# Patient Record
Sex: Male | Born: 1965
Health system: Southern US, Community
[De-identification: ages and names within clinical notes are randomized; demographics above are authoritative.]

## PROBLEM LIST (undated history)

## (undated) DIAGNOSIS — I2510 Atherosclerotic heart disease of native coronary artery without angina pectoris: Secondary | ICD-10-CM

## (undated) DIAGNOSIS — E669 Obesity, unspecified: Secondary | ICD-10-CM

## (undated) DIAGNOSIS — K219 Gastro-esophageal reflux disease without esophagitis: Secondary | ICD-10-CM

## (undated) DIAGNOSIS — R519 Headache, unspecified: Secondary | ICD-10-CM

## (undated) DIAGNOSIS — E119 Type 2 diabetes mellitus without complications: Secondary | ICD-10-CM

## (undated) DIAGNOSIS — E785 Hyperlipidemia, unspecified: Secondary | ICD-10-CM

## (undated) HISTORY — DX: Obesity, unspecified: E66.9

## (undated) HISTORY — DX: Atherosclerotic heart disease of native coronary artery without angina pectoris: I25.10

## (undated) HISTORY — DX: Type 2 diabetes mellitus without complications: E11.9

## (undated) HISTORY — PX: COLONOSCOPY: SHX174

## (undated) HISTORY — DX: Hyperlipidemia, unspecified: E78.5

---

## 1998-01-09 ENCOUNTER — Encounter: Admission: RE | Admit: 1998-01-09 | Discharge: 1998-04-09 | Payer: Self-pay | Admitting: Internal Medicine

## 2004-04-05 ENCOUNTER — Inpatient Hospital Stay (HOSPITAL_COMMUNITY): Admission: AD | Admit: 2004-04-05 | Discharge: 2004-04-09 | Payer: Self-pay | Admitting: Family Medicine

## 2004-04-05 ENCOUNTER — Ambulatory Visit: Payer: Self-pay | Admitting: Family Medicine

## 2004-04-24 ENCOUNTER — Ambulatory Visit: Payer: Self-pay | Admitting: Internal Medicine

## 2004-05-07 ENCOUNTER — Emergency Department (HOSPITAL_COMMUNITY): Admission: EM | Admit: 2004-05-07 | Discharge: 2004-05-07 | Payer: Self-pay | Admitting: Emergency Medicine

## 2004-06-21 ENCOUNTER — Ambulatory Visit: Payer: Self-pay | Admitting: Internal Medicine

## 2004-06-24 ENCOUNTER — Ambulatory Visit: Payer: Self-pay | Admitting: Internal Medicine

## 2004-07-16 ENCOUNTER — Ambulatory Visit: Payer: Self-pay | Admitting: Internal Medicine

## 2004-07-23 ENCOUNTER — Ambulatory Visit: Payer: Self-pay | Admitting: Internal Medicine

## 2004-07-29 ENCOUNTER — Ambulatory Visit: Payer: Self-pay | Admitting: Internal Medicine

## 2004-11-11 ENCOUNTER — Ambulatory Visit: Payer: Self-pay | Admitting: Internal Medicine

## 2004-11-19 ENCOUNTER — Ambulatory Visit: Payer: Self-pay | Admitting: Internal Medicine

## 2004-12-27 ENCOUNTER — Ambulatory Visit: Payer: Self-pay | Admitting: Internal Medicine

## 2005-04-17 ENCOUNTER — Ambulatory Visit: Payer: Self-pay | Admitting: Internal Medicine

## 2005-04-25 ENCOUNTER — Emergency Department (HOSPITAL_COMMUNITY): Admission: EM | Admit: 2005-04-25 | Discharge: 2005-04-25 | Payer: Self-pay | Admitting: Emergency Medicine

## 2005-04-25 ENCOUNTER — Ambulatory Visit: Payer: Self-pay | Admitting: Cardiology

## 2005-04-28 ENCOUNTER — Ambulatory Visit: Payer: Self-pay

## 2005-04-28 ENCOUNTER — Encounter: Payer: Self-pay | Admitting: Cardiology

## 2005-05-08 ENCOUNTER — Ambulatory Visit: Payer: Self-pay | Admitting: Cardiology

## 2005-05-14 ENCOUNTER — Ambulatory Visit: Payer: Self-pay

## 2005-08-13 ENCOUNTER — Ambulatory Visit: Payer: Self-pay | Admitting: Internal Medicine

## 2005-08-25 ENCOUNTER — Ambulatory Visit: Payer: Self-pay | Admitting: Cardiology

## 2007-01-13 ENCOUNTER — Ambulatory Visit: Payer: Self-pay | Admitting: Cardiology

## 2007-01-20 ENCOUNTER — Encounter: Payer: Self-pay | Admitting: Cardiology

## 2007-01-20 ENCOUNTER — Ambulatory Visit: Payer: Self-pay

## 2007-06-03 ENCOUNTER — Ambulatory Visit: Payer: Self-pay | Admitting: Cardiology

## 2007-10-26 DIAGNOSIS — E669 Obesity, unspecified: Secondary | ICD-10-CM | POA: Insufficient documentation

## 2010-07-23 ENCOUNTER — Encounter: Payer: Self-pay | Admitting: Cardiology

## 2010-07-25 ENCOUNTER — Ambulatory Visit (INDEPENDENT_AMBULATORY_CARE_PROVIDER_SITE_OTHER): Payer: BC Managed Care – PPO | Admitting: Cardiology

## 2010-07-25 ENCOUNTER — Encounter: Payer: Self-pay | Admitting: Cardiology

## 2010-07-25 DIAGNOSIS — Z794 Long term (current) use of insulin: Secondary | ICD-10-CM | POA: Insufficient documentation

## 2010-07-25 DIAGNOSIS — E781 Pure hyperglyceridemia: Secondary | ICD-10-CM

## 2010-07-25 DIAGNOSIS — E119 Type 2 diabetes mellitus without complications: Secondary | ICD-10-CM

## 2010-07-25 DIAGNOSIS — E785 Hyperlipidemia, unspecified: Secondary | ICD-10-CM | POA: Insufficient documentation

## 2010-07-25 DIAGNOSIS — I2510 Atherosclerotic heart disease of native coronary artery without angina pectoris: Secondary | ICD-10-CM

## 2010-07-25 MED ORDER — PRAVASTATIN SODIUM 20 MG PO TABS: 20.0000 mg | ORAL_TABLET | Freq: Every evening | ORAL | Status: DC

## 2010-07-25 NOTE — Patient Instructions (Signed)
Please start Pravastatin 20 mg once a day You are being scheduled for a treadmill test.  Please follow the instruction sheet given. Continue all other medications as listed

## 2010-07-25 NOTE — Assessment & Plan Note (Signed)
The patient needs exercise treadmill testing as it has been several years. This will allow me to rule out new obstructive coronary disease, risk stratify and give him a prescription for exercise.

## 2010-07-25 NOTE — Assessment & Plan Note (Signed)
He reports that his lipids were done by his primary doctor and his LDL was in the 90s. However, I did describe to him the benefits of a statin and I think he should still be on a low dose of pravastatin 20 mg daily for the results described in the Heart Protection Study.

## 2010-07-25 NOTE — Assessment & Plan Note (Signed)
His last hemoglobin A1c was 7.2 and followed by his primary provider. No change in therapy is indicated.

## 2010-07-25 NOTE — Progress Notes (Signed)
HPI The patient presents for followup of his coronary disease. It has been greater than 3 years. He last had a stress test in 2009. He had no ischemia at that time. He says he is done well until 3 weeks ago. He had what he thinks was a viral illness feeling very fatigued and sleeping. After taking a hot shower and felt presyncopal. He did notice some burning lung discomfort. Following this he had some mild episodes of presyncope but never had any frank syncope. He said this has resolved over the last week. He has not had any chest discomfort that had when he had stenting of his right coronary artery in 2006. He does yard work and walks 2-3 times per week. He does not bring on his chest discomfort, neck or arm discomfort. He hasn't had any palpitations. He has not really had any shortness of breath and denies any PND or orthopnea.  Allergies  Allergen Reactions  . Penicillins     Current Outpatient Prescriptions  Medication Sig Dispense Refill  . aspirin 325 MG tablet Take 325 mg by mouth daily.        . fenofibrate (TRICOR) 145 MG tablet Take 145 mg by mouth daily.        Marland Kitchen GLIMEPIRIDE PO Take by mouth. 8mg   daily       . metFORMIN (GLUCOPHAGE) 1000 MG tablet Take 1,000 mg by mouth 2 (two) times daily with a meal.        . Multiple Vitamin (MULTIVITAMIN) tablet Take 1 tablet by mouth daily.        . pioglitazone (ACTOS) 45 MG tablet Take 45 mg by mouth daily.        . sitaGLIPtan (JANUVIA) 100 MG tablet Take 100 mg by mouth daily.          Past Medical History  Diagnosis Date  . CAD (coronary artery disease)     Stent to RCA DES 2006  . Chest pain   . HLD (hyperlipidemia)   . Hypertriglyceridemia   . DM2 (diabetes mellitus, type 2)   . Obesity     History reviewed. No pertinent past surgical history.  Family History  Problem Relation Age of Onset  . Diabetes Mother   . Coronary artery disease Father 46    History   Social History  . Marital Status: Legally Separated   Spouse Name: N/A    Number of Children: N/A  . Years of Education: N/A   Occupational History  . Not on file.   Social History Main Topics  . Smoking status: Never Smoker   . Smokeless tobacco: Not on file  . Alcohol Use: Yes     rare  . Drug Use: No  . Sexually Active: Not on file   Other Topics Concern  . Not on file   Social History Narrative  . No narrative on file    ROS:  As stated in the HPI and negative for all other systems.   PHYSICAL EXAM BP 108/82  Pulse 94  Ht 5\' 5"  (1.651 m)  Wt 188 lb (85.276 kg)  BMI 31.28 kg/m2 GENERAL:  Well appearing HEENT:  Pupils equal round and reactive, fundi not visualized, oral mucosa unremarkable NECK:  No jugular venous distention, waveform within normal limits, carotid upstroke brisk and symmetric, no bruits, no thyromegaly LYMPHATICS:  No cervical, inguinal adenopathy LUNGS:  Clear to auscultation bilaterally BACK:  No CVA tenderness CHEST:  Unremarkable HEART:  PMI not displaced or sustained,S1 and  S2 within normal limits, no S3, no S4, no clicks, no rubs, no murmurs ABD:  Flat, positive bowel sounds normal in frequency in pitch, no bruits, no rebound, no guarding, no midline pulsatile mass, no hepatomegaly, no splenomegaly EXT:  2 plus pulses throughout, no edema, no cyanosis no clubbing SKIN:  No rashes no nodules NEURO:  Cranial nerves II through XII grossly intact, motor grossly intact throughout PSYCH:  Cognitively intact, oriented to person place and time  EKG:  Sinus rhythm, rate 82, axis within normal limits, intervals within normal limits, no acute ST-T wave changes.   ASSESSMENT AND PLAN

## 2010-08-06 NOTE — Assessment & Plan Note (Signed)
[facility]                            CARDIOLOGY OFFICE NOTE   NAME:Danny [name], Danny [name]                        MRN:          [id number]  DATE:01/13/2007                            DOB:          04-Jan-1966    PRIMARY CARE PHYSICIAN:  Danny [name], M.D.   REASON FOR PRESENTATION:  Evaluate patient with coronary disease and  chest discomfort.   HISTORY OF PRESENT ILLNESS:  Patient is a 45 year old gentleman with  coronary disease as described below.  He has had chest discomfort.  This  happened about a week ago.  He describes a substernal discomfort.  It  seems to be somewhat positional.  He has noticed it when he has been  sitting.  He has a fullness in his chest.  He has also had a chest  discomfort across the surface of his chest which is all new.  He does  not bring this on with activity.  He walks every morning and can bring  it on.  It is a vague discomfort.  It seems to be moderate.  There is no  radiation to his jaw or to his arms.  There is no associated nausea,  vomiting or diaphoresis.  He does get somewhat short of breath.  He has  noticed some episodes of dizziness too.  This is unrelated to the chest  discomfort.  This seems to happen every morning.  He has feeling like he  is nearly going to pass out but has not lost consciousness.  Other  complaints include his fingers tingling which may be related to arm  position and how he keeps his arms at his desk.  He has had some  cramping in his feet.  Because of the combination of all these problems,  he presents for follow-up.   He has seen Dr. Lynelle [name] recently and tells me his blood sugar has not been  well controlled.  He does say that his triglycerides have been elevated  but his total cholesterol is only 144.  He has had his diabetes  medicines recently adjusted with the addition of Januvia.  However, he  stopped taking it when he started getting this chest discomfort.   PAST MEDICAL  HISTORY:  1. Coronary artery disease (April 08, 2004, catheterization with      left main normal, LAD normal, circumflex normal, the right coronary      artery had an 80% stenosis which was treated with a CoStar study      stent or a Taxus drug eluting stent.  He is being managed in this      study).  2. Dyslipidemia.  3. Type 2 diabetes mellitus.  4. Obesity.   ALLERGIES:  AMPICILLIN.   MEDICATIONS:  1. Tylenol 25 mg daily.  2. Plavix 75 mg daily.  3. Aspirin 325 mg daily.  4. Glimepiride 4 mg daily.  5. Multivitamin.  6. Glucophage 1000 mg b.i.d.  7. Tricor 67 mg daily.  8. Lipitor 40 mg daily.   REVIEW OF SYSTEMS:  As stated in the HPI and otherwise negative for  other systems.   PHYSICAL EXAMINATION:  VITAL SIGNS:  Blood pressure 112/86, heart rate  66 and regular, weight 212 pounds, body mass index 33.  GENERAL APPEARANCE:  The patient is in no distress.  HEENT:  Eyelids unremarkable.  Pupils are equal, round and reactive to  light.  Fundi not visualized.  Oral mucosa unremarkable.  NECK:  No jugular venous distension at 45 degrees.  Carotid upstrokes  brisk and symmetric, no bruits, no thyromegaly.  LYMPHATICS:  No cervical, axillary or inguinal adenopathy.  CHEST:  Unremarkable.  LUNGS:  Clear to auscultation bilaterally.  BACK:  No costovertebral angle tenderness.  CARDIOVASCULAR:  PMI not displaced or sustained.  S1 and S2 within  normal limits.  No S3, no S4, no clicks, no rubs, no murmurs.  ABDOMEN:  Obese, positive bowel sounds, normal in frequency and pitch,  no bruits, no rebound, no guarding, no midline pulsatile mass, no  hepatomegaly, no splenomegaly.  SKIN:  No rashes, no nodules.  EXTREMITIES:  Pulses 2+, no edema.   EKG:  Sinus rhythm, rate 66, axis within normal limits, intervals within  normal limits, no acute STT wave changes.   ASSESSMENT/PLAN:  1. Chest discomfort:  The patient is having some vague chest      discomfort.  His symptoms when  he presented in 2006 were somewhat      less than classic.  Given this and his previous history, the      possibility of obstructive coronary disease is at least moderate.      Therefore, he needs to be screened with a stress perfusion study.      He will be able to walk on a treadmill, so he will have an exercise      Myoview.  2. Presyncope:  The patient is having this happen daily.  I have asked      him if he can check his blood sugar.  I also wanted him to see if      he could take a blood pressure when that is happening.  I doubt a      primary arrhythmia or other cardiac etiology.  3. Dyslipidemia:  This is being followed closely by Dr. Lynelle [name].  The      goal being LDL less than 70 and HDL greater than 40.  I will defer      to her unless suggested otherwise.  4. Obesity:  He understands that he needs to lose weight with diet and      exercise.  5. Follow-up:  I will see the patient again in four months or sooner      based on the results of his stress test.     Danny Rotunda, MD, [facility]  Electronically Signed    JH/MedQ  DD: 01/13/2007  DT: 01/14/2007  Job #: [id number]   cc:   Danny [name], M.D.

## 2010-08-06 NOTE — Assessment & Plan Note (Signed)
[facility]                            CARDIOLOGY OFFICE NOTE   NAME:Danny [name], Danny [name]                        MRN:          [id number]  DATE:06/03/2007                            DOB:          1966-01-14    PRIMARY:  None.   REASON FOR PRESENTATION:  Evaluate the patient for coronary disease.   HISTORY OF PRESENT ILLNESS:  The patient is a pleasant 45 year old  gentleman with coronary disease as described below.  At the last visit,  he did describe some chest discomfort.  He had a stress perfusion study  in October which demonstrated no evidence of ischemia or infarct and EF  of 72%.  He has had no further problems with chest discomfort.  He has  had a dry hacking cough.  Because of this, he went to an emergency room.  He was given some antibiotics.  His pharmacist suggested that he come  off his Lipitor while taking antibiotics.  When he stopped taking his,  he stopped having leg cramping, which was a significant problem.  He had  no muscle aches.  He has since remained off the Lipitor.  He has had no  chest pressure, neck or arm discomfort.  Had no palpitations,  presyncope, syncope.  Had no PND or orthopnea.  He does such activities  and as walking 3 flight of stairs without significant symptoms.   PAST MEDICAL HISTORY:  Coronary artery disease (April 08, 2004  catheterization was normal left main, LAD normal, circumflex normal, the  right coronary artery had 80% stenosis which was treated with a COSTAR  study with either a TAXUS stent or COSTAR study stent.  He is being  managed in this study.), dyslipidemia, type 2 diabetes mellitus,  obesity.   ALLERGIES:  PENICILLIN.   MEDICATIONS:  1. Atenolol 25 mg daily.  2. Plavix 75 mg daily.  3. Aspirin 325 mg daily.  4. Glimepiride 4 mg daily.  5. Multivitamin.  6. Januvia.  7. Glucophage 1000 mg b.i.d.  8. Tricor 67 mg daily.   REVIEW OF SYSTEMS:  As stated in the HPI, otherwise negative for  other  systems.   PHYSICAL EXAMINATION:  The patient is in no distress.  Blood pressure 120/73, heart rate 77 regular, weight 207 pounds, body  mass index 33.  HEENT:  Eyelids unremarkable.  Pupils are equal, round, and reactive to  light and accommodation.  Fundi are not visualized.  Oral mucosa  unremarkable.  NECK:  No jugular venous distension at 45 degrees, carotid upstroke  brisk and symmetric, no bruits, thyromegaly.  LYMPHATICS:  No cervical, axillary, or inguinal adenopathy.  LUNGS:  Clear to auscultation bilaterally.  BACK:  No costovertebral angle tenderness.  CHEST:  Unremarkable.  HEART:  PMI not displaced or sustained, S1 and S2 within normal limits,  no S3, no S4, no clicks, rubs, murmurs.  ABDOMEN:  Obese, positive bowel sounds, normal in frequency and pitch,  no bruits, rebound, guarding.  No midline pulsatile mass, hepatomegaly,  splenomegaly.  SKIN:  No rashes, no nodules.  EXTREMITIES:  With 2+ pulses throughout,  no edema, cyanosis, clubbing.  NEURO:  Oriented to person, place, and time, cranial nerves 2-12 grossly  intact, motor grossly intact.    EKG sinus rhythm, rate 72, axis within normal.  Intervals within normal.  No acute ST wave change.   ASSESSMENT/PLAN:  1. Coronary disease.  The patient is having no new symptoms.  No      further cardiovascular testing is suggested.  Will concentrate on      secondary risk reduction.  2. Dyslipidemia.  We had a long discussion about this.  The patient      will be given a prescription for Crestor.  He wants to get a      fasting lipid profile checked off of drugs first and have me review      this.  However, I think I convinced him of the benefits of a statin      in his situation.  He will keep the Crestor prescription and will      discuss starting this.  He understands the when he does start it,      he will need lipid and liver enzymes about 10 weeks later.  3. Obesity.  Understands the need to lose weight  with diet and      exercise, and that his body mass index 33 puts him in the obese      range.  4. Hypertension.  Blood pressure is well-controlled and he will      continue the medications as listed.  5. Diabetes.  He is establishing with a new primary care in [facility].  They will follow his diabetes.  6. Followup.  See the patient again in about 1 year or sooner and in      the meantime I will be evaluating the lipids and liver enzymes as      reported above.     [name], MD, [facility]  Electronically Signed    JH/MedQ  DD: 06/03/2007  DT: 06/04/2007  Job #: [id number]

## 2010-08-09 NOTE — Consult Note (Signed)
Danny [name], Danny [name]                 ACCOUNT NO.:  [id number]   MEDICAL RECORD NO.:  [id number]          PATIENT TYPE:  EMS   LOCATION:  [facility]                         FACILITY:  [facility]   PHYSICIAN:  [name], M.D.   DATE OF BIRTH:  [birth year]   DATE OF CONSULTATION:  04/25/2005  DATE OF DISCHARGE:                                   CONSULTATION   PRIMARY CARE PHYSICIAN:  Titus Dubin. Alwyn Ren, M.D. but will be Lavonda Jumbo  with [facility].   PRIMARY CARDIOLOGIST:  [name], M.D.   REASON FOR CONSULTATION:  This is a 45 year old white male with a prior  history of coronary artery disease, who presents for recurrent atypical  chest pain.   PROBLEM:  1.  Coronary artery disease/chest pain      1.  On April 08, 2004, a cardiac catheterization with left main          normal, LAD normal, left circumflex normal.  RCA with 80% mid.  The          patient was enrolled in the COSTAR study and was randomized to          either a COSTAR study stent or a Taxus drug-eluting stent.  The          stent he received was a 3.5 mm x 24 mm.  The ejection fraction was          60%.  2.  Hyperlipidemia.  3.  Hypertriglyceridemia.      1.  Last lipid profile on November 11, 2004, total cholesterol 180,          triglycerides 266, HDL 23, direct LDL 108.5.  4.  Hypertriglyceridemia.  5.  Type 2 diabetes mellitus.  6.  Obesity.   HISTORY OF PRESENT ILLNESS:  This 45 year old white male with a history of  coronary artery disease, status post PCI and stenting of the RCA on April 08, 2004, with a drug-eluting stent, enrolled in the COSTAR trial.  He was  in his usual state of health until approximately two weeks ago, when he  noted a cough with occasional episodes of a sensation that he could not get  his breath, with associated light-headedness lasting for a few minutes and  resolving spontaneously.  His symptoms typically occurred at rest while  driving and never occurred with  exertion.  He notes that he is able to walk  up and down 10 steps in his house without any problems.  On April 22, 2005,  he began to experience left-sided intermittent sharp shooting chest  pains lasting for two to three seconds and recurring every five minutes.  These pains occurred all day and did not limit his activities any.  He did  not have the discomfort on April 23, 2005, but decided to be seen by an  Urgent Care, where he had an electrocardiogram and chest x-ray, both of  which were okay.  He was given a prescription for his cough and sent home.  This a.m. while driving to get coffee, he developed 3-4/10  dull retrosternal  aching without associated symptoms, which was worse with twisting or leaning  forward.  He got to the coffee shop and got out of his car, walked into the  coffee shop without any worsening of discomfort.  He walked out of the  coffee shop and back into his truck, and then had a large belch, which was  associated with epigastric burning and then subsequently all of his symptoms  resolved.  He decided at that point to the emergency room for further  evaluation.  In the emergency room he is pain-free, with the exception of  when he leans forward, he has an epigastric discomfort, similar to what he  was having in his truck.  Cardiac markers are negative x3.  His  electrocardiogram shows no acute changes.   ALLERGIES:  No known drug allergies.   HOME MEDICATIONS:  1.  Aspirin 325 mg daily.  2.  Plavix 75 mg daily.  3.  Atenolol 25 mg daily.  4.  Avandia 8 mg daily.  5.  Tri-Chlor 145 mg daily.  6.  Multivitamin daily.  7.  Glimepiride 4 mg daily.  8.  Glucophage 1000 mg daily.   FAMILY HISTORY:  Mother is age 35, with diabetes.  Father is age 58, with a  history of diabetes and a coronary artery bypass graft surgery.  He has a  brother who is alive and well.   SOCIAL HISTORY:  He lives in [place] by himself.  He works as a Occupational psychologist.  He has never used cigarettes.  Occasionally drinks  alcohol.  He denies any use of drugs.  He was exercising, but stopped as the  weather turned cold in December.   REVIEW OF SYSTEMS:  Positive for chest pain, shortness of breath and  lightheadedness as well as anxiety.  All other systems reviewed and are  negative.   PHYSICAL EXAMINATION:  VITAL SIGNS:  Temperature 98.2 degrees, heart rate  64, respirations 20, blood pressure 102/85.  Pulse oximetry 98% on room air.  GENERAL:  A pleasant white male, in no acute distress, awake, alert and  oriented x3.  NECK:  Normal carotid upstrokes.  No bruits or jugular venous distention.  LUNGS:  Respirations regular and unlabored.  Clear to auscultation.  HEART:  Regular S1, S2.  No S3, S4 or murmurs.  ABDOMEN:  Round, soft, non-distended, nontender.  Bowel sounds present x4.  EXTREMITIES:  Warm and dry and patent with no clubbing, cyanosis or edema.  Dorsalis pedis and posterior tibial pulses 2+ and equal bilaterally.   ACCESSORY CLINICAL FINDINGS:  Chest x-ray showed no active cardiopulmonary  disease.  A chest CT was negative for pulmonary embolism or other acute  findings.   Electrocardiogram shows sinus rhythm with a rate of 60, normal axis.  There  are no acute ST-T changes.   LABORATORY DATA:  Hemoglobin 13.3, hematocrit 39.2.  Sodium 139, potassium  4.4, chloride 109, CO2 of 23.6, BUN 20, creatinine pending, glucose 124.  Cardiac markers:  CK-MB 1.5, 1.2, 1.2.  Troponin less than 0.05, less than  0.05, less than 0.05.  PTT 28, PT 12.7, INR 0.9.   ASSESSMENT/PLAN:  1.  Chest pain with a history of coronary artery disease:  His chest pain is      very atypical.  It is worse with positioning and leaving forward.  He is      now pain-free except for when leaning forward.  He is fairly anxious  regarding his history of coronary artery disease and feeling as though     any day could be my last.  Cardiac markers are  negative x3 since 8:30      a.m. this morning.  His electrocardiogram is without acute changes.  We      plan on discharge from the emergency department with outpatient function      study, set up for April 28, 2005, at 12:30 p.m.  He will then be seen      by Dr. [name] on May 08, 2005, at 4 p.m. in our office.  2.  Hyperlipidemia/hypertriglyceridemia:  He remains on Tri-Chlor therapy.      We have made a referral to our      [facility] and an appointment has been established for May 08, 2005, at 3:15 p.m.  3.  Type 2 diabetes mellitus:  The patient is planning on changing his      primary care physician to Dr. Lavonda Jumbo with [facility]      and hopes to follow up with a Dr. Talmage Nap there, with regards to his      endocrine issues.      Ok Anis, NP    ______________________________  [name], M.D.    CRB/MEDQ  D:  04/25/2005  T:  04/25/2005  Job:  [id number]   cc:   [name], M.D.  [address]  [address]  [place]  [place] [address]   Lavonda Jumbo, M.D.  Fax: [phone]   Dr. Talmage Nap

## 2010-08-09 NOTE — Cardiovascular Report (Signed)
NAMEJAMIEON, LANNEN                 ACCOUNT NO.:  [id number]   MEDICAL RECORD NO.:  [id number]          PATIENT TYPE:  INP   LOCATION:  3739                         FACILITY:  [facility]   PHYSICIAN:  Charlies Constable, M.D. [facility] DATE OF BIRTH:  12-10-1965   DATE OF PROCEDURE:  04/08/2004  DATE OF DISCHARGE:                              CARDIAC CATHETERIZATION   CLINICAL HISTORY:  Mr. 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  There is no prior history of  known heart disease, but does have diabetes, hyperlipidemia, and a positive  family history for coronary heart disease.  He recently developed exertional  dyspnea, chest tightness, and symptoms at rest, and was admitted to the  hospital by Dr. Alwyn Ren with unstable angina and seen in consultation by Dr.  Antoine Poche and scheduled for evaluation with angiography.   PROCEDURE:  The procedure was performed via the right femoral artery using  an arterial sheath and 6-French preformed  coronary catheters.  A front wall  arterial puncture was performed and Omnipaque contrast was used.  After  completion of the diagnostic study, we made a decision to proceed with  intervention of the right coronary artery.   The patient was enrolled in the COSTAR trial and was randomized to view the  COSTAR or Taxus stent.  He was given Angiomax bolus and infusion and was  given 300 mg of Plavix.  We used the [facility] guiding catheter with side holes  and  an Office manager.  We passed these in the mid right center with  the wire without difficulty.  We predilated with a 3.0 x 20 mm Maverick  performing one inflation up to eight atmospheres for 30 seconds.  We then  deployed a 3.5 x 24 mm COSTAR study stent employing this with one inflation  up to 14 atmospheres for 30 seconds.  We then pulse dilated with a 3.75 x 20  mm Quantum Maverick performing two inflations up to 16 atmospheres for 30  seconds.  Repeat dye  studies were then performed through the guiding  catheter.  We crossed the right ventricular branch, but did not compromise  the flow in this branch with the stent procedure.   RESULTS:  The left main coronary orifices and left main coronary artery was  free of significant disease.   The left anterior descending artery gave rise to two diagonal branches and a  septal perforator.  The other was irregular, but there was no significant  obstruction.   The circumflex artery gave rise to a marginal branch, atrial branch, and the  posterior lateral branch.  These vessels were free of significant disease.   The right coronary artery is a moderately large vessel, gave rise to a right  ventricular branch, posterior descending branch, and the posterior lateral  branch.  There was 80% narrowing in the mid proximal to midportion of the  right coronary artery just after the right ventricular branch with segmental  disease that covered about 20 mm which was less tight.  This vessel was free  of major obstruction.   The left ventriculogram performed in the RAO projection showed good wall  motion with no areas of hypokinesis.  The estimated ejection fraction was 60%.   Following stenting of the lesion in the mid-right coronary artery stenosis  improved from 80% to less than 10%.   CONCLUSION:  1.  Coronary artery disease with 80% narrowing in the mid-right coronary      artery, no significant obstruction in the left anterior descending      artery and circumflex artery with normal LV function.  2.  Successful stenting of the mid-right coronary artery lesion using a      COSTAR drug-eluting study stent with improvement in narrowing from 80%      to less than 10%.   DISPOSITION:  The patient was returned to the post angio suite for further  observation.  The patient does not follow-up angiography as part of the  COSTAR study stent, but will be followed clinically.   ADDENDUM:  The aortic  pressure was 117/76 with a mean of 92, left  ventricular pressure was 117/8.       BB/MEDQ  D:  04/08/2004  T:  04/08/2004  Job:  [id number]   cc:   Titus Dubin. Alwyn Ren, M.D. [facility]   [name], M.D.

## 2010-08-09 NOTE — Cardiovascular Report (Signed)
Danny [name], Danny [name]                 ACCOUNT NO.:  [id number]   MEDICAL RECORD NO.:  [id number]          PATIENT TYPE:  INP   LOCATION:  3735                         FACILITY:  [facility]   PHYSICIAN:  [name], M.D.   DATE OF BIRTH:  1965/04/24   DATE OF PROCEDURE:  DATE OF DISCHARGE:                              CARDIAC CATHETERIZATION   PRIMARY PHYSICIAN:  Marga Melnick, M.D.   REASON FOR CONSULTATION:  Evaluation of patient's chest pain.   HISTORY OF PRESENT ILLNESS:  The patient is a 45 year old gentleman with  multiple cardiovascular risk factors.  He had a negative stress perfusion  study he reports approximately seven or eight years ago.  Since then, he has  had no further cardiac evaluation.  He has had no cardiac symptoms.  He has  had ongoing risk factors with poor compliance for diabetes management with a  resultant hemoglobin A1C of 9.4.  He has had poor compliance with lipid  management with a cholesterol of 309, triglycerides 691, HDL 21, and an LDL  of 142.   Three nights ago, he noticed increasing shortness of breath while doing such  activities as climbing a flight of stairs.  This was distinctly different.  Around that same time, he started noticing chest pressure.  This would be  intermittent.  It was not like his previous reflux.  It was substernal.  It  would go away with rest.  It was mild.  There was no radiation to his neck  or to his arms.  There was no associated nausea, vomiting, or diaphoresis.  He did have dyspnea as described.  This morning, he awoke.  He thinks he  might have been startled by a sound.  He noticed the same kind of chest  pressure.  He saw Dr. Ruthine Dose today, who referred him to the hospital for  probable unstable angina.   The patient walks three times per week and the last time was a week ago  today.  He had no discomfort with this.  He tries to watch his diet.  He is  currently taking his medications and seems to be more in tune with  primary  risk reduction than he has been previously.   PAST MEDICAL HISTORY:  Diabetes x10 years (hemoglobin A1C 9.4), dyslipidemia  (cholesterol 209, triglycerides 691, HDL 21, and LDL of 142).   PAST SURGICAL HISTORY:  None.   ALLERGIES:  None.   CURRENT MEDICATIONS:  1.  Glucophage 1000 mg b.i.d.  2.  Amaryl 4 mg daily.  3.  Avandia 8 mg daily.  4.  Tricor 160 mg daily.  5.  Aspirin 325 mg daily.   SOCIAL HISTORY:  The patient is separated.  He has no children.  He has a  girlfriend.  He works in Scientist, research (physical sciences).  He was in the [employer].  He  never smoked cigarettes and occasionally drinks alcohol.   FAMILY HISTORY:  Remarkable for his father having a myocardial infarction  and CABG at age 25.   REVIEW OF SYSTEMS:  As stated in the HPI, positive for occasional reflux,  negative for all other systems.   PHYSICAL EXAMINATION:  He is in no distress.  Blood pressure 135/81, heart  rate 68 and regular, afebrile, 97% saturation on room air.  HEENT:  Eyes  unremarkable.  Pupils are equal, round, and reactive to light.  Fundi not  visualized.  Oral mucosa unremarkable.  Neck:  No jugular venous distention.  Waveform within normal limits, carotid upstroke brisk and symmetric, no  bruits, no thyromegaly.  Lymphatics:  No cervical, axillary, or inguinal  adenopathy.  Lungs:  Clear to auscultation bilaterally.  Back:  No  costovertebral angle tenderness.  Chest:  Unremarkable.  Heart:  PMI not  displaced or sustained, S1 and S2 within normal limits, no S3, no S4, no  murmurs.  Abdomen:  Obese, positive bowel sounds, normal in frequency.  No  bruits, no rebound, no guarding on midline.  No organomegaly or  splenomegaly.  Skin:  No rashes, no nodules.  Extremities:  2+ pulses  throughout, no edema, no cyanosis, no clubbing.  Neurologic:  Oriented to  person, place, and time.  Cranial nerves II-XII grossly intact, motor  grossly intact.   ELECTROCARDIOGRAM:  EKG:  Sinus rhythm, rate  65, axis within normal limits,  intervals within normal limits, no ST-T wave changes.   LABORATORY DATA:  Pending.  Chest x-ray pending.   ASSESSMENT/PLAN:  1.  Chest discomfort.  The patient's chest discomfort is worrisome for      unstable angina.  He has multiple cardiovascular risk factors.  He will      be admitted to the hospital and monitored on telemetry.  He will be      ruled out for myocardial infarction.  He will be treated with heparin      and beta blockers.  He will have elective cardiac catheterization unless      he has more urgent symptoms.  2. Diabetes.  He will continue on his oral      medications.  Will get a dietary consult.  He will be treated by his      primary care service.  3. Dyslipidemia per primary care.  However, given      his risk factors, we would suggest the addition of a statin to his      current regimen.  4. Obesity.  We talked about diet and exercise for      weight loss.       JH/MEDQ  D:  04/05/2004  T:  04/05/2004  Job:  [id number]

## 2010-08-09 NOTE — H&P (Signed)
Danny [name], Danny [name]                 ACCOUNT NO.:  [id number]   MEDICAL RECORD NO.:  [id number]          PATIENT TYPE:  INP   LOCATION:                               FACILITY:  [facility]   PHYSICIAN:  Angelena Sole, M.D. [facility] DATE OF BIRTH:  03-31-1965   DATE OF ADMISSION:  04/05/2004  DATE OF DISCHARGE:                                HISTORY & PHYSICAL   TIME:  8:00 a.m.   CHIEF COMPLAINT:  Chest pain, shortness of breath.  I think I'm having a  heart attack.   HISTORY OF PRESENT ILLNESS:  The patient is a 45 year old diabetic white  male who walked into the office at 7:45 a.m. this morning with the above  complaint.  For the past two to three days, he has had intermittent pressure  in his chest, more in the center lower part of his chest.  It does not  radiate.  No shortness of breath when it is happening.  Not sweating.  It  lasts a few minutes.  He can be at rest or if he is active, but at other  times, he has pain in his left neck and sometimes in his back.  Right now,  he is having pain.  He also had dyspnea on exertion for two days, and it is  only on exertion.  No cough, no fevers or chills, and he has been very  fatigued for past two days.  He has also had a headache for two to three  weeks, the left occipital area and right frontal area.  No other symptoms  with that, but that has resolved for the past several days, and he states  that his sugars are not good at all.  He took an aspirin about 3:00 a.m.  or 4:30 a.m. this morning, and he has been moving computers a lot at work.   PAST MEDICAL HISTORY:  1.  Type 2 diabetes.  2.  Hyperlipidemia.  3.  High triglycerides.  4.  Hyperhomocysteinemia.  5.  Gilbert's syndrome.   PAST SURGICAL HISTORY:  None.   ALLERGIES:  AMPICILLIN caused GI upset.   MEDICATIONS:  1.  Tricor 145 mg.  2.  Avandia 8 mg.  3.  Glucophage 1000 mg b.i.d.  4.  Aspirin 325 mg.  5.  Amaryl 4 mg.  6.  Neuro-PS over the counter.  7.  He is supposed to  be on a statin, but he is having insurance issues and      trouble getting it.   FAMILY HISTORY:  Positive for diabetes, colon cancer.  His dad had coronary  artery disease at age 41-ish.   REVIEW OF SYSTEMS:  Negative except as in HPI.   PHYSICAL EXAMINATION:  VITAL SIGNS:  Weight 200, pulse 64, respirations 12,  blood pressure 110/78, pulse oximetry 96-97%.  NECK:  Supple.  No JVD.  No carotid bruits.  No thyromegaly.  No  lymphadenopathy.  HEART:  Regular rate and rhythm.  S1, S2 positive.  No murmurs, rubs or  gallops.  No murmurs or pain with leaning forward.  LUNGS:  Clear to auscultation and percussion bilaterally.  CHEST:  There is no pain to palpation of his upper chest.  ABDOMEN:  Bowel sounds positive, soft.  Tender in his midepigastric region.  No pulsatile masses were felt.  He is slightly tender all over his belly.  RECTAL:  Good tone.  Brown stool.  Guaiac negative.  EXTREMITIES:  No edema.  Dorsalis pedis and radial pulses 2+ bilaterally.  NEURO:  Alert and oriented x 3.  SKIN:  Patient has some tattoos.   EKG:  Normal sinus rhythm with a rate of 65.  No acute ST/T wave changes.  Some mild inversions in lead III.   IMPRESSION:  1.  Chest pain, dyspnea on exertion and fatigue. Given patient's risks      factors which include uncontrolled diabetes, hyperlipidemia,      hyperhomocysteinemia, family history, we will admit the patient and rule      out myocardial infarction.  We will also placed him on a proton pump      inhibitor.  2.  Diabetes, uncontrolled on p.o. medications.  Due to the fact that he may      end up having to have some dye studies done, etc., we will hold his      Glucophage.  We will get diabetic educator to come in and evaluate the      patient and teach him and start him on Lantus.  We will continue his      Avandia for now and put him on a sliding-scale insulin.        ___________________________________________  Angelena Sole, M.D.  [facility]    AMK/MEDQ  D:  04/05/2004  T:  04/05/2004  Job:  [id number]

## 2010-08-09 NOTE — Discharge Summary (Signed)
Danny [name], Danny [name]                 ACCOUNT NO.:  [id number]   MEDICAL RECORD NO.:  [id number]          PATIENT TYPE:  INP   LOCATION:  6533                         FACILITY:  [facility]   PHYSICIAN:  Charlies Constable, M.D. [facility] DATE OF BIRTH:  October 10, 1965   DATE OF ADMISSION:  04/05/2004  DATE OF DISCHARGE:  04/09/2004                                 DISCHARGE SUMMARY   BRIEF HISTORY OF PRESENT ILLNESS:  This is a 45 year old male with no  previous history of coronary artery disease but multiple cardiac risk  factors including diabetes mellitus, elevated cholesterol and positive  family history for early coronary artery disease. The patient was admitted  to [facility] on April 05, 2004 with a two to three day  history of dyspnea on exertion as well as orthopnea.  He also noted some  anterior chest discomfort.  He was admitted for further evaluation.   PAST MEDICAL HISTORY:  Significant for negative exercise Cardiolite  approximately 8 years ago done in [place].  He does have diabetes X10  years, history of elevated cholesterol, positive family history of early  coronary artery disease.   ALLERGIES:  No known drug allergies.   MEDICATIONS:  Prior to admission included Glucophage, Amaryl, Avandia,  Tricor and aspirin.   SOCIAL HISTORY:  The patient lives in [place].  He is separated.  He  works in Scientist, research (physical sciences).  He has never been a smoker.  He drinks alcohol  occasionally.   FAMILY HISTORY:  His father is age 36.  He had coronary artery bypass  grafting surgery at age 15.  He also has diabetes.  His mother is age 9.  She does not have any coronary disease.  He has a brother, 48, with no  coronary disease.   HOSPITAL COURSE:  As noted, this patient was admitted to [facility]  with dyspnea and chest pain.  He was placed on intravenous heparin at the  time of admission as well as aspirin and Lopressor.   The patient was actually admitted by Dr. Ruthine Dose  and [facility] saw  the patient in consultation.   On April 08, 2004 the patient underwent cardiac catheterization performed  by Dr. Charlies Constable.  He was found to have mild irregularities in the left  anterior descending, the circumflex artery was okay, the right coronary  artery had an 80% proximal.  The left ventricle was normal with an ejection  fraction of 60%.   The patient underwent percutaneous transluminal coronary angioplasty  stenting of the right coronary artery reducing the lesion from 80% to less  than 10%.  The patient tolerated this well.  Arrangements were made to  discharge the patient home in stable and improved condition on April 09, 2004.   LABORATORY DATA:  On the day of discharge a CBC revealed hemoglobin of 12.7,  hematocrit 35.5, white blood cell count 7.6, platelet count 259,000.  Chemistries on the day of discharge revealed BUN of 12, creatinine 0.7,  potassium 3.6, sodium 134, glucose 142.  Cardiac enzymes were negative.  Hemoglobin A1C was  elevated at 9.6.  Stool for occult blood was negative.  A  lipid profile revealed cholesterol 367, triglycerides 1,388, HDL was 28, LDL  was not tested due to the elevated triglycerides.  A direct LDL was  suggested.  A chest x-ray showed no acute abnormalities.  Ultrasound of the  abdomen showed no significant abnormalities.  Liver function tests were  mildly elevated with AST of 45, ALT 61.  This did improve with the AST  improving to 36, ALT improving to 56.   DISCHARGE MEDICATIONS:  1.  Plavix 75 mg daily for at least six months.  2.  Coated aspirin 325 mg daily for at least 8 months.  3.  Nitroglycerin PRN.  4.  Amaryl 4 mg daily.  5.  Glucophage to be resumed tomorrow at his previous dosing.  6.  Tricor 145 mg daily.  7.  Atenolol 25 mg daily.  8.  Tylenol as needed for pain.   DISCHARGE INSTRUCTIONS:  The patient was told to avoid any heavy  lifting  for at least two days.  He is told he can  drive the day after discharge.  He  was told to stay on a low fat, low cholesterol diabetic diet.  He is told to  call the office if he has any increased pain, swelling or bleeding from his  groin.   The patient was enrolled on the Costar's stent study which recommends  aspirin for at least 8 months and Plavix for at least six months.   The patient is to follow up with Dr. Antoine Poche April 25, 2004 at 10:45 A.M.  He will call his primary care physician for a follow up appointment.   PROBLEM LIST AT TIME OF DISCHARGE:  1.  Status post percutaneous transluminal coronary angioplasty stenting of      the right coronary artery as described above, performed April 08, 2004      with no other significant lesions.  Ejection fraction is 60% at time of      catheterization.  2.  History of diabetes mellitus X10 years.  3.  Hyperlipidemia with significantly elevated triglycerides.  4.  Positive family history of early coronary artery disease.  5.  History of gastroesophageal reflux disease.  6.  Mildly low potassium supplemented prior to discharge.       DR/MEDQ  D:  04/09/2004  T:  04/09/2004  Job:  [id number]   cc:   Titus Dubin. Alwyn Ren, M.D. [facility]

## 2010-09-13 ENCOUNTER — Ambulatory Visit (INDEPENDENT_AMBULATORY_CARE_PROVIDER_SITE_OTHER): Payer: BC Managed Care – PPO | Admitting: Cardiology

## 2010-09-13 DIAGNOSIS — I2510 Atherosclerotic heart disease of native coronary artery without angina pectoris: Secondary | ICD-10-CM

## 2010-09-13 NOTE — Progress Notes (Signed)
Exercise Treadmill Test  Pre-Exercise Testing Evaluation Rhythm: normal sinus  Rate: 80   PR:  .15 QRS:  .08  QT:  .36 QTc: .42     Test  Exercise Tolerance Test Ordering MD: Angelina Sheriff, MD  Interpreting MD:  Angelina Sheriff, MD  Unique Test No: 1  Treadmill:  1  Indication for ETT: CAD  Contraindication to ETT: No   Stress Modality: exercise - treadmill  Cardiac Imaging Performed: non   Protocol: standard Bruce - maximal  Max BP:  157/72  Max MPHR (bpm):  176 85% MPR (bpm):  150  MPHR obtained (bpm):  153 % MPHR obtained:  87  Reached 85% MPHR (min:sec): 8:13 Total Exercise Time (min-sec):  9:00  Workload in METS:  10.1 Borg Scale: 17  Reason ETT Terminated:  desired heart rate attained    ST Segment Analysis At Rest: normal ST segments - no evidence of significant ST depression With Exercise: no evidence of significant ST depression  Other Information Arrhythmia:  No Angina during ETT:  absent (0) Quality of ETT:  diagnostic  ETT Interpretation:  normal - no evidence of ischemia by ST analysis  Comments: The patient had an excellent exercise tolerance.  There was no chest pain.  There was an appropriate level of dyspnea.  There were no arrhythmias, a normal heart rate response and normal BP response.  There were no ischemic ST T wave changes and a normal heart rate recovery.  Recommendations: Negative adequate ETT.  No further testing is indicated.  Based on the above I gave the patient a prescription for exercise.

## 2011-04-09 DIAGNOSIS — E119 Type 2 diabetes mellitus without complications: Secondary | ICD-10-CM | POA: Insufficient documentation

## 2012-05-06 ENCOUNTER — Ambulatory Visit: Payer: BC Managed Care – PPO | Admitting: Nurse Practitioner

## 2012-05-11 ENCOUNTER — Ambulatory Visit (INDEPENDENT_AMBULATORY_CARE_PROVIDER_SITE_OTHER): Payer: PRIVATE HEALTH INSURANCE | Admitting: Physician Assistant

## 2012-05-11 ENCOUNTER — Telehealth: Payer: Self-pay | Admitting: *Deleted

## 2012-05-11 ENCOUNTER — Encounter: Payer: Self-pay | Admitting: Physician Assistant

## 2012-05-11 ENCOUNTER — Encounter: Payer: Self-pay | Admitting: *Deleted

## 2012-05-11 VITALS — BP 128/86 | HR 92 | Ht 65.0 in | Wt 190.0 lb

## 2012-05-11 DIAGNOSIS — E119 Type 2 diabetes mellitus without complications: Secondary | ICD-10-CM

## 2012-05-11 DIAGNOSIS — I209 Angina pectoris, unspecified: Secondary | ICD-10-CM

## 2012-05-11 DIAGNOSIS — E785 Hyperlipidemia, unspecified: Secondary | ICD-10-CM

## 2012-05-11 DIAGNOSIS — I2510 Atherosclerotic heart disease of native coronary artery without angina pectoris: Secondary | ICD-10-CM

## 2012-05-11 DIAGNOSIS — I208 Other forms of angina pectoris: Secondary | ICD-10-CM

## 2012-05-11 LAB — CBC WITH DIFFERENTIAL/PLATELET
Basophils Relative: 0.3 % (ref 0.0–3.0)
Eosinophils Relative: 3.4 % (ref 0.0–5.0)
HCT: 41.8 % (ref 39.0–52.0)
Hemoglobin: 14.5 g/dL (ref 13.0–17.0)
Lymphocytes Relative: 30.6 % (ref 12.0–46.0)
Lymphs Abs: 3 10*3/uL (ref 0.7–4.0)
Monocytes Relative: 5.7 % (ref 3.0–12.0)
Neutro Abs: 5.9 10*3/uL (ref 1.4–7.7)
RBC: 4.83 Mil/uL (ref 4.22–5.81)
WBC: 9.9 10*3/uL (ref 4.5–10.5)

## 2012-05-11 LAB — BASIC METABOLIC PANEL
CO2: 24 mEq/L (ref 19–32)
Chloride: 102 mEq/L (ref 96–112)
Creatinine, Ser: 0.8 mg/dL (ref 0.4–1.5)
Potassium: 4.6 mEq/L (ref 3.5–5.1)
Sodium: 136 mEq/L (ref 135–145)

## 2012-05-11 LAB — PROTIME-INR
INR: 1 ratio (ref 0.8–1.0)
Prothrombin Time: 10.3 s (ref 10.2–12.4)

## 2012-05-11 MED ORDER — NITROGLYCERIN 0.4 MG SL SUBL: 0.4000 mg | SUBLINGUAL_TABLET | SUBLINGUAL | Status: DC | PRN

## 2012-05-11 NOTE — Patient Instructions (Addendum)
Your physician has requested that you have a cardiac catheterization LEFT HEART CATH WITH DR. HOCHREIN IN THE [facility] 05/19/12 @ 10 AM; DX 413.9, 414.01, 272.4. Cardiac catheterization is used to diagnose and/or treat various heart conditions. Doctors may recommend this procedure for a number of different reasons. The most common reason is to evaluate chest pain. Chest pain can be a symptom of coronary artery disease (CAD), and cardiac catheterization can show whether plaque is narrowing or blocking your heart's arteries. This procedure is also used to evaluate the valves, as well as measure the blood flow and oxygen levels in different parts of your heart. For further information please visit [email or web address] Please follow instruction sheet, as given.  Your physician recommends that you return for lab work in: TODAY PRE CATH LABS ; BMET, CBC W/DIFF, PT/INR  AN RX FOR NITROGLYCERIN HAS BEEN SENT IN TODAY AND YOU HAVE BEEN ADVISED AND EDUCATED AS TO HOW AND WHEN TO USE NTG

## 2012-05-11 NOTE — Progress Notes (Addendum)
[address] [place], [place]  [address] Phone: [phone], Fax:  [phone]  Date:  05/11/2012   ID:  KNOWLEDGE ESCANDON, DOB Mar 07, 1966, MRN [id number]  PCP:  Dr. Carlyle Dolly at [facility] in [place], [place]  Primary Cardiologist:  Dr. [name]     History of Present Illness: Danny [name] is a 47 y.o. male who returns for evaluation of chest pain.  He has a history of CAD, status post Costar study DES of the RCA in 2006, DM2, HL.  LHC 1/06: Mid RCA 80% (tx with 3.5x29mm COSTAR study DES), EF 60%.  Myoview 10/08:  No ischemia, EF 72%.  ETT 6/12:  Ex 9:00, no ischemic changes.   Not seen here since 2012.  He established with Dr. Ramond Marrow at [facility] b/c this was closer to his job.  However, he preferred to come back here.  He notes exertional chest burning for the last 3-4 weeks.  No associated symptoms.  No dyspnea.  He notes his discomfort worsens the more activity he does and resolves with rest promptly.  He denies rest symptoms.  Has not taken NTG.  He is planning on starting to hike [place] soon.    Wt Readings from Last 3 Encounters:  05/11/12 190 lb (86.183 kg)  07/25/10 188 lb (85.276 kg)     Past Medical History  Diagnosis Date  . CAD (coronary artery disease)     a. COSTAR study DES Stent to RCA 2006;  b. Myoview 10/08:  No ischemia, EF 72%.;  c. ETT 6/12:  Ex 9:00, no ischemic changes  . HLD (hyperlipidemia)     with high triglycerides;  managed by PCP  . DM2 (diabetes mellitus, type 2)   . Obesity     Current Outpatient Prescriptions  Medication Sig Dispense Refill  . ACCU-CHEK AVIVA PLUS test strip       . aspirin 81 MG tablet Take 81 mg by mouth daily.      Marland Kitchen atorvastatin (LIPITOR) 10 MG tablet       . GLIMEPIRIDE PO Take 4 mg by mouth 2 (two) times daily. 8mg   daily      . LANTUS SOLOSTAR 100 UNIT/ML injection Inject 45 Units into the skin daily.       . metFORMIN (GLUCOPHAGE) 1000 MG tablet Take 1,250 mg by  mouth 2 (two) times daily with a meal.       . Multiple Vitamin (MULTIVITAMIN) tablet Take 1 tablet by mouth daily.         No current facility-administered medications for this visit.    Allergies:    Allergies  Allergen Reactions  . Penicillins     Social History:  The patient  reports that he has never smoked. He does not have any smokeless tobacco history on file. He reports that  drinks alcohol. He reports that he does not use illicit drugs.   Family History:  The patient's family history includes Coronary artery disease (age of onset: 50) in his father and Diabetes in his mother.  ROS:  Please see the history of present illness.      All other systems reviewed and negative.   PHYSICAL EXAM: VS:  BP 128/86  Pulse 92  Ht 5\' 5"  (1.651 m)  Wt 190 lb (86.183 kg)  BMI 31.62 kg/m2  SpO2 98% Well nourished, well developed, in no acute distress HEENT: normal Neck: no JVD Vascular:  No carotid bruits; DP/PT 2+  bilat Cardiac:  normal S1, S2; RRR; no murmur Lungs:  clear to auscultation bilaterally, no wheezing, rhonchi or rales Abd: soft, nontender, no hepatomegaly Ext: no edema Skin: warm and dry Neuro:  CNs 2-12 intact, no focal abnormalities noted  EKG:  NSR, HR 70, normal axis, no ischemic changes      ASSESSMENT AND PLAN:  1. Exertional Angina:  He presents with symptoms c/w CCS Class II-III angina.  No rest symptoms.  He is a diabetic and has known CAD with prior RCA stenting.  He would like to increase activity by hiking [place] [employer] soon.  I have recommended proceeding with cardiac cath to further evaluate his symptoms.  I d/w Dr. [name] who also saw the patient and agreed.  Risks and benefits of cardiac catheterization have been discussed with the patient.  These include bleeding, infection, kidney damage, stroke, heart attack, death.  The patient understands these risks and is willing to proceed.  2. Coronary Artery Disease:  Continue ASA and statin.   Will provide Rx for NTG. 3. Hyperlipidemia:  Managed by PCP.  4. Diabetes Mellitus:  He will hold Metformin 24 hrs pre and 48 hrs post cath.  He will hold Glimepiride while NPO.  He may continue Lantus.   5. Disposition:  He will follow up post cath as directed.   Signed, Tereso Newcomer, PA-C  9:07 AM 05/11/2012     History and all data above reviewed.  Patient examined.  I agree with the findings as above.  He is having chest discomfort it isn't the burning discomfort. He is new in onset and occurring with activity. (Class III angina). He's not having any resting complaints. The patient exam reveals COR:RRR  ,  Lungs: Clear  ,  Abd: Positive bowel sounds, no rebound no guarding, Ext No edema  .  All available labs, radiology testing, previous records reviewed. Agree with documented assessment and plan. Chest pain consistent with new onset exertional angina increasing pattern.  High likelihood of obstructive CAD.  Non invasive testing is not indicated. Cardiac catheterization is indicated. The patient understands that risks included but are not limited to stroke (1 in 1000), death (1 in 1000), kidney failure [usually temporary] (1 in 500), bleeding (1 in 200), allergic reaction [possibly serious] (1 in 200).  The patient understands and agrees to proceed.   [name]  2:08 PM  05/11/2012

## 2012-05-11 NOTE — Telephone Encounter (Signed)
Message copied by Tarri Fuller on Tue May 11, 2012  4:54 PM ------      Message from: [place], [place] T      Created: Tue May 11, 2012  2:02 PM       Labs ok for cath      Continue with current treatment plan.      Tereso Newcomer, PA-C  2:02 PM 05/11/2012 ------

## 2012-05-11 NOTE — Telephone Encounter (Signed)
lmom labs ok for cath next week

## 2012-05-11 NOTE — H&P (Signed)
History and Physical  Date:  05/11/2012   ID:  Danny [name], DOB 1966/03/11, MRN [id number]  PCP:  Dr. Carlyle Dolly at [facility] in [place], [place]  Primary Cardiologist:  Dr. [name]     History of Present Illness: Danny [name] is a 47 y.o. male who returns for evaluation of chest pain.  He has a history of CAD, status post Costar study DES of the RCA in 2006, DM2, HL.  LHC 1/06: Mid RCA 80% (tx with 3.5x61mm COSTAR study DES), EF 60%.  Myoview 10/08:  No ischemia, EF 72%.  ETT 6/12:  Ex 9:00, no ischemic changes.   Not seen here since 2012.  He established with Dr. Ramond Marrow at [facility] b/c this was closer to his job.  However, he preferred to come back here.  He notes exertional chest burning for the last 3-4 weeks.  No associated symptoms.  No dyspnea.  He notes his discomfort worsens the more activity he does and resolves with rest promptly.  He denies rest symptoms.  Has not taken NTG.  He is planning on starting to [place] soon.    Wt Readings from Last 3 Encounters:  05/11/12 190 lb (86.183 kg)  07/25/10 188 lb (85.276 kg)     Past Medical History  Diagnosis Date  . CAD (coronary artery disease)     a. COSTAR study DES Stent to RCA 2006;  b. Myoview 10/08:  No ischemia, EF 72%.;  c. ETT 6/12:  Ex 9:00, no ischemic changes  . HLD (hyperlipidemia)     with high triglycerides;  managed by PCP  . DM2 (diabetes mellitus, type 2)   . Obesity     Current Outpatient Prescriptions  Medication Sig Dispense Refill  . ACCU-CHEK AVIVA PLUS test strip       . aspirin 81 MG tablet Take 81 mg by mouth daily.      Marland Kitchen atorvastatin (LIPITOR) 10 MG tablet       . GLIMEPIRIDE PO Take 4 mg by mouth 2 (two) times daily. 8mg   daily      . LANTUS SOLOSTAR 100 UNIT/ML injection Inject 45 Units into the skin daily.       . metFORMIN (GLUCOPHAGE) 1000 MG tablet Take 1,250 mg by mouth 2 (two) times daily with a meal.       . Multiple Vitamin (MULTIVITAMIN) tablet  Take 1 tablet by mouth daily.         No current facility-administered medications for this visit.    Allergies:    Allergies  Allergen Reactions  . Penicillins     Social History:  The patient  reports that he has never smoked. He does not have any smokeless tobacco history on file. He reports that  drinks alcohol. He reports that he does not use illicit drugs.   Family History:  The patient's family history includes Coronary artery disease (age of onset: 62) in his father and Diabetes in his mother.  ROS:  Please see the history of present illness.      All other systems reviewed and negative.   PHYSICAL EXAM: VS:  BP 128/86  Pulse 92  Ht 5\' 5"  (1.651 m)  Wt 190 lb (86.183 kg)  BMI 31.62 kg/m2  SpO2 98% Well nourished, well developed, in no acute distress HEENT: normal Neck: no JVD Vascular:  No carotid bruits; DP/PT 2+ bilat Cardiac:  normal S1, S2; RRR; no murmur Lungs:  clear to auscultation  bilaterally, no wheezing, rhonchi or rales Abd: soft, nontender, no hepatomegaly Ext: no edema Skin: warm and dry Neuro:  CNs 2-12 intact, no focal abnormalities noted  EKG:  NSR, HR 70, normal axis, no ischemic changes      ASSESSMENT AND PLAN:  1. Exertional Angina:  He presents with symptoms c/w CCS Class II-III angina.  No rest symptoms.  He is a diabetic and has known CAD with prior RCA stenting.  He would like to increase activity by hiking [place] [employer] soon.  I have recommended proceeding with cardiac cath to further evaluate his symptoms.  I d/w Dr. [name] who also saw the patient and agreed.  Risks and benefits of cardiac catheterization have been discussed with the patient.  These include bleeding, infection, kidney damage, stroke, heart attack, death.  The patient understands these risks and is willing to proceed.  2. Coronary Artery Disease:  Continue ASA and statin.  Will provide Rx for NTG. 3. Hyperlipidemia:  Managed by PCP.  4. Diabetes Mellitus:  He  will hold Metformin 24 hrs pre and 48 hrs post cath.  He will hold Glimepiride while NPO.  He may continue Lantus.   5. Disposition:  He will follow up post cath as directed.   Signed, Tereso Newcomer, PA-C  9:07 AM 05/11/2012     History and all data above reviewed.  Patient examined.  I agree with the findings as above.  He is having chest discomfort it isn't the burning discomfort. He is new in onset and occurring with activity. (Class III angina). He's not having any resting complaints. The patient exam reveals COR:RRR  ,  Lungs: Clear  ,  Abd: Positive bowel sounds, no rebound no guarding, Ext No edema  .  All available labs, radiology testing, previous records reviewed. Agree with documented assessment and plan. Chest pain consistent with new onset exertional angina increasing pattern.  High likelihood of obstructive CAD.  Non invasive testing is not indicated. Cardiac catheterization is indicated. The patient understands that risks included but are not limited to stroke (1 in 1000), death (1 in 1000), kidney failure [usually temporary] (1 in 500), bleeding (1 in 200), allergic reaction [possibly serious] (1 in 200).  The patient understands and agrees to proceed.   Danny [name]  2:08 PM  05/11/2012

## 2012-05-14 ENCOUNTER — Telehealth: Payer: Self-pay | Admitting: Cardiology

## 2012-05-14 NOTE — Telephone Encounter (Signed)
Reviewed results of labs with pt.

## 2012-05-14 NOTE — Telephone Encounter (Signed)
Pt is trying to get results of testing he has a procedure next week

## 2012-05-17 ENCOUNTER — Encounter: Payer: Self-pay | Admitting: Physician Assistant

## 2012-05-19 ENCOUNTER — Ambulatory Visit (HOSPITAL_COMMUNITY)
Admission: AD | Admit: 2012-05-19 | Discharge: 2012-05-20 | Disposition: A | Discharge status: Home / Self Care | Payer: 59 | Source: Ambulatory Visit | Attending: Cardiovascular Disease | Admitting: Cardiovascular Disease

## 2012-05-19 ENCOUNTER — Encounter (HOSPITAL_BASED_OUTPATIENT_CLINIC_OR_DEPARTMENT_OTHER)
Admission: RE | Disposition: A | Discharge status: Other Acute Inpatient Hospital | Payer: Self-pay | Source: Ambulatory Visit | Attending: Cardiovascular Disease

## 2012-05-19 ENCOUNTER — Encounter (HOSPITAL_COMMUNITY): Payer: Self-pay | Admitting: General Practice

## 2012-05-19 ENCOUNTER — Encounter (HOSPITAL_COMMUNITY)
Admission: AD | Disposition: A | Discharge status: Home / Self Care | Payer: Self-pay | Source: Ambulatory Visit | Attending: Cardiovascular Disease

## 2012-05-19 ENCOUNTER — Inpatient Hospital Stay (HOSPITAL_BASED_OUTPATIENT_CLINIC_OR_DEPARTMENT_OTHER)
Admission: RE | Admit: 2012-05-19 | Discharge: 2012-05-19 | Disposition: A | Discharge status: Other Acute Inpatient Hospital | Payer: 59 | Source: Ambulatory Visit | Attending: Cardiovascular Disease | Admitting: Cardiovascular Disease

## 2012-05-19 DIAGNOSIS — Z9861 Coronary angioplasty status: Secondary | ICD-10-CM | POA: Insufficient documentation

## 2012-05-19 DIAGNOSIS — E669 Obesity, unspecified: Secondary | ICD-10-CM | POA: Insufficient documentation

## 2012-05-19 DIAGNOSIS — I2510 Atherosclerotic heart disease of native coronary artery without angina pectoris: Secondary | ICD-10-CM

## 2012-05-19 DIAGNOSIS — E785 Hyperlipidemia, unspecified: Secondary | ICD-10-CM | POA: Insufficient documentation

## 2012-05-19 DIAGNOSIS — I209 Angina pectoris, unspecified: Secondary | ICD-10-CM | POA: Insufficient documentation

## 2012-05-19 DIAGNOSIS — E119 Type 2 diabetes mellitus without complications: Secondary | ICD-10-CM | POA: Insufficient documentation

## 2012-05-19 DIAGNOSIS — I208 Other forms of angina pectoris: Secondary | ICD-10-CM

## 2012-05-19 DIAGNOSIS — Z6831 Body mass index (BMI) 31.0-31.9, adult: Secondary | ICD-10-CM | POA: Insufficient documentation

## 2012-05-19 HISTORY — PX: CORONARY ANGIOPLASTY WITH STENT PLACEMENT: SHX49

## 2012-05-19 HISTORY — PX: PERCUTANEOUS CORONARY STENT INTERVENTION (PCI-S): SHX5485

## 2012-05-19 LAB — POCT ACTIVATED CLOTTING TIME: Activated Clotting Time: 442 seconds

## 2012-05-19 LAB — GLUCOSE, CAPILLARY: Glucose-Capillary: 189 mg/dL — ABNORMAL HIGH (ref 70–99)

## 2012-05-19 SURGERY — JV LEFT HEART CATHETERIZATION WITH CORONARY ANGIOGRAM

## 2012-05-19 SURGERY — PERCUTANEOUS CORONARY STENT INTERVENTION (PCI-S)
Anesthesia: LOCAL

## 2012-05-19 MED ORDER — ONDANSETRON HCL 4 MG/2ML IJ SOLN: 4.0000 mg | Freq: Four times a day (QID) | INTRAMUSCULAR | Status: DC | PRN

## 2012-05-19 MED ORDER — SODIUM CHLORIDE 0.9 % IJ SOLN: 3.0000 mL | INTRAMUSCULAR | Status: DC | PRN

## 2012-05-19 MED ORDER — HEPARIN (PORCINE) IN NACL 2-0.9 UNIT/ML-% IJ SOLN
INTRAMUSCULAR | Status: AC
  Filled 2012-05-19: qty 1000

## 2012-05-19 MED ORDER — OXYCODONE-ACETAMINOPHEN 5-325 MG PO TABS: 1.0000 | ORAL_TABLET | ORAL | Status: DC | PRN

## 2012-05-19 MED ORDER — CLOPIDOGREL BISULFATE 75 MG PO TABS
75.0000 mg | ORAL_TABLET | Freq: Every day | ORAL | Status: DC
  Administered 2012-05-20: 11:00:00 75 mg via ORAL
  Filled 2012-05-19: qty 1

## 2012-05-19 MED ORDER — SODIUM CHLORIDE 0.9 % IV SOLN
INTRAVENOUS | Status: DC
  Administered 2012-05-19: 09:00:00 via INTRAVENOUS

## 2012-05-19 MED ORDER — SODIUM CHLORIDE 0.9 % IJ SOLN: 3.0000 mL | Freq: Two times a day (BID) | INTRAMUSCULAR | Status: DC

## 2012-05-19 MED ORDER — BIVALIRUDIN 250 MG IV SOLR
INTRAVENOUS | Status: AC
  Filled 2012-05-19: qty 250

## 2012-05-19 MED ORDER — ASPIRIN 81 MG PO CHEW
324.0000 mg | CHEWABLE_TABLET | ORAL | Status: AC
Start: 2012-05-20 — End: 2012-05-19
  Administered 2012-05-19: 324 mg via ORAL

## 2012-05-19 MED ORDER — SODIUM CHLORIDE 0.9 % IV SOLN: 250.0000 mL | INTRAVENOUS | Status: DC | PRN

## 2012-05-19 MED ORDER — INSULIN GLARGINE 100 UNIT/ML ~~LOC~~ SOLN
45.0000 [IU] | Freq: Every day | SUBCUTANEOUS | Status: DC
  Administered 2012-05-19: 45 [IU] via SUBCUTANEOUS

## 2012-05-19 MED ORDER — MIDAZOLAM HCL 2 MG/2ML IJ SOLN
INTRAMUSCULAR | Status: AC
  Filled 2012-05-19: qty 2

## 2012-05-19 MED ORDER — ACETAMINOPHEN 325 MG PO TABS
650.0000 mg | ORAL_TABLET | ORAL | Status: DC | PRN
  Administered 2012-05-19: 650 mg via ORAL
  Filled 2012-05-19: qty 2

## 2012-05-19 MED ORDER — GLIMEPIRIDE 4 MG PO TABS
4.0000 mg | ORAL_TABLET | Freq: Every day | ORAL | Status: DC
  Filled 2012-05-19 (×2): qty 1

## 2012-05-19 MED ORDER — LIDOCAINE HCL (PF) 1 % IJ SOLN
INTRAMUSCULAR | Status: AC
  Filled 2012-05-19: qty 30

## 2012-05-19 MED ORDER — ASPIRIN 81 MG PO TABS: 81.0000 mg | ORAL_TABLET | Freq: Every day | ORAL | Status: DC

## 2012-05-19 MED ORDER — FENTANYL CITRATE 0.05 MG/ML IJ SOLN
INTRAMUSCULAR | Status: AC
  Filled 2012-05-19: qty 2

## 2012-05-19 MED ORDER — SODIUM CHLORIDE 0.9 % IV SOLN: INTRAVENOUS | Status: AC

## 2012-05-19 MED ORDER — MORPHINE SULFATE 2 MG/ML IJ SOLN: 2.0000 mg | INTRAMUSCULAR | Status: DC | PRN

## 2012-05-19 MED ORDER — CLOPIDOGREL BISULFATE 300 MG PO TABS
ORAL_TABLET | ORAL | Status: AC
  Filled 2012-05-19: qty 2

## 2012-05-19 MED ORDER — ASPIRIN EC 81 MG PO TBEC
81.0000 mg | DELAYED_RELEASE_TABLET | Freq: Every day | ORAL | Status: DC
  Filled 2012-05-19: qty 1

## 2012-05-19 MED ORDER — ATORVASTATIN CALCIUM 40 MG PO TABS
40.0000 mg | ORAL_TABLET | Freq: Every day | ORAL | Status: DC
  Administered 2012-05-19: 18:00:00 40 mg via ORAL
  Filled 2012-05-19 (×2): qty 1

## 2012-05-19 NOTE — Interval H&P Note (Signed)
History and Physical Interval Note:  05/19/2012 9:59 AM  Danny [name]  has presented today for surgery, with the diagnosis of cp  The various methods of treatment have been discussed with the patient and family. After consideration of risks, benefits and other options for treatment, the patient has consented to  Procedure(s): JV LEFT HEART CATHETERIZATION WITH CORONARY ANGIOGRAM (N/A) as a surgical intervention .  The patient's history has been reviewed, patient examined, no change in status, stable for surgery.  I have reviewed the patient's chart and labs.  Questions were answered to the patient's satisfaction.     Tonny Bollman

## 2012-05-19 NOTE — Progress Notes (Signed)
Utilization Review Completed Janecia Palau J. Nayleah Gamel, RN, BSN, NCM [phone]  

## 2012-05-19 NOTE — CV Procedure (Signed)
   CARDIAC CATH NOTE  Name: Danny [name] MRN: [id number] DOB: 1965-12-26  Procedure: PTCA and stenting of the RCA (overlapping DES)  Indication: CCS Class 3 angina, symptoms present less than 3 months. Diagnostic cath this am showed severe single vessel CAD of the RCA with severe in-stent restenosis and diffuse mid-vessel stenosis.  Procedural Details: The right groin was prepped, draped, and anesthetized with 1% lidocaine. There was an indwelling 4Fr sheath and this was changed out for a 6 Fr sheath. The patient was loaded with Plavix 600 mg.  Weight-based bivalirudin was given for anticoagulation. Once a therapeutic ACT was achieved, a 6 [place] JR4 guide catheter was inserted.  A Prowater coronary guidewire was used to cross the lesion.  The lesion was predilated with a 2.5x20 mm balloon.  The lesion was then stented with a 3.5x38 Promus drug-eluting stent.  This covered the significant stenosis in the mid/distal vessel. A second stent was placed in overlapping fashion (3.5x16 mm Promus DES). The stent was postdilated with a 3.75 noncompliant balloon to 18 atm throughout.  Following PCI, there was 0% residual stenosis and TIMI-3 flow. Final angiography confirmed an excellent result. The patient tolerated the procedure well. There were no immediate procedural complications.   Lesion Data: Vessel: RCA/mid Percent stenosis (pre): 90 TIMI-flow (pre):  3 Stent:  3.5x38 and 3.5x16 DES Percent stenosis (post): 0 TIMI-flow (post): 3  Conclusions: Successful PCI of the RCA with overlapping DES for treatment of severe diffuse disease with severe focal in-stent restenosis  Recommendations: ASA/plavix at least 12 months.  Tonny Bollman 05/19/2012, 12:01 PM

## 2012-05-19 NOTE — Progress Notes (Signed)
Site area: right groin  Site Prior to Removal:  Level 0  Pressure Applied For 25 MINUTES    Minutes Beginning at 1505  Manual:   yes  Patient Status During Pull:  AAO X4  Post Pull Groin Site:  Level 0  Post Pull Instructions Given:  yes  Post Pull Pulses Present:  yes  Dressing Applied:  yes  Comments:  Tolerated produre well

## 2012-05-19 NOTE — CV Procedure (Signed)
   Cardiac Catheterization Procedure Note  Name: [name] MRN: [id number] DOB: [birth year]  Procedure: Left Heart Cath, Selective Coronary Angiography, LV angiography  Indication: CCS Class 3 angina  Procedural details: The right groin was prepped, draped, and anesthetized with 1% lidocaine. Using modified Seldinger technique, a 4 French sheath was introduced into the right femoral artery. Standard Judkins catheters were used for coronary angiography and left ventriculography. Catheter exchanges were performed over a guidewire. There were no immediate procedural complications. The patient was transferred to the post catheterization recovery area for further monitoring.  Procedural Findings: Hemodynamics:  AO 13177 LV 13016   Coronary angiography: Coronary dominance: right  Left mainstem: Widely patent with very mild ostial stenosis of approximately 20%.  Left anterior descending (LAD): There is mild diffuse toxoid LAD stenosis extending beyond the second diagonal branch. This is estimated angiographically at 40-50%. There is a high first diagonal with 50% proximal stenosis. Further down in the mid and distal LAD, the vessel is of relatively small caliber with no significant stenosis.  Left circumflex (LCx): The left circumflex is patent throughout. The proximal vessel is widely patent. The mid vessel has diffuse 30-40% stenosis. The obtuse marginal branches are patent.  Right coronary artery (RCA): The RCA is heavily diseased. The proximal vessel has mild nonobstructive stenosis. The mid vessel has severe in-stent restenosis within the previously placed stent. Even beyond the stented segment there is 70% tubular stenosis. Within the stented segment there is focal 85-90% stenosis. In the distal RCA just before the bifurcation of the PDA and PLA branches there is a 50% stenosis at a hinge point in the vessel. The PDA has moderate diffuse 50% stenosis throughout its proximal aspect. The  PLA has mild diffuse 30-40% stenosis throughout its proximal aspect.  Left ventriculography: Left ventricular systolic function is normal, LVEF is estimated at 55-65%, there is no significant mitral regurgitation   Final Conclusions:   1. Severe single-vessel coronary artery disease with severe in-stent restenosis of the previously implanted drug-eluting stent and moderate diffuse stenosis throughout the mid vessel. 2. Diffuse nonobstructive stenosis of the LAD and left circumflex 3. Normal left ventricular systolic function  Recommendations: Considering the patient's class III anginal symptoms and severe disease throughout the right coronary artery, I have recommended PCI of the RCA for symptom relief.  Danny [name] 05/19/2012, 10:34 AM

## 2012-05-19 NOTE — H&P (Signed)
[address], [address]  [place], [place] [address]  Phone: [phone], Fax: [phone]  Date: 05/11/2012  ID: Danny [name], DOB 12-01-1965, MRN [id number]  PCP: Dr. Carlyle Dolly at [facility] in [place], [place]  Primary Cardiologist: Dr. [name]  History of Present Illness:  Danny [name] is a 47 y.o. male who returns for evaluation of chest pain.  He has a history of CAD, status post Costar study DES of the RCA in 2006, DM2, HL. LHC 1/06: Mid RCA 80% (tx with 3.5x52mm COSTAR study DES), EF 60%. Myoview 10/08: No ischemia, EF 72%. ETT 6/12: Ex 9:00, no ischemic changes.  Not seen here since 2012. He established with Dr. Ramond Marrow at [facility] b/c this was closer to his job. However, he preferred to come back here. He notes exertional chest burning for the last 3-4 weeks. No associated symptoms. No dyspnea. He notes his discomfort worsens the more activity he does and resolves with rest promptly. He denies rest symptoms. Has not taken NTG. He is planning on starting to hike [place] soon.  Wt Readings from Last 3 Encounters:   05/11/12  190 lb (86.183 kg)   07/25/10  188 lb (85.276 kg)    Past Medical History   Diagnosis  Date   .  CAD (coronary artery disease)      a. COSTAR study DES Stent to RCA 2006; b. Myoview 10/08: No ischemia, EF 72%.; c. ETT 6/12: Ex 9:00, no ischemic changes   .  HLD (hyperlipidemia)      with high triglycerides; managed by PCP   .  DM2 (diabetes mellitus, type 2)    .  Obesity     Current Outpatient Prescriptions   Medication  Sig  Dispense  Refill   .  ACCU-CHEK AVIVA PLUS test strip      .  aspirin 81 MG tablet  Take 81 mg by mouth daily.     Marland Kitchen  atorvastatin (LIPITOR) 10 MG tablet      .  GLIMEPIRIDE PO  Take 4 mg by mouth 2 (two) times daily. 8mg  daily     .  LANTUS SOLOSTAR 100 UNIT/ML injection  Inject 45 Units into the skin daily.     .  metFORMIN (GLUCOPHAGE) 1000 MG tablet  Take 1,250 mg by mouth 2 (two) times  daily with a meal.     .  Multiple Vitamin (MULTIVITAMIN) tablet  Take 1 tablet by mouth daily.      No current facility-administered medications for this visit.   Allergies:  Allergies   Allergen  Reactions   .  Penicillins    Social History: The patient reports that he has never smoked. He does not have any smokeless tobacco history on file. He reports that drinks alcohol. He reports that he does not use illicit drugs.  Family History: The patient's family history includes Coronary artery disease (age of onset: 55) in his father and Diabetes in his mother.  ROS: Please see the history of present illness. All other systems reviewed and negative.  PHYSICAL EXAM:  VS: BP 128/86  Pulse 92  Ht 5\' 5"  (1.651 m)  Wt 190 lb (86.183 kg)  BMI 31.62 kg/m2  SpO2 98%  Well nourished, well developed, in no acute distress  HEENT: normal  Neck: no JVD  Vascular: No carotid bruits; DP/PT 2+ bilat  Cardiac: normal S1, S2; RRR; no murmur  Lungs: clear to auscultation bilaterally, no wheezing, rhonchi  or rales  Abd: soft, nontender, no hepatomegaly  Ext: no edema  Skin: warm and dry  Neuro: CNs 2-12 intact, no focal abnormalities noted  EKG: NSR, HR 70, normal axis, no ischemic changes  ASSESSMENT AND PLAN:  1. Exertional Angina: He presents with symptoms c/w CCS Class II-III angina. No rest symptoms. He is a diabetic and has known CAD with prior RCA stenting. He would like to increase activity by hiking [place] [employer] soon. I have recommended proceeding with cardiac cath to further evaluate his symptoms. I d/w Dr. [name] who also saw the patient and agreed. Risks and benefits of cardiac catheterization have been discussed with the patient. These include bleeding, infection, kidney damage, stroke, heart attack, death. The patient understands these risks and is willing to proceed.  2. Coronary Artery Disease: Continue ASA and statin. Will provide Rx for NTG. 3. Hyperlipidemia: Managed by  PCP.  4. Diabetes Mellitus: He will hold Metformin 24 hrs pre and 48 hrs post cath. He will hold Glimepiride while NPO. He may continue Lantus.  5. Disposition: He will follow up post cath as directed.  Signed,  Tereso Newcomer, PA-C 9:07 AM 05/11/2012  History and all data above reviewed. Patient examined. I agree with the findings as above. He is having chest discomfort it isn't the burning discomfort. He is new in onset and occurring with activity. (Class III angina). He's not having any resting complaints. The patient exam reveals COR:RRR , Lungs: Clear , Abd: Positive bowel sounds, no rebound no guarding, Ext No edema . All available labs, radiology testing, previous records reviewed. Agree with documented assessment and plan. Chest pain consistent with new onset exertional angina increasing pattern. High likelihood of obstructive CAD. Non invasive testing is not indicated. Cardiac catheterization is indicated. The patient understands that risks included but are not limited to stroke (1 in 1000), death (1 in 1000), kidney failure [usually temporary] (1 in 500), bleeding (1 in 200), allergic reaction [possibly serious] (1 in 200). The patient understands and agrees to proceed. [name] 2:08 PM 05/11/2012   ADDENDUM 05/19/12: Pt underwent diagnostic cath showing severe stenosis of the RCA. PCI has been recommended for treatment of severe single vessel CAD. He was loaded with plavix 600 mg. Otherwise as above.  Tonny Bollman 05/19/2012 10:44 PM

## 2012-05-19 NOTE — Progress Notes (Signed)
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

## 2012-05-20 ENCOUNTER — Encounter (HOSPITAL_COMMUNITY): Payer: Self-pay | Admitting: Physician Assistant

## 2012-05-20 DIAGNOSIS — I200 Unstable angina: Secondary | ICD-10-CM

## 2012-05-20 LAB — POCT I-STAT GLUCOSE
Glucose, Bld: 166 mg/dL — ABNORMAL HIGH (ref 70–99)
Operator id: 221371

## 2012-05-20 LAB — GLUCOSE, CAPILLARY: Glucose-Capillary: 131 mg/dL — ABNORMAL HIGH (ref 70–99)

## 2012-05-20 MED ORDER — ATORVASTATIN CALCIUM 40 MG PO TABS: 40.0000 mg | ORAL_TABLET | Freq: Every day | ORAL | Status: DC

## 2012-05-20 MED ORDER — METFORMIN HCL 500 MG PO TABS: 250.0000 mg | ORAL_TABLET | Freq: Two times a day (BID) | ORAL | Status: DC

## 2012-05-20 MED ORDER — METFORMIN HCL 1000 MG PO TABS: 1000.0000 mg | ORAL_TABLET | Freq: Two times a day (BID) | ORAL | Status: DC

## 2012-05-20 MED ORDER — CLOPIDOGREL BISULFATE 75 MG PO TABS: 75.0000 mg | ORAL_TABLET | Freq: Every day | ORAL | Status: DC

## 2012-05-20 NOTE — Discharge Summary (Signed)
Discharge Summary   Patient ID: Danny [name] MRN: [id number], DOB/AGE: 47-May-1967 47 y.o. Admit date: 05/19/2012 D/C date:     05/20/2012  Primary Cardiologist: Hochrein  Primary Discharge Diagnoses:  1. CAD/unstable angina - PTCA/overlapping DES of RCA for severe focal ISR this admission, diffuse mid RCA dz & diffuse LAD/Cx dz - history COSTAR study DES Stent to RCA 2006 - statin increased this admission, will need to consider f/u labs 2. Diabetes mellitus 3. Hyperlipidemia 4. Obesity BMI 31.7  Hospital Course: Danny [name] is a 47 y/o M with history of CAD, status post Costar study DES of the RCA in 2006, DM2, HL who presented to the office 2/18 with CP. He was seen in the office on 05/11/12 which was the first time he'd been seen since 2012. He had established care with Dr. Ramond Marrow at [facility] which was closer to his job but preferred to come back here and reconnect care. He complained of chest burning for the past 3-4 weeks without associated symptoms. His symptoms were worse with activity and resolved with rest. EKG showed no acute changes. Symptoms were felt concerning for unstable angina. Cardiac cath was recommended. He underwent this procedure yesterday showing severe ISR of the RCA within the previously implanted drug-eluting stent and moderate diffuse stenosis throughout the mid vessel. He had diffuse nonobstructive stenosis of the LAD and left circumflex. EF was normal. He subsequently underwent successful overlapping DES placement to the RCA. Today he is feeling well. Will continue ASA, Plavix, and statin. He is not currently on BB due to low-normal BP; and HR has also been in the 60s at times - can reconsider as outpt. He works as an Psychiatric nurse and has already arranged for someone to be able to do any physical work he has to do for his job as he recovers. He may return to normal work in 1 week. Dr. Antoine Poche has seen and examined him and feels he is stable for discharge.  Discharge Vitals:  Blood pressure 115/72, pulse 67, temperature 97.9 F (36.6 C), temperature source Oral, resp. rate 16, height 5\' 5"  (1.651 m), weight 189 lb 14.1 oz (86.13 kg), SpO2 100.00%.  Labs: Lab Results  Component Value Date   WBC 9.9 05/11/2012   HGB 14.5 05/11/2012   HCT 41.8 05/11/2012   MCV 86.7 05/11/2012   PLT 246.0 05/11/2012   Diagnostic Studies/Procedures   1. Cardiac catheterization this admission, please see full report and above for summary.   Discharge Medications     Medication List    TAKE these medications       aspirin EC 81 MG tablet  Take 81 mg by mouth daily.     atorvastatin 40 MG tablet  Commonly known as:  LIPITOR  Take 1 tablet (40 mg total) by mouth daily.     clopidogrel 75 MG tablet  Commonly known as:  PLAVIX  Take 1 tablet (75 mg total) by mouth daily.     glimepiride 4 MG tablet  Commonly known as:  AMARYL  Take 4 mg by mouth 2 (two) times daily before a meal.     LANTUS SOLOSTAR 100 UNIT/ML injection  Generic drug:  insulin glargine  Inject 45 Units into the skin at bedtime.     metFORMIN 1000 MG tablet  Commonly known as:  GLUCOPHAGE  Take 1 tablet (1,000 mg total) by mouth 2 (two) times daily with a meal. Give with 250 mg to = 1250 mg dose  Start taking  on:  05/21/2012     metFORMIN 500 MG tablet  Commonly known as:  GLUCOPHAGE  Take 0.5 tablets (250 mg total) by mouth 2 (two) times daily with a meal. Give with 1000 mg to = 1250 mg dose  Start taking on:  05/21/2012     multivitamin tablet  Take 1 tablet by mouth daily.     nitroGLYCERIN 0.4 MG SL tablet  Commonly known as:  NITROSTAT  Place 1 tablet (0.4 mg total) under the tongue every 5 (five) minutes as needed for chest pain.      The pt's AVS outlines that he should not restart Metformin until evening of 2/28.  Disposition   The patient will be discharged in stable condition to home. Discharge Orders   Future Appointments Provider Department Dept Phone   06/03/2012 10:10 AM  Beatrice Lecher, PA [facility] Main Office [place]) [phone]   Future Orders Complete By Expires     Diet - low sodium heart healthy  As directed     Comments:      Diabetic Diet    Increase activity slowly  As directed     Comments:      No driving for 2 days. No lifting over 5 lbs for 1 week. No sexual activity for 1 week. You may return to normal work on 05/27/12 if you feel well and catheterization site is without any complications. Keep procedure site clean & dry. If you notice increased pain, swelling, bleeding or pus, call/return!  You may shower, but no soaking baths/hot tubs/pools for 1 week.      Follow-up Information   Follow up with Tereso Newcomer, PA. (06/03/12 at 10:10am)    Contact information:   [address] [place] [address] [phone]         Duration of Discharge Encounter: Greater than 30 minutes including physician and PA time.  Signed, Ronie Spies PA-C 05/20/2012, 8:53 AM

## 2012-05-20 NOTE — Progress Notes (Signed)
CARDIAC REHAB PHASE I   PRE:  Rate/Rhythm: 74 SR  BP:  Supine: 129/80  Sitting:   Standing:    SaO2:   MODE:  Ambulation: 1000 ft   POST:  Rate/Rhythem: 86 SR  BP:  Supine:   Sitting: 108/72  Standing:    SaO2:  0750-0915 Pt tolerated ambulation well without c/o of cp or SOB. VS stable Completed discharge education with pt and wife. He declines Outpt. CRP due to work schedule.  Beatrix Fetters

## 2012-05-20 NOTE — Progress Notes (Signed)
   SUBJECTIVE:  Chest pain last night improved with belching   PHYSICAL EXAM Filed Vitals:   05/19/12 1900 05/19/12 1934 05/19/12 2338 05/20/12 0412  BP: 132/81 132/81 107/69 115/72  Pulse: 68 91 74 67  Temp:  98.8 F (37.1 C) 97.8 F (36.6 C) 97.9 F (36.6 C)  TempSrc:  Oral Oral Oral  Resp:      Height:  5\' 5"  (1.651 m)    Weight:  189 lb 14.1 oz (86.13 kg)    SpO2: 99% 100%     General:  No distress Lungs:  Clear Heart:  RRR Abdomen:  Positive bowel sounds, no rebound no guarding Extremities:  Right leg without bleeding or bruising.  LABS: No results found for this basename: CKTOTAL, CKMB, CKMBINDEX, TROPONINI   Results for orders placed during the hospital encounter of 05/19/12 (from the past 24 hour(s))  POCT ACTIVATED CLOTTING TIME     Status: None   Collection Time    05/19/12 11:29 AM      Result Value Range   Activated Clotting Time 442    GLUCOSE, CAPILLARY     Status: None   Collection Time    05/19/12  3:41 PM      Result Value Range   Glucose-Capillary 83  70 - 99 mg/dL  GLUCOSE, CAPILLARY     Status: Abnormal   Collection Time    05/19/12  6:02 PM      Result Value Range   Glucose-Capillary 205 (*) 70 - 99 mg/dL  GLUCOSE, CAPILLARY     Status: Abnormal   Collection Time    05/19/12 11:32 PM      Result Value Range   Glucose-Capillary 189 (*) 70 - 99 mg/dL    Intake/Output Summary (Last 24 hours) at 05/20/12 4132 Last data filed at 05/19/12 1847  Gross per 24 hour  Intake    577 ml  Output    600 ml  Net    -23 ml    EKG:  NSR, rate 64, axis WNL, intervals WNL, no acute ST T wave changes. 05/20/2012   ASSESSMENT AND PLAN:  CAD:  Status post PCI of instent RCA stenosis.  OK to discharge today.  Continue Plavix.     Diabetes:  Managed per primary MD.  OK to discharge.  See me or Mr. Alben Spittle in two weeks.  [name] 05/20/2012 6:32 AM

## 2012-05-21 ENCOUNTER — Telehealth: Payer: Self-pay | Admitting: *Deleted

## 2012-05-21 NOTE — Telephone Encounter (Signed)
Called patient to check status/ post hospital. Patient states that he feels well without any problems.  Aware of follow up appointment and advised that he will get a reminder phone call from teleminder.

## 2012-06-03 ENCOUNTER — Encounter: Payer: Self-pay | Admitting: Physician Assistant

## 2012-06-03 ENCOUNTER — Ambulatory Visit (INDEPENDENT_AMBULATORY_CARE_PROVIDER_SITE_OTHER): Payer: 59 | Admitting: Physician Assistant

## 2012-06-03 VITALS — BP 114/58 | HR 64 | Ht 65.0 in | Wt 186.1 lb

## 2012-06-03 DIAGNOSIS — E785 Hyperlipidemia, unspecified: Secondary | ICD-10-CM

## 2012-06-03 DIAGNOSIS — I2510 Atherosclerotic heart disease of native coronary artery without angina pectoris: Secondary | ICD-10-CM

## 2012-06-03 NOTE — Progress Notes (Signed)
[address] [place], [place]  [address] Phone: [phone], Fax:  [phone]  Date:  06/03/2012   ID:  Danny [name], DOB 26-Feb-1966, MRN [id number]  PCP:  Dr. Carlyle Dolly at [facility] in [place], [place]  Primary Cardiologist:  Dr. [name]     History of Present Illness: Danny [name] is a 47 y.o. male who returns for follow up after recent cardiac cath and PCI.  He has a history of CAD, s/p Costar study DES of the RCA in 2006, DM2, HL.  Myoview 10/08:  No ischemia, EF 72%.  ETT 6/12:  Ex 9:00, no ischemic changes.  I saw him recently and he c/o symptoms c/w exertional angina.  We arranged cardiac cath.  LHC  05/19/12: oLM 20%, LAD 40-50%, pD1 50%, mCFX 30-40%, mRCA stent with severe ISR and 70% beyond the stent, dRCA 50%, pPDA 50%, pPLA 30-40%.  EF 55-65%.  PCI:  Overlapping Promus DES x2 to the mid RCA ISR.  Since d/c from the hospital, he is doing well and feeling much better.  The patient denies chest pain, shortness of breath, syncope, orthopnea, PND or significant pedal edema.    Labs (2/14):  K 4.6, creatinine 0.8, Hgb 14.5   Wt Readings from Last 3 Encounters:  06/03/12 186 lb 1.9 oz (84.423 kg)  05/19/12 189 lb 14.1 oz (86.13 kg)  05/19/12 190 lb (86.183 kg)     Past Medical History  Diagnosis Date  . CAD (coronary artery disease)     a. COSTAR study DES Stent to RCA 2006;  b. Myoview 10/08:  No ischemia, EF 72%.  c.  ETT 6/12:  Ex 9:00, no ischemic changes;  d. LHC  05/19/12: oLM 20%, LAD 40-50%, pD1 50%, mCFX 30-40%, mRCA stent with severe ISR and 70% beyond the stent, dRCA 50%, pPDA 50%, pPLA 30-40%.  EF 55-65%. =>  PCI:  Overlapping Promus DES x2 to the mid RCA ISR.  Marland Kitchen HLD (hyperlipidemia)     with high triglycerides;  managed by PCP  . DM2 (diabetes mellitus, type 2)   . Obesity     Current Outpatient Prescriptions  Medication Sig Dispense Refill  . aspirin EC 81 MG tablet Take 81 mg by mouth daily.      Marland Kitchen  atorvastatin (LIPITOR) 40 MG tablet Take 1 tablet (40 mg total) by mouth daily.  30 tablet  6  . clopidogrel (PLAVIX) 75 MG tablet Take 1 tablet (75 mg total) by mouth daily.  30 tablet  11  . glimepiride (AMARYL) 4 MG tablet Take 4 mg by mouth 2 (two) times daily before a meal.      . insulin glargine (LANTUS SOLOSTAR) 100 UNIT/ML injection Inject 45 Units into the skin at bedtime.      . metFORMIN (GLUCOPHAGE) 1000 MG tablet Take 1 tablet (1,000 mg total) by mouth 2 (two) times daily with a meal. Give with 250 mg to = 1250 mg dose      . metFORMIN (GLUCOPHAGE) 500 MG tablet Take 0.5 tablets (250 mg total) by mouth 2 (two) times daily with a meal. Give with 1000 mg to = 1250 mg dose      . Multiple Vitamin (MULTIVITAMIN) tablet Take 1 tablet by mouth daily.       . nitroGLYCERIN (NITROSTAT) 0.4 MG SL tablet Place 1 tablet (0.4 mg total) under the tongue every 5 (five) minutes as needed for chest pain.  25 tablet  3  No current facility-administered medications for this visit.    Allergies:    Allergies  Allergen Reactions  . Ampicillin     Digestive reaction at age 33  . Penicillins     Digestive reaction    Social History:  The patient  reports that he has never smoked. He has never used smokeless tobacco. He reports that  drinks alcohol. He reports that he does not use illicit drugs.   ROS:  Please see the history of present illness.  Denies any bleeding problems.    All other systems reviewed and negative.   PHYSICAL EXAM: VS:  BP 114/58  Pulse 64  Ht 5\' 5"  (1.651 m)  Wt 186 lb 1.9 oz (84.423 kg)  BMI 30.97 kg/m2 Well nourished, well developed, in no acute distress HEENT: normal Neck: no JVD Cardiac:  normal S1, S2; RRR; no murmur Lungs:  clear to auscultation bilaterally, no wheezing, rhonchi or rales Abd: soft, nontender, no hepatomegaly Ext: no edema; right groin without hematoma or bruit  Skin: warm and dry Neuro:  CNs 2-12 intact, no focal abnormalities noted  EKG:   NSR, HR 64, normal axis, no ischemic changes      ASSESSMENT AND PLAN:  1. Coronary Artery Disease:  Doing well after recent PCI to the RCA due to ISR. We discussed the importance of dual antiplatelet therapy.  Continue high dose statin.   2. Hyperlipidemia:  Managed by PCP. Lipitor increased to 40 mg QD.  He will have his Lipids checked with his PCP in May.   3. Disposition:  Follow up with Dr. [name] in 3 mos.    Luna Glasgow, PA-C  10:43 AM 06/03/2012

## 2012-06-03 NOTE — Patient Instructions (Addendum)
NO CHANGES WERE MADE TODAY  PLEASE FOLLOW UP WITH DR. HOCHREIN 08/25/12 @ 9:15

## 2012-08-25 ENCOUNTER — Ambulatory Visit (INDEPENDENT_AMBULATORY_CARE_PROVIDER_SITE_OTHER): Payer: BC Managed Care – PPO | Admitting: Cardiology

## 2012-08-25 ENCOUNTER — Encounter: Payer: Self-pay | Admitting: Cardiology

## 2012-08-25 VITALS — BP 130/90 | HR 72 | Ht 65.0 in | Wt 187.8 lb

## 2012-08-25 DIAGNOSIS — E781 Pure hyperglyceridemia: Secondary | ICD-10-CM

## 2012-08-25 DIAGNOSIS — E119 Type 2 diabetes mellitus without complications: Secondary | ICD-10-CM

## 2012-08-25 DIAGNOSIS — I2510 Atherosclerotic heart disease of native coronary artery without angina pectoris: Secondary | ICD-10-CM

## 2012-08-25 DIAGNOSIS — Z79899 Other long term (current) drug therapy: Secondary | ICD-10-CM

## 2012-08-25 LAB — HEPATIC FUNCTION PANEL
AST: 25 U/L (ref 0–37)
Albumin: 4.2 g/dL (ref 3.5–5.2)

## 2012-08-25 LAB — LIPID PANEL
Cholesterol: 133 mg/dL (ref 0–200)
Triglycerides: 296 mg/dL — ABNORMAL HIGH (ref 0.0–149.0)
VLDL: 59.2 mg/dL — ABNORMAL HIGH (ref 0.0–40.0)

## 2012-08-25 NOTE — Progress Notes (Signed)
HPI The patient returns for followup of coronary disease. Since I last saw him he has been doing well. He denies any ongoing chest pressure, neck or arm discomfort. He has had no palpitations, presyncope or syncope. He has had no PND or orthopnea. He's had no weight gain or edema. Of note he has been hiking. He has a little bit of muscle aches in his legs he thinks related to his statin. However, he's having no symptoms he had before.  Allergies  Allergen Reactions  . Ampicillin     Digestive reaction at age 47  . Penicillins     Digestive reaction    Current Outpatient Prescriptions  Medication Sig Dispense Refill  . aspirin EC 81 MG tablet Take 81 mg by mouth daily.      Marland Kitchen atorvastatin (LIPITOR) 40 MG tablet Take 1 tablet (40 mg total) by mouth daily.  30 tablet  6  . clopidogrel (PLAVIX) 75 MG tablet Take 1 tablet (75 mg total) by mouth daily.  30 tablet  11  . glimepiride (AMARYL) 4 MG tablet Take 4 mg by mouth 2 (two) times daily before a meal.      . insulin glargine (LANTUS SOLOSTAR) 100 UNIT/ML injection Inject 45 Units into the skin at bedtime.      . metFORMIN (GLUCOPHAGE) 1000 MG tablet Take 1 tablet (1,000 mg total) by mouth 2 (two) times daily with a meal. Give with 250 mg to = 1250 mg dose      . metFORMIN (GLUCOPHAGE) 500 MG tablet Take 0.5 tablets (250 mg total) by mouth 2 (two) times daily with a meal. Give with 1000 mg to = 1250 mg dose      . Multiple Vitamin (MULTIVITAMIN) tablet Take 1 tablet by mouth daily.       . nitroGLYCERIN (NITROSTAT) 0.4 MG SL tablet Place 1 tablet (0.4 mg total) under the tongue every 5 (five) minutes as needed for chest pain.  25 tablet  3   No current facility-administered medications for this visit.    Past Medical History  Diagnosis Date  . CAD (coronary artery disease)     a. COSTAR study DES Stent to RCA 2006;  b. Myoview 10/08:  No ischemia, EF 72%.  c.  ETT 6/12:  Ex 9:00, no ischemic changes;  d. LHC  05/19/12: oLM 20%, LAD  40-50%, pD1 50%, mCFX 30-40%, mRCA stent with severe ISR and 70% beyond the stent, dRCA 50%, pPDA 50%, pPLA 30-40%.  EF 55-65%. =>  PCI:  Overlapping Promus DES x2 to the mid RCA ISR.  Marland Kitchen HLD (hyperlipidemia)     with high triglycerides;  managed by PCP  . DM2 (diabetes mellitus, type 2)   . Obesity     Past Surgical History  Procedure Laterality Date  . Coronary angioplasty with stent placement  05/19/2012    DES   to RCA   by Dr Excell Seltzer    ROS:  As stated in the HPI and negative for all other systems.  PHYSICAL EXAM BP 130/90  Pulse 72  Ht 5\' 5"  (1.651 m)  Wt 187 lb 12.8 oz (85.186 kg)  BMI 31.25 kg/m2 GENERAL:  Well appearing HEENT:  Pupils equal round and reactive, fundi not visualized, oral mucosa unremarkable NECK:  No jugular venous distention, waveform within normal limits, carotid upstroke brisk and symmetric, questionable left soft bruit, no thyromegaly LYMPHATICS:  No cervical, inguinal adenopathy LUNGS:  Clear to auscultation bilaterally BACK:  No CVA tenderness CHEST:  Unremarkable HEART:  PMI not displaced or sustained,S1 and S2 within normal limits, no S3, no S4, no clicks, no rubs, no murmurs ABD:  Flat, positive bowel sounds normal in frequency in pitch, no bruits, no rebound, no guarding, no midline pulsatile mass, no hepatomegaly, no splenomegaly EXT:  2 plus pulses throughout, no edema, no cyanosis no clubbing SKIN:  No rashes no nodules NEURO:  Cranial nerves II through XII grossly intact, motor grossly intact throughout PSYCH:  Cognitively intact, oriented to person place and time  EKG:  Sinus rhythm, rate 72, axis within normal limits, intervals within normal limits, no acute ST-T wave changes.  ASSESSMENT AND PLAN  CAD:  The patient has no new sypmtoms.  No further cardiovascular testing is indicated.  We will continue with aggressive risk reduction and meds as listed.  HYPERLIPIDEMIA:  He's having some leg cramping. However, I would very much like him to  continue the current statin. I will be checking a lipid profile today.  I suggested adding coenzyme Q with a muscle aches.  CAROTID BRUIT:  I will check a Doppler

## 2012-08-25 NOTE — Patient Instructions (Addendum)
The current medical regimen is effective;  continue present plan and medications.  Please have blood work today (lipid, hepatic and HA1C)  Your physician has requested that you have a carotid duplex. This test is an ultrasound of the carotid arteries in your neck. It looks at blood flow through these arteries that supply the brain with blood. Allow one hour for this exam. There are no restrictions or special instructions.  Follow up in 6 months with Dr Antoine Poche.  You will receive a letter in the mail 2 months before you are due.  Please call us when you receive this letter to schedule your follow up appointment.

## 2012-08-31 ENCOUNTER — Encounter: Payer: Self-pay | Admitting: *Deleted

## 2012-09-06 ENCOUNTER — Encounter (INDEPENDENT_AMBULATORY_CARE_PROVIDER_SITE_OTHER): Payer: BC Managed Care – PPO

## 2012-09-06 DIAGNOSIS — R0989 Other specified symptoms and signs involving the circulatory and respiratory systems: Secondary | ICD-10-CM

## 2012-09-06 DIAGNOSIS — I6529 Occlusion and stenosis of unspecified carotid artery: Secondary | ICD-10-CM

## 2012-09-06 DIAGNOSIS — I2510 Atherosclerotic heart disease of native coronary artery without angina pectoris: Secondary | ICD-10-CM

## 2013-07-21 ENCOUNTER — Ambulatory Visit (INDEPENDENT_AMBULATORY_CARE_PROVIDER_SITE_OTHER): Payer: BC Managed Care – PPO | Admitting: Cardiology

## 2013-07-21 ENCOUNTER — Encounter: Payer: Self-pay | Admitting: Cardiology

## 2013-07-21 ENCOUNTER — Telehealth: Payer: Self-pay | Admitting: Cardiology

## 2013-07-21 VITALS — BP 120/80 | HR 72 | Ht 65.0 in | Wt 183.0 lb

## 2013-07-21 DIAGNOSIS — R079 Chest pain, unspecified: Secondary | ICD-10-CM

## 2013-07-21 DIAGNOSIS — I2510 Atherosclerotic heart disease of native coronary artery without angina pectoris: Secondary | ICD-10-CM

## 2013-07-21 NOTE — Telephone Encounter (Signed)
New Message  Pt called states he is experiencing SOB usually happening at night and with Exertion. Sent call to triage and made same day appt//SR

## 2013-07-21 NOTE — Progress Notes (Signed)
HPI The patient returns for followup of coronary disease. The patient presents for evaluation of some vague chest discomfort. He says that this started recently. He been trying to do some walking routinely. He had taken some over-the-counter neutraceutical that he read was good for diabetes. He stop taking his aspirin because he read that wasn't good for him. He reports that he started having some vague sensation like he been holding his breath for to long period this would happen with moderate activity. He thinks this has been happening for the last 3 or 4 days and now he is afraid to do any of the walking. He's not sure whether this is similar to his previous angina. He has a little more dyspnea than usual. He's quite anxious about all of this. He denies any palpitations, presyncope or syncope. He has no PND or orthopnea.  Allergies  Allergen Reactions  . Ampicillin     Digestive reaction at age 48  . Penicillins     Digestive reaction    Current Outpatient Prescriptions  Medication Sig Dispense Refill  . atorvastatin (LIPITOR) 20 MG tablet Take 20 mg by mouth daily.      . clopidogrel (PLAVIX) 75 MG tablet Take 1 tablet (75 mg total) by mouth daily.  30 tablet  11  . glimepiride (AMARYL) 4 MG tablet Take 4 mg by mouth 2 (two) times daily before a meal.      . insulin glargine (LANTUS SOLOSTAR) 100 UNIT/ML injection Inject 45 Units into the skin at bedtime.      . metFORMIN (GLUCOPHAGE) 1000 MG tablet Take 1 tablet (1,000 mg total) by mouth 2 (two) times daily with a meal. Give with 250 mg to = 1250 mg dose      . metFORMIN (GLUCOPHAGE) 500 MG tablet Take 0.5 tablets (250 mg total) by mouth 2 (two) times daily with a meal. Give with 1000 mg to = 1250 mg dose      . Multiple Vitamin (MULTIVITAMIN) tablet Take 1 tablet by mouth daily.       . nitroGLYCERIN (NITROSTAT) 0.4 MG SL tablet Place 1 tablet (0.4 mg total) under the tongue every 5 (five) minutes as needed for chest pain.  25 tablet  3    No current facility-administered medications for this visit.    Past Medical History  Diagnosis Date  . CAD (coronary artery disease)     a. COSTAR study DES Stent to RCA 2006;  b. Myoview 10/08:  No ischemia, EF 72%.  c.  ETT 6/12:  Ex 9:00, no ischemic changes;  d. LHC  05/19/12: oLM 20%, LAD 40-50%, pD1 50%, mCFX 30-40%, mRCA stent with severe ISR and 70% beyond the stent, dRCA 50%, pPDA 50%, pPLA 30-40%.  EF 55-65%. =>  PCI:  Overlapping Promus DES x2 to the mid RCA ISR.  Marland Kitchen. HLD (hyperlipidemia)     with high triglycerides;  managed by PCP  . DM2 (diabetes mellitus, type 2)   . Obesity     Past Surgical History  Procedure Laterality Date  . Coronary angioplasty with stent placement  05/19/2012    DES   to RCA   by Dr Excell Seltzerooper    ROS:  As stated in the HPI and negative for all other systems.  PHYSICAL EXAM BP 120/80  Pulse 72  Ht 5\' 5"  (1.651 m)  Wt 183 lb (83.008 kg)  BMI 30.45 kg/m2 GENERAL:  Well appearing HEENT:  Pupils equal round and reactive, fundi not visualized,  oral mucosa unremarkable NECK:  No jugular venous distention, waveform within normal limits, carotid upstroke brisk and symmetric, questionable left soft bruit, no thyromegaly LYMPHATICS:  No cervical, inguinal adenopathy LUNGS:  Clear to auscultation bilaterally BACK:  No CVA tenderness CHEST:  Unremarkable HEART:  PMI not displaced or sustained,S1 and S2 within normal limits, no S3, no S4, no clicks, no rubs, no murmurs ABD:  Flat, positive bowel sounds normal in frequency in pitch, no bruits, no rebound, no guarding, no midline pulsatile mass, no hepatomegaly, no splenomegaly EXT:  2 plus pulses throughout, no edema, no cyanosis no clubbing SKIN:  No rashes no nodules NEURO:  Cranial nerves II through XII grossly intact, motor grossly intact throughout PSYCH:  Cognitively intact, oriented to person place and time  EKG:  Sinus rhythm, rate 72, axis within normal limits, intervals within normal limits, no  acute ST-T wave changes.  ASSESSMENT AND PLAN  CAD:  It is difficult to understand whether his symptoms are related to his coronary disease. There is somewhat atypical. The pretest probability of obstructive coronary disease is moderate. I would not think that the sensitivity or specificity of ETT would make this the preferred test.  I would like to have an exercise perfusion study. He will restart aspirin which he stopped on his own as he read that it was not good for his situation. We discussed the controversy of DAPT.  However, I think given his past history and symptoms he should restart 81 mg of aspirin   CAROTID BRUIT:  I will check a Doppler in one year. He had mild stenosis.

## 2013-07-21 NOTE — Telephone Encounter (Signed)
I spoke with the pt and he denies chest pain.  The pt states that he has been walking 2 miles on a daily basis. When the pt walked on Monday he noticed that he was SOB and was having difficulty catching his breath.  The pt has noticed SOB with exertion and at night.  The pt does not have a history of CHF. I have scheduled the pt to see Dr Antoine PocheHochrein today at 12:00.

## 2013-07-21 NOTE — Patient Instructions (Signed)
Your physician has requested that you have a lexiscan myoview. For further information please visit [email or web address] Please follow instruction sheet, as given.  Your physician wants you to follow-up in: 1 YEAR with Dr Antoine PocheHochrein.  You will receive a reminder letter in the mail two months in advance. If you don't receive a letter, please call our office to schedule the follow-up appointment.  Your physician recommends that you continue on your current medications as directed. Please refer to the Current Medication list given to you today.

## 2013-07-22 ENCOUNTER — Emergency Department (HOSPITAL_COMMUNITY)
Admission: EM | Admit: 2013-07-22 | Discharge: 2013-07-22 | Disposition: A | Discharge status: Home / Self Care | Payer: BC Managed Care – PPO | Attending: Emergency Medicine | Admitting: Emergency Medicine

## 2013-07-22 ENCOUNTER — Emergency Department (HOSPITAL_COMMUNITY): Payer: BC Managed Care – PPO

## 2013-07-22 ENCOUNTER — Telehealth (HOSPITAL_COMMUNITY): Payer: Self-pay

## 2013-07-22 ENCOUNTER — Encounter (HOSPITAL_COMMUNITY): Payer: Self-pay | Admitting: Emergency Medicine

## 2013-07-22 DIAGNOSIS — Z9861 Coronary angioplasty status: Secondary | ICD-10-CM | POA: Insufficient documentation

## 2013-07-22 DIAGNOSIS — Z79899 Other long term (current) drug therapy: Secondary | ICD-10-CM | POA: Insufficient documentation

## 2013-07-22 DIAGNOSIS — Z880 Allergy status to penicillin: Secondary | ICD-10-CM | POA: Insufficient documentation

## 2013-07-22 DIAGNOSIS — I2510 Atherosclerotic heart disease of native coronary artery without angina pectoris: Secondary | ICD-10-CM | POA: Insufficient documentation

## 2013-07-22 DIAGNOSIS — E785 Hyperlipidemia, unspecified: Secondary | ICD-10-CM | POA: Insufficient documentation

## 2013-07-22 DIAGNOSIS — E669 Obesity, unspecified: Secondary | ICD-10-CM | POA: Insufficient documentation

## 2013-07-22 DIAGNOSIS — E119 Type 2 diabetes mellitus without complications: Secondary | ICD-10-CM | POA: Insufficient documentation

## 2013-07-22 DIAGNOSIS — Z794 Long term (current) use of insulin: Secondary | ICD-10-CM | POA: Insufficient documentation

## 2013-07-22 DIAGNOSIS — Z7902 Long term (current) use of antithrombotics/antiplatelets: Secondary | ICD-10-CM | POA: Insufficient documentation

## 2013-07-22 DIAGNOSIS — R0789 Other chest pain: Secondary | ICD-10-CM

## 2013-07-22 DIAGNOSIS — R0602 Shortness of breath: Secondary | ICD-10-CM | POA: Insufficient documentation

## 2013-07-22 LAB — CBC
HEMATOCRIT: 41.3 % (ref 39.0–52.0)
Hemoglobin: 14.8 g/dL (ref 13.0–17.0)
MCH: 30.3 pg (ref 26.0–34.0)
MCHC: 35.8 g/dL (ref 30.0–36.0)
MCV: 84.6 fL (ref 78.0–100.0)
Platelets: 211 10*3/uL (ref 150–400)
RBC: 4.88 MIL/uL (ref 4.22–5.81)
RDW: 12.6 % (ref 11.5–15.5)
WBC: 10.3 10*3/uL (ref 4.0–10.5)

## 2013-07-22 LAB — I-STAT TROPONIN, ED
Troponin i, poc: 0 ng/mL (ref 0.00–0.08)
Troponin i, poc: 0 ng/mL (ref 0.00–0.08)

## 2013-07-22 LAB — BASIC METABOLIC PANEL
BUN: 15 mg/dL (ref 6–23)
CO2: 23 mEq/L (ref 19–32)
CREATININE: 0.72 mg/dL (ref 0.50–1.35)
Calcium: 9.6 mg/dL (ref 8.4–10.5)
Chloride: 98 mEq/L (ref 96–112)
GFR calc non Af Amer: >90 mL/min (ref >90)
Glucose, Bld: 224 mg/dL — ABNORMAL HIGH (ref 70–99)
POTASSIUM: 4.3 meq/L (ref 3.7–5.3)
Sodium: 138 mEq/L (ref 137–147)

## 2013-07-22 LAB — PRO B NATRIURETIC PEPTIDE

## 2013-07-22 LAB — CBG MONITORING, ED: Glucose-Capillary: 176 mg/dL — ABNORMAL HIGH (ref 70–99)

## 2013-07-22 NOTE — Discharge Instructions (Signed)
Keep your appointment next week for your stress test and follow the results up with your cardiologist.  Continue your medications as before.  Return to the emergency department if your symptoms substantially worsen or change.   Chest Pain (Nonspecific) It is often hard to give a specific diagnosis for the cause of chest pain. There is always a chance that your pain could be related to something serious, such as a heart attack or a blood clot in the lungs. You need to follow up with your caregiver for further evaluation. CAUSES   Heartburn.  Pneumonia or bronchitis.  Anxiety or stress.  Inflammation around your heart (pericarditis) or lung (pleuritis or pleurisy).  A blood clot in the lung.  A collapsed lung (pneumothorax). It can develop suddenly on its own (spontaneous pneumothorax) or from injury (trauma) to the chest.  Shingles infection (herpes zoster virus). The chest wall is composed of bones, muscles, and cartilage. Any of these can be the source of the pain.  The bones can be bruised by injury.  The muscles or cartilage can be strained by coughing or overwork.  The cartilage can be affected by inflammation and become sore (costochondritis). DIAGNOSIS  Lab tests or other studies, such as X-rays, electrocardiography, stress testing, or cardiac imaging, may be needed to find the cause of your pain.  TREATMENT   Treatment depends on what may be causing your chest pain. Treatment may include:  Acid blockers for heartburn.  Anti-inflammatory medicine.  Pain medicine for inflammatory conditions.  Antibiotics if an infection is present.  You may be advised to change lifestyle habits. This includes stopping smoking and avoiding alcohol, caffeine, and chocolate.  You may be advised to keep your head raised (elevated) when sleeping. This reduces the chance of acid going backward from your stomach into your esophagus.  Most of the time, nonspecific chest pain will improve  within 2 to 3 days with rest and mild pain medicine. HOME CARE INSTRUCTIONS   If antibiotics were prescribed, take your antibiotics as directed. Finish them even if you start to feel better.  For the next few days, avoid physical activities that bring on chest pain. Continue physical activities as directed.  Do not smoke.  Avoid drinking alcohol.  Only take over-the-counter or prescription medicine for pain, discomfort, or fever as directed by your caregiver.  Follow your caregiver's suggestions for further testing if your chest pain does not go away.  Keep any follow-up appointments you made. If you do not go to an appointment, you could develop lasting (chronic) problems with pain. If there is any problem keeping an appointment, you must call to reschedule. SEEK MEDICAL CARE IF:   You think you are having problems from the medicine you are taking. Read your medicine instructions carefully.  Your chest pain does not go away, even after treatment.  You develop a rash with blisters on your chest. SEEK IMMEDIATE MEDICAL CARE IF:   You have increased chest pain or pain that spreads to your arm, neck, jaw, back, or abdomen.  You develop shortness of breath, an increasing cough, or you are coughing up blood.  You have severe back or abdominal pain, feel nauseous, or vomit.  You develop severe weakness, fainting, or chills.  You have a fever. THIS IS AN EMERGENCY. Do not wait to see if the pain will go away. Get medical help at once. Call your local emergency services (911 in [place] Do not drive yourself to the hospital. MAKE SURE YOU:  Understand these instructions.  Will watch your condition.  Will get help right away if you are not doing well or get worse. Document Released: 12/18/2004 Document Revised: 06/02/2011 Document Reviewed: 10/14/2007 [facility] Patient Information 2014 [place].

## 2013-07-22 NOTE — ED Notes (Signed)
Onset today middle chest pain pressure with left arm tingling pain 1-2/10 pressure. Took own one nitro and aspirin 325mg .  Stated pain resolved 0/10. EMS reported CBG 129 and ekg changes today from EKG from Doctor's office one day ago.

## 2013-07-22 NOTE — ED Provider Notes (Signed)
CSN: [id number]     Arrival date & time 07/22/13  1647 History   First MD Initiated Contact with Patient 07/22/13 1655     Chief Complaint  Patient presents with  . Chest Pain     (Consider location/radiation/quality/duration/timing/severity/associated sxs/prior Treatment) HPI Comments: Patient is a 48 year old male with history of diabetes and coronary artery disease. Is status post stenting 5 years ago, then required restenting 14 months ago. He presents today complaining of episodes of difficulty breathing that have been occurring intermittently for the past several days. States he has been going hiking and this seems to exacerbate his symptoms. He reports feeling tight in his chest, however he does not describe this as pain. This feels different than what he experienced with his by her cardiac issues. He was seen by Dr. Antoine PocheHochrein yesterday and was set up for a stress test next week.  Patient is a 48 y.o. male presenting with shortness of breath. The history is provided by the patient.  Shortness of Breath Severity:  Moderate Onset quality:  Sudden Duration:  3 days Timing:  Intermittent Chronicity:  New Context: activity   Relieved by:  Nothing Worsened by:  Nothing tried Ineffective treatments:  None tried   Past Medical History  Diagnosis Date  . CAD (coronary artery disease)     a. COSTAR study DES Stent to RCA 2006;  b. Myoview 10/08:  No ischemia, EF 72%.  c.  ETT 6/12:  Ex 9:00, no ischemic changes;  d. LHC  05/19/12: oLM 20%, LAD 40-50%, pD1 50%, mCFX 30-40%, mRCA stent with severe ISR and 70% beyond the stent, dRCA 50%, pPDA 50%, pPLA 30-40%.  EF 55-65%. =>  PCI:  Overlapping Promus DES x2 to the mid RCA ISR.  Marland Kitchen. HLD (hyperlipidemia)     with high triglycerides;  managed by PCP  . DM2 (diabetes mellitus, type 2)   . Obesity    Past Surgical History  Procedure Laterality Date  . Coronary angioplasty with stent placement  05/19/2012    DES   to RCA   by Dr Excell Seltzerooper   Family  History  Problem Relation Age of Onset  . Diabetes Mother   . Coronary artery disease Father 2959   History  Substance Use Topics  . Smoking status: Never Smoker   . Smokeless tobacco: Never Used  . Alcohol Use: Yes     Comment: rare    Review of Systems  Respiratory: Positive for shortness of breath.   All other systems reviewed and are negative.     Allergies  Ampicillin and Penicillins  Home Medications   Prior to Admission medications   Medication Sig Start Date End Date Taking? Authorizing Provider  atorvastatin (LIPITOR) 20 MG tablet Take 20 mg by mouth daily.    Historical Provider, MD  clopidogrel (PLAVIX) 75 MG tablet Take 1 tablet (75 mg total) by mouth daily. 05/20/12   Dayna N Dunn, PA-C  glimepiride (AMARYL) 4 MG tablet Take 4 mg by mouth 2 (two) times daily before a meal.    Historical Provider, MD  insulin glargine (LANTUS SOLOSTAR) 100 UNIT/ML injection Inject 45 Units into the skin at bedtime.    Historical Provider, MD  metFORMIN (GLUCOPHAGE) 1000 MG tablet Take 1 tablet (1,000 mg total) by mouth 2 (two) times daily with a meal. Give with 250 mg to = 1250 mg dose 05/21/12   Dayna N Dunn, PA-C  metFORMIN (GLUCOPHAGE) 500 MG tablet Take 0.5 tablets (250 mg total) by mouth  2 (two) times daily with a meal. Give with 1000 mg to = 1250 mg dose 05/21/12   Dayna N Dunn, PA-C  Multiple Vitamin (MULTIVITAMIN) tablet Take 1 tablet by mouth daily.     Historical Provider, MD  nitroGLYCERIN (NITROSTAT) 0.4 MG SL tablet Place 1 tablet (0.4 mg total) under the tongue every 5 (five) minutes as needed for chest pain. 05/11/12   Scott T Weaver, PA-C   BP 132/83  Pulse 84  Temp(Src) 98.6 F (37 C) (Oral)  Resp 14  SpO2 100% Physical Exam  Nursing note and vitals reviewed. Constitutional: He is oriented to person, place, and time. He appears well-developed and well-nourished. No distress.  HENT:  Head: Normocephalic and atraumatic.  Mouth/Throat: Oropharynx is clear and moist.   Neck: Normal range of motion. Neck supple.  Cardiovascular: Normal rate, regular rhythm and normal heart sounds.   No murmur heard. Pulmonary/Chest: Effort normal and breath sounds normal. No respiratory distress. He has no wheezes.  Abdominal: Soft. Bowel sounds are normal. He exhibits no distension and no mass. There is no tenderness.  Musculoskeletal: Normal range of motion. He exhibits no edema.  Neurological: He is alert and oriented to person, place, and time.  Skin: Skin is warm and dry. He is not diaphoretic.    ED Course  Procedures (including critical care time) Labs Review Labs Reviewed  CBC  BASIC METABOLIC PANEL  I-STAT TROPOININ, ED    Imaging Review No results found.   EKG Interpretation   Date/Time:  Friday Jul 22 2013 16:55:05 EDT Ventricular Rate:  78 PR Interval:  155 QRS Duration: 90 QT Interval:  382 QTC Calculation: 435 R Axis:   36 Text Interpretation:  Sinus rhythm Nonspecific T abnormalities, inferior  leads T wave abnormality new when compared with prior ecg Confirmed by  DELOS  MD, Vedanth Sirico ([id number]) on 07/22/2013 5:00:12 PM      MDM   Final diagnoses:  None    Patient presents here with complaints of shortness of breath that has been occurring intermittently for the past few days. He was seen by Dr. Antoine PocheHochrein and has a stress test set up for next week. He had a worse episode this evening and came to be evaluated. Initial troponin and three-hour troponin were both negative and EKG is unchanged. He appears clinically well and believes he may have taken too much of his insulin. I've discussed with Dr. Mayford Knifeurner from cardiology who feels as though he is appropriate for discharge with followup next week for a stress test and return if he worsens.    Geoffery Lyonsouglas Shakai Dolley, MD 07/22/13 [phone]

## 2013-07-22 NOTE — ED Notes (Addendum)
Patient stated took self off his medication 2 weeks ago and restarted medications.  Looked on Internet and said the side effect of novolog are some of the same symptoms he has.  States he took 8 units of novolog today. Doctor notified.

## 2013-07-22 NOTE — ED Notes (Signed)
Stated start taking OTC supplements 5 days ago stopped the next day because of new symptoms.  Have been exercising by hiking recently 2-3 miles a day.

## 2013-07-22 NOTE — ED Notes (Signed)
EDP at bedside  

## 2013-07-22 NOTE — ED Notes (Signed)
[facility]

## 2013-07-22 NOTE — ED Notes (Signed)
Stated seen Cardiology one day ago and scheduled a stress test May 6th.

## 2013-07-27 ENCOUNTER — Encounter (HOSPITAL_COMMUNITY): Payer: BC Managed Care – PPO

## 2013-08-02 ENCOUNTER — Other Ambulatory Visit: Payer: Self-pay | Admitting: Physician Assistant

## 2013-08-04 ENCOUNTER — Telehealth (HOSPITAL_COMMUNITY): Payer: Self-pay

## 2013-08-10 ENCOUNTER — Encounter (HOSPITAL_COMMUNITY): Payer: BC Managed Care – PPO

## 2013-09-22 NOTE — Telephone Encounter (Signed)
Encounter complete. 

## 2013-09-29 NOTE — Telephone Encounter (Signed)
Encounter complete. 

## 2014-03-02 ENCOUNTER — Encounter (HOSPITAL_COMMUNITY): Payer: Self-pay | Admitting: Cardiovascular Disease

## 2014-05-29 DIAGNOSIS — E113293 Type 2 diabetes mellitus with mild nonproliferative diabetic retinopathy without macular edema, bilateral: Secondary | ICD-10-CM | POA: Insufficient documentation

## 2015-01-05 ENCOUNTER — Inpatient Hospital Stay (HOSPITAL_COMMUNITY)
Admission: EM | Admit: 2015-01-05 | Discharge: 2015-01-06 | DRG: 247 | Disposition: A | Discharge status: Home / Self Care | Payer: BLUE CROSS/BLUE SHIELD | Attending: Cardiovascular Disease | Admitting: Cardiovascular Disease

## 2015-01-05 ENCOUNTER — Emergency Department (HOSPITAL_COMMUNITY): Payer: BLUE CROSS/BLUE SHIELD

## 2015-01-05 ENCOUNTER — Encounter (HOSPITAL_COMMUNITY)
Admission: EM | Disposition: A | Discharge status: Home / Self Care | Payer: BLUE CROSS/BLUE SHIELD | Source: Home / Self Care | Attending: Cardiovascular Disease

## 2015-01-05 ENCOUNTER — Encounter (HOSPITAL_COMMUNITY): Payer: Self-pay | Admitting: Emergency Medicine

## 2015-01-05 DIAGNOSIS — E1165 Type 2 diabetes mellitus with hyperglycemia: Secondary | ICD-10-CM | POA: Diagnosis present

## 2015-01-05 DIAGNOSIS — Z9114 Patient's other noncompliance with medication regimen: Secondary | ICD-10-CM

## 2015-01-05 DIAGNOSIS — R079 Chest pain, unspecified: Secondary | ICD-10-CM

## 2015-01-05 DIAGNOSIS — E785 Hyperlipidemia, unspecified: Secondary | ICD-10-CM | POA: Diagnosis present

## 2015-01-05 DIAGNOSIS — T45526A Underdosing of antithrombotic drugs, initial encounter: Secondary | ICD-10-CM | POA: Diagnosis present

## 2015-01-05 DIAGNOSIS — I200 Unstable angina: Secondary | ICD-10-CM | POA: Diagnosis present

## 2015-01-05 DIAGNOSIS — T466X6A Underdosing of antihyperlipidemic and antiarteriosclerotic drugs, initial encounter: Secondary | ICD-10-CM | POA: Diagnosis present

## 2015-01-05 DIAGNOSIS — T383X6A Underdosing of insulin and oral hypoglycemic [antidiabetic] drugs, initial encounter: Secondary | ICD-10-CM | POA: Diagnosis present

## 2015-01-05 DIAGNOSIS — Z7982 Long term (current) use of aspirin: Secondary | ICD-10-CM | POA: Diagnosis present

## 2015-01-05 DIAGNOSIS — Z955 Presence of coronary angioplasty implant and graft: Secondary | ICD-10-CM | POA: Diagnosis not present

## 2015-01-05 DIAGNOSIS — E669 Obesity, unspecified: Secondary | ICD-10-CM | POA: Diagnosis present

## 2015-01-05 DIAGNOSIS — Z794 Long term (current) use of insulin: Secondary | ICD-10-CM

## 2015-01-05 DIAGNOSIS — Z6828 Body mass index (BMI) 28.0-28.9, adult: Secondary | ICD-10-CM

## 2015-01-05 DIAGNOSIS — Z9861 Coronary angioplasty status: Secondary | ICD-10-CM

## 2015-01-05 DIAGNOSIS — I25110 Atherosclerotic heart disease of native coronary artery with unstable angina pectoris: Principal | ICD-10-CM | POA: Diagnosis present

## 2015-01-05 DIAGNOSIS — E118 Type 2 diabetes mellitus with unspecified complications: Secondary | ICD-10-CM

## 2015-01-05 DIAGNOSIS — I2510 Atherosclerotic heart disease of native coronary artery without angina pectoris: Secondary | ICD-10-CM | POA: Diagnosis present

## 2015-01-05 DIAGNOSIS — I25119 Atherosclerotic heart disease of native coronary artery with unspecified angina pectoris: Secondary | ICD-10-CM

## 2015-01-05 HISTORY — PX: CARDIAC CATHETERIZATION: SHX172

## 2015-01-05 HISTORY — PX: CORONARY STENT PLACEMENT: SHX1402

## 2015-01-05 HISTORY — DX: Gastro-esophageal reflux disease without esophagitis: K21.9

## 2015-01-05 LAB — PROTIME-INR
INR: 1.02 (ref 0.00–1.49)
INR: 1.04 (ref 0.00–1.49)
PROTHROMBIN TIME: 13.6 s (ref 11.6–15.2)
PROTHROMBIN TIME: 13.8 s (ref 11.6–15.2)

## 2015-01-05 LAB — LIPID PANEL
Cholesterol: 208 mg/dL — ABNORMAL HIGH (ref 0–200)
HDL: 29 mg/dL — AB (ref >40)
LDL CALC: 105 mg/dL — AB (ref 0–99)
TRIGLYCERIDES: 372 mg/dL — AB (ref <150)
Total CHOL/HDL Ratio: 7.2 RATIO
VLDL: 74 mg/dL — AB (ref 0–40)

## 2015-01-05 LAB — T4, FREE: Free T4: 0.73 ng/dL (ref 0.61–1.12)

## 2015-01-05 LAB — GLUCOSE, CAPILLARY
GLUCOSE-CAPILLARY: 150 mg/dL — AB (ref 65–99)
Glucose-Capillary: 99 mg/dL (ref 65–99)

## 2015-01-05 LAB — I-STAT TROPONIN, ED: TROPONIN I, POC: 0.01 ng/mL (ref 0.00–0.08)

## 2015-01-05 LAB — TROPONIN I

## 2015-01-05 LAB — BASIC METABOLIC PANEL
Anion gap: 6 (ref 5–15)
BUN: 9 mg/dL (ref 6–20)
CALCIUM: 9.4 mg/dL (ref 8.9–10.3)
CO2: 28 mmol/L (ref 22–32)
CREATININE: 0.91 mg/dL (ref 0.61–1.24)
Chloride: 104 mmol/L (ref 101–111)
GFR calc Af Amer: >60 mL/min (ref >60)
GFR calc non Af Amer: >60 mL/min (ref >60)
GLUCOSE: 204 mg/dL — AB (ref 65–99)
Potassium: 4.4 mmol/L (ref 3.5–5.1)
Sodium: 138 mmol/L (ref 135–145)

## 2015-01-05 LAB — POCT ACTIVATED CLOTTING TIME: ACTIVATED CLOTTING TIME: 810 s

## 2015-01-05 LAB — CBC
HCT: 41.6 % (ref 39.0–52.0)
Hemoglobin: 14.5 g/dL (ref 13.0–17.0)
MCH: 30.1 pg (ref 26.0–34.0)
MCHC: 34.9 g/dL (ref 30.0–36.0)
MCV: 86.5 fL (ref 78.0–100.0)
PLATELETS: 206 10*3/uL (ref 150–400)
RBC: 4.81 MIL/uL (ref 4.22–5.81)
RDW: 12.7 % (ref 11.5–15.5)
WBC: 6.8 10*3/uL (ref 4.0–10.5)

## 2015-01-05 LAB — TSH: TSH: 1.298 u[IU]/mL (ref 0.350–4.500)

## 2015-01-05 LAB — MAGNESIUM: MAGNESIUM: 2 mg/dL (ref 1.7–2.4)

## 2015-01-05 SURGERY — LEFT HEART CATH AND CORONARY ANGIOGRAPHY

## 2015-01-05 MED ORDER — SODIUM CHLORIDE 0.9 % WEIGHT BASED INFUSION: 1.0000 mL/kg/h | INTRAVENOUS | Status: DC

## 2015-01-05 MED ORDER — SODIUM CHLORIDE 0.9 % IJ SOLN
3.0000 mL | Freq: Two times a day (BID) | INTRAMUSCULAR | Status: DC
  Administered 2015-01-05: 3 mL via INTRAVENOUS

## 2015-01-05 MED ORDER — SODIUM CHLORIDE 0.9 % IV SOLN: 250.0000 mL | INTRAVENOUS | Status: DC | PRN

## 2015-01-05 MED ORDER — ANGIOPLASTY BOOK
Freq: Once | Status: AC
  Administered 2015-01-05: 18:00:00
  Filled 2015-01-05: qty 1

## 2015-01-05 MED ORDER — ONDANSETRON HCL 4 MG/2ML IJ SOLN: 4.0000 mg | Freq: Four times a day (QID) | INTRAMUSCULAR | Status: DC | PRN

## 2015-01-05 MED ORDER — ACETAMINOPHEN 325 MG PO TABS
650.0000 mg | ORAL_TABLET | ORAL | Status: DC | PRN
  Administered 2015-01-05: 650 mg via ORAL
  Filled 2015-01-05 (×2): qty 2

## 2015-01-05 MED ORDER — HEPARIN (PORCINE) IN NACL 2-0.9 UNIT/ML-% IJ SOLN
INTRAMUSCULAR | Status: AC
  Filled 2015-01-05: qty 500

## 2015-01-05 MED ORDER — SODIUM CHLORIDE 0.9 % WEIGHT BASED INFUSION: 3.0000 mL/kg/h | INTRAVENOUS | Status: DC

## 2015-01-05 MED ORDER — SODIUM CHLORIDE 0.9 % IV SOLN
INTRAVENOUS | Status: DC | PRN
  Administered 2015-01-05: 250 mL/h via INTRAVENOUS

## 2015-01-05 MED ORDER — HEPARIN (PORCINE) IN NACL 2-0.9 UNIT/ML-% IJ SOLN
INTRAMUSCULAR | Status: AC
  Filled 2015-01-05: qty 1000

## 2015-01-05 MED ORDER — NITROGLYCERIN 2 % TD OINT
1.0000 [in_us] | TOPICAL_OINTMENT | Freq: Four times a day (QID) | TRANSDERMAL | Status: DC
  Administered 2015-01-05: 1 [in_us] via TOPICAL
  Filled 2015-01-05: qty 1

## 2015-01-05 MED ORDER — ALPRAZOLAM 0.25 MG PO TABS: 0.2500 mg | ORAL_TABLET | Freq: Two times a day (BID) | ORAL | Status: DC | PRN

## 2015-01-05 MED ORDER — ASPIRIN 81 MG PO CHEW: 81.0000 mg | CHEWABLE_TABLET | ORAL | Status: DC

## 2015-01-05 MED ORDER — CLOPIDOGREL BISULFATE 75 MG PO TABS
75.0000 mg | ORAL_TABLET | Freq: Every day | ORAL | Status: DC
  Administered 2015-01-05 – 2015-01-06 (×2): 75 mg via ORAL
  Filled 2015-01-05 (×2): qty 1

## 2015-01-05 MED ORDER — CLOPIDOGREL BISULFATE 75 MG PO TABS
ORAL_TABLET | ORAL | Status: DC | PRN
  Administered 2015-01-05: 600 mg via ORAL

## 2015-01-05 MED ORDER — FENTANYL CITRATE (PF) 100 MCG/2ML IJ SOLN
INTRAMUSCULAR | Status: AC
  Filled 2015-01-05: qty 4

## 2015-01-05 MED ORDER — CLOPIDOGREL BISULFATE 300 MG PO TABS
ORAL_TABLET | ORAL | Status: AC
  Filled 2015-01-05: qty 1

## 2015-01-05 MED ORDER — MIDAZOLAM HCL 2 MG/2ML IJ SOLN
INTRAMUSCULAR | Status: DC | PRN
  Administered 2015-01-05: 1 mg via INTRAVENOUS

## 2015-01-05 MED ORDER — SIMVASTATIN 20 MG PO TABS
20.0000 mg | ORAL_TABLET | Freq: Every day | ORAL | Status: DC
  Administered 2015-01-05: 20 mg via ORAL
  Filled 2015-01-05: qty 1

## 2015-01-05 MED ORDER — SODIUM CHLORIDE 0.9 % IJ SOLN: 3.0000 mL | INTRAMUSCULAR | Status: DC | PRN

## 2015-01-05 MED ORDER — INSULIN GLARGINE 100 UNIT/ML ~~LOC~~ SOLN
45.0000 [IU] | Freq: Every day | SUBCUTANEOUS | Status: DC
  Filled 2015-01-05 (×2): qty 0.45

## 2015-01-05 MED ORDER — HEPARIN SODIUM (PORCINE) 1000 UNIT/ML IJ SOLN
INTRAMUSCULAR | Status: AC
  Filled 2015-01-05: qty 1

## 2015-01-05 MED ORDER — SODIUM CHLORIDE 0.9 % IJ SOLN: 3.0000 mL | Freq: Two times a day (BID) | INTRAMUSCULAR | Status: DC

## 2015-01-05 MED ORDER — HEPARIN SODIUM (PORCINE) 1000 UNIT/ML IJ SOLN
INTRAMUSCULAR | Status: DC | PRN
  Administered 2015-01-05: 4000 [IU] via INTRAVENOUS

## 2015-01-05 MED ORDER — SODIUM CHLORIDE 0.9 % IV SOLN
250.0000 mg | INTRAVENOUS | Status: DC | PRN
  Administered 2015-01-05: 1.75 mg/kg/h via INTRAVENOUS

## 2015-01-05 MED ORDER — ASPIRIN EC 81 MG PO TBEC
81.0000 mg | DELAYED_RELEASE_TABLET | Freq: Every day | ORAL | Status: DC
  Administered 2015-01-06: 81 mg via ORAL
  Filled 2015-01-05: qty 1

## 2015-01-05 MED ORDER — HEPARIN (PORCINE) IN NACL 100-0.45 UNIT/ML-% IJ SOLN
1050.0000 [IU]/h | INTRAMUSCULAR | Status: DC
  Administered 2015-01-05: 1050 [IU]/h via INTRAVENOUS
  Filled 2015-01-05 (×2): qty 250

## 2015-01-05 MED ORDER — SODIUM CHLORIDE 0.9 % WEIGHT BASED INFUSION
3.0000 mL/kg/h | INTRAVENOUS | Status: AC
  Administered 2015-01-05: 18:00:00 3 mL/kg/h via INTRAVENOUS

## 2015-01-05 MED ORDER — HEPARIN BOLUS VIA INFUSION
4000.0000 [IU] | Freq: Once | INTRAVENOUS | Status: AC
  Administered 2015-01-05: 4000 [IU] via INTRAVENOUS
  Filled 2015-01-05: qty 4000

## 2015-01-05 MED ORDER — SODIUM CHLORIDE 0.9 % IV SOLN: INTRAVENOUS | Status: DC

## 2015-01-05 MED ORDER — NITROGLYCERIN 0.4 MG SL SUBL: 0.4000 mg | SUBLINGUAL_TABLET | SUBLINGUAL | Status: DC | PRN

## 2015-01-05 MED ORDER — LIDOCAINE HCL (PF) 1 % IJ SOLN
INTRAMUSCULAR | Status: AC
  Filled 2015-01-05: qty 30

## 2015-01-05 MED ORDER — ZOLPIDEM TARTRATE 5 MG PO TABS: 5.0000 mg | ORAL_TABLET | Freq: Every evening | ORAL | Status: DC | PRN

## 2015-01-05 MED ORDER — FENTANYL CITRATE (PF) 100 MCG/2ML IJ SOLN
INTRAMUSCULAR | Status: DC | PRN
  Administered 2015-01-05: 25 ug via INTRAVENOUS

## 2015-01-05 MED ORDER — BIVALIRUDIN 250 MG IV SOLR
INTRAVENOUS | Status: AC
  Filled 2015-01-05: qty 250

## 2015-01-05 MED ORDER — METOPROLOL TARTRATE 12.5 MG HALF TABLET
12.5000 mg | ORAL_TABLET | Freq: Two times a day (BID) | ORAL | Status: DC
  Administered 2015-01-06: 10:00:00 12.5 mg via ORAL
  Filled 2015-01-05: qty 1

## 2015-01-05 MED ORDER — MIDAZOLAM HCL 2 MG/2ML IJ SOLN
INTRAMUSCULAR | Status: AC
  Filled 2015-01-05: qty 4

## 2015-01-05 MED ORDER — VERAPAMIL HCL 2.5 MG/ML IV SOLN
INTRAVENOUS | Status: DC | PRN
  Administered 2015-01-05: 10 mL via INTRA_ARTERIAL

## 2015-01-05 MED ORDER — VERAPAMIL HCL 2.5 MG/ML IV SOLN
INTRAVENOUS | Status: AC
  Filled 2015-01-05: qty 2

## 2015-01-05 MED ORDER — INSULIN ASPART 100 UNIT/ML ~~LOC~~ SOLN
5.0000 [IU] | Freq: Three times a day (TID) | SUBCUTANEOUS | Status: DC
  Administered 2015-01-05 – 2015-01-06 (×2): 5 [IU] via SUBCUTANEOUS

## 2015-01-05 MED ORDER — BIVALIRUDIN BOLUS VIA INFUSION - CUPID
INTRAVENOUS | Status: DC | PRN
Start: 2015-01-05 — End: 2015-01-05
  Administered 2015-01-05: 61.725 mg via INTRAVENOUS

## 2015-01-05 SURGICAL SUPPLY — 19 items
BALLN EMERGE MR 2.5X12 (BALLOONS) ×3
BALLN ~~LOC~~ EMERGE MR 3.25X8 (BALLOONS) ×3
BALLOON EMERGE MR 2.5X12 (BALLOONS) ×2 IMPLANT
BALLOON ~~LOC~~ EMERGE MR 3.25X8 (BALLOONS) ×2 IMPLANT
CATH INFINITI 5 FR JL3.5 (CATHETERS) ×3 IMPLANT
CATH INFINITI 5FR ANG PIGTAIL (CATHETERS) ×3 IMPLANT
CATH INFINITI JR4 5F (CATHETERS) ×3 IMPLANT
DEVICE RAD COMP TR BAND LRG (VASCULAR PRODUCTS) ×3 IMPLANT
GLIDESHEATH SLEND SS 6F .021 (SHEATH) ×3 IMPLANT
GUIDE CATH RUNWAY 6FR CLS3.5 (CATHETERS) ×3 IMPLANT
KIT ENCORE 26 ADVANTAGE (KITS) ×3 IMPLANT
KIT HEART LEFT (KITS) ×3 IMPLANT
PACK CARDIAC CATHETERIZATION (CUSTOM PROCEDURE TRAY) ×3 IMPLANT
STENT PROMUS PREM MR 3.0X12 (Permanent Stent) ×3 IMPLANT
SYR MEDRAD MARK V 150ML (SYRINGE) ×3 IMPLANT
TRANSDUCER W/STOPCOCK (MISCELLANEOUS) ×3 IMPLANT
TUBING CIL FLEX 10 FLL-RA (TUBING) ×3 IMPLANT
WIRE ASAHI PROWATER 180CM (WIRE) ×3 IMPLANT
WIRE SAFE-T 1.5MM-J .035X260CM (WIRE) ×3 IMPLANT

## 2015-01-05 NOTE — ED Provider Notes (Signed)
CSN: [id number]     Arrival date & time 01/05/15  [id number] History   First MD Initiated Contact with Patient 01/05/15 0825     Chief Complaint  Patient presents with  . Chest Pain     (Consider location/radiation/quality/duration/timing/severity/associated sxs/prior Treatment) Patient is a 49 y.o. male presenting with chest pain. The history is provided by the patient.  Chest Pain Pain location:  Substernal area Pain quality: pressure   Pain severity:  Moderate Onset quality:  Sudden Duration:  2 hours Timing:  Intermittent Progression:  Resolved Chronicity:  New Context: at rest   Relieved by:  Nothing Worsened by:  Nothing tried Ineffective treatments:  None tried Associated symptoms: shortness of breath (with stairs for a week)   Associated symptoms: no abdominal pain   Risk factors: coronary artery disease, diabetes mellitus and high cholesterol     Past Medical History  Diagnosis Date  . CAD (coronary artery disease)     a. COSTAR study DES Stent to RCA 2006;  b. Myoview 10/08:  No ischemia, EF 72%.  c.  ETT 6/12:  Ex 9:00, no ischemic changes;  d. LHC  05/19/12: oLM 20%, LAD 40-50%, pD1 50%, mCFX 30-40%, mRCA stent with severe ISR and 70% beyond the stent, dRCA 50%, pPDA 50%, pPLA 30-40%.  EF 55-65%. =>  PCI:  Overlapping Promus DES x2 to the mid RCA ISR.  Marland Kitchen. HLD (hyperlipidemia)     with high triglycerides;  managed by PCP  . DM2 (diabetes mellitus, type 2) (HCC)   . Obesity    Past Surgical History  Procedure Laterality Date  . Coronary angioplasty with stent placement  05/19/2012    DES   to RCA   by Dr Excell Seltzerooper  . Percutaneous coronary stent intervention (pci-s) N/A 05/19/2012    Procedure: PERCUTANEOUS CORONARY STENT INTERVENTION (PCI-S);  Surgeon: Tonny BollmanMichael Cooper, MD;  [facility];  Service: Cardiovascular;  Laterality: N/A;   Family History  Problem Relation Age of Onset  . Diabetes Mother   . Coronary artery disease Father [id number]   Social History   Substance Use Topics  . Smoking status: Never Smoker   . Smokeless tobacco: Never Used  . Alcohol Use: Yes     Comment: rare    Review of Systems  Respiratory: Positive for shortness of breath (with stairs for a week).   Cardiovascular: Positive for chest pain.  Gastrointestinal: Negative for abdominal pain.  All other systems reviewed and are negative.     Allergies  Ampicillin and Penicillins  Home Medications   Prior to Admission medications   Medication Sig Start Date End Date Taking? Authorizing Provider  aspirin 325 MG tablet Take 325 mg by mouth daily.    Historical Provider, MD  atorvastatin (LIPITOR) 20 MG tablet Take 20 mg by mouth daily.    Historical Provider, MD  clopidogrel (PLAVIX) 75 MG tablet Take 1 tablet (75 mg total) by mouth daily. 05/20/12   Dayna N Dunn, PA-C  glimepiride (AMARYL) 4 MG tablet Take 4 mg by mouth 2 (two) times daily before a meal.    Historical Provider, MD  insulin aspart (NOVOLOG) 100 UNIT/ML injection Inject 1-10 Units into the skin 3 (three) times daily before meals.    Historical Provider, MD  insulin glargine (LANTUS SOLOSTAR) 100 UNIT/ML injection Inject 45 Units into the skin at bedtime.    Historical Provider, MD  metFORMIN (GLUCOPHAGE) 1000 MG tablet Take 1 tablet (1,000 mg total) by mouth 2 (two) times daily  with a meal. Give with 250 mg to = 1250 mg dose 05/21/12   Dayna N Dunn, PA-C  metFORMIN (GLUCOPHAGE) 500 MG tablet Take 0.5 tablets (250 mg total) by mouth 2 (two) times daily with a meal. Give with 1000 mg to = 1250 mg dose 05/21/12   Dayna N Dunn, PA-C  NITROSTAT 0.4 MG SL tablet PLACE 1 TABLET (0.4 MG TOTAL) UNDER THE TONGUE EVERY 5 (FIVE) MINUTES AS NEEDED FOR CHEST PAIN.    [name], MD  OVER THE COUNTER MEDICATION Take 400 mg by mouth 2 (two) times daily. Jymnema Sylvestre - blood glucose control    Historical Provider, MD   BP 101/66 mmHg  Pulse 71  Temp(Src) 98.3 F (36.8 C) (Oral)  Resp 18  Ht  (1.676 m)   Wt 178 lb (80.74 kg)  BMI 28.74 kg/m2  SpO2 98% Physical Exam  Constitutional: He is oriented to person, place, and time. He appears well-developed and well-nourished. No distress.  HENT:  Head: Normocephalic and atraumatic.  Eyes: Conjunctivae are normal.  Neck: Neck supple. No tracheal deviation present.  Cardiovascular: Normal rate, regular rhythm and normal heart sounds.   Pulmonary/Chest: Effort normal and breath sounds normal. No respiratory distress. He has no wheezes. He has no rales.  Abdominal: Soft. He exhibits no distension. There is no tenderness.  Neurological: He is alert and oriented to person, place, and time.  Skin: Skin is warm and dry.  Psychiatric: He has a normal mood and affect.  Vitals reviewed.   ED Course  Procedures (including critical care time) Labs Review Labs Reviewed  BASIC METABOLIC PANEL - Abnormal; Notable for the following:    Glucose, Bld 204 (*)    All other components within normal limits  CBC  I-STAT TROPOININ, ED    Imaging Review Dg Chest Port 1 View  01/05/2015  CLINICAL DATA:  Chest tightness this morning. EXAM: PORTABLE CHEST 1 VIEW COMPARISON:  07/22/2013 FINDINGS: The heart size and mediastinal contours are within normal limits. Both lungs are clear. The visualized skeletal structures are unremarkable. IMPRESSION: No active disease. Electronically Signed   By: Charlett Nose M.D.   On: 01/05/2015 08:47   I have personally reviewed and evaluated these images and lab results as part of my medical decision-making.   EKG Interpretation   Date/Time:  Friday January 05 2015 08:24:56 EDT Ventricular Rate:  70 PR Interval:  139 QRS Duration: 88 QT Interval:  401 QTC Calculation: 433 R Axis:   -29 Text Interpretation:  Sinus rhythm Borderline left axis deviation Since  last tracing T wave inversion improved in Inferior leads Confirmed by  Christophere Hillhouse MD, Reuel Boom ([id number]) on 01/05/2015 8:31:59 AM      MDM   Final diagnoses:  Coronary  artery disease involving native coronary artery of native heart with angina pectoris (HCC)  Chest pain, unspecified chest pain type   49 year old male with history of coronary artery disease presents with sudden onset chest pressure that started over the center of his chest and felt like "a weight pressing" onto him. He had the pain for roughly an hour and a half ongoing, was seen by EMS and given 325 of aspirin and nitroglycerin which resolved his pain. No acute EKG findings currently and patient has stated noncompliance with medications except for insulin as he is trying more "natural therapies". I explained to the patient the difference between naturopathy and scientific research with regard to risk reduction and outcomes. Cardiology consulted for evaluation of patient  and they plan for admission with cardiac catheterization.    Lyndal Pulley, MD 01/05/15 [phone]

## 2015-01-05 NOTE — Progress Notes (Signed)
ANTICOAGULATION CONSULT NOTE - Initial Consult  Pharmacy Consult for Heparin  Indication: chest pain/ACS  Allergies  Allergen Reactions  . Ampicillin     Digestive reaction at age 49  . Penicillins     Digestive reaction    Patient Measurements: Height: 5\' 6"  (167.6 cm) Weight: 178 lb (80.74 kg) IBW/kg (Calculated) : 63.8  Vital Signs: Temp: 98.3 F (36.8 C) (10/14 0824) Temp Source: Oral (10/14 0824) BP: 119/67 mmHg (10/14 1115) Pulse Rate: 73 (10/14 1115)  Labs:  Recent Labs  01/05/15 0848  HGB 14.5  HCT 41.6  PLT 206  LABPROT 13.6  INR 1.02  CREATININE 0.91    Estimated Creatinine Clearance: 99.1 mL/min (by C-G formula based on Cr of 0.91).   Medications:   (Not in a hospital admission)  Assessment: [age] YOM with a history of CAD who presented with chest discomfort. Pharmacy consulted to start IV heparin for ACS. Troponin is wnl. H/H and Plt wnl.   Goal of Therapy:  Heparin level 0.3-0.7 units/ml Monitor platelets by anticoagulation protocol: Yes   Plan:  -Heparin 4000 units IV bolus followed by heparin 1050 units/hr -F/u 6 hr HL -Monitor daily HL, CBC and s/s of bleeding  -Coronary angiogram later today   Vinnie LevelBenjamin Alastor Kneale, PharmD., BCPS Clinical Pharmacist Pager [phone]

## 2015-01-05 NOTE — ED Notes (Signed)
Per EMS, patient started having chest tightness last night with resolve once he took a NTG SL this morning.   Patient states he has had SOB X 2 days with exertion.   Patient states he is no longer having tightness, but still has SOB.

## 2015-01-05 NOTE — H&P (Signed)
Danny [name] is an 49 y.o. male.    Primary Cardiologist:Dr. Hochrein Pcp Not In System  Chief Complaint: chest tightness and SOB HPI: 49 year old male with hx of CAD and stent to RCA 2006, last cath 2014 with oLM 20%, LAD 40-50%, pD1 50%, mCFX 30-40%, mRCA stent with severe ISR and 70% beyond the stent, dRCA 50%, pPDA 50%, pPLA 30-40%. EF 55-65%. => PCI: Overlapping Promus DES x2 to the mid RCA ISR placed. other hx of DM2, hyperlipidemia, now presents with chest tightness that began last night and with SL NTG today it resolved.    Pt takes ASA maybe twice a week, stopped plavix 2 months ago, no statin for 7-8 months due to leg cramps.  Is on insulin for his diabetes.  4-5 days ago had DOE and since not every time he climbs steps but at times. Then last pm developed chest tightness, mild, he went to sleep with this but woke without out any discomfort.  Shortly after waking it returned.  No associated symptoms and Dyspnea only with exertion.  He was concerned and called EMS.  This is somewhat different than previous episodes of angina.  Currently mild recurrent pressure after my exam.  He also stated he had been sawing some wood earlier in the week.     DHR:CBULA rhythm Borderline left axis deviation Since last tracing T wave inversion improved in Inferior leads Labs:  Troponin 0.01   Intolerance to crestor, possible pravachol, and now atorvastatin.   Past Medical History  Diagnosis Date  . CAD (coronary artery disease)     a. COSTAR study DES Stent to RCA 2006;  b. Myoview 10/08:  No ischemia, EF 72%.  c.  ETT 6/12:  Ex 9:00, no ischemic changes;  d. LHC  05/19/12: oLM 20%, LAD 40-50%, pD1 50%, mCFX 30-40%, mRCA stent with severe ISR and 70% beyond the stent, dRCA 50%, pPDA 50%, pPLA 30-40%.  EF 55-65%. =>  PCI:  Overlapping Promus DES x2 to the mid RCA ISR.  Marland Kitchen HLD (hyperlipidemia)     with high triglycerides;  managed by PCP  . DM2 (diabetes mellitus, type 2) ([place])     . Obesity     Past Surgical History  Procedure Laterality Date  . Coronary angioplasty with stent placement  05/19/2012    DES   to RCA   by Dr Burt Knack  . Percutaneous coronary stent intervention (pci-s) N/A 05/19/2012    Procedure: PERCUTANEOUS CORONARY STENT INTERVENTION (PCI-S);  Surgeon: Sherren Mocha, MD;  Location: [facility] CATH LAB;  Service: Cardiovascular;  Laterality: N/A;    Family History  Problem Relation Age of Onset  . Diabetes Mother   . Coronary artery disease Father 65   Social History:  reports that he has never smoked. He has never used smokeless tobacco. He reports that he drinks alcohol. He reports that he does not use illicit drugs.  Allergies:  Allergies  Allergen Reactions  . Ampicillin     Digestive reaction at age 30  . Penicillins     Digestive reaction    OUTPATIENT MEDICATIONS: ASA on occasion  No plavix in 2 months though took one yesterday Insulin novolog and lantus OTC glucose control med.   Results for orders placed or performed during the hospital encounter of 01/05/15 (from the past 48 hour(s))  Basic metabolic panel     Status: Abnormal   Collection Time: 01/05/15  8:48 AM  Result Value  Ref Range   Sodium 138 135 - 145 mmol/L   Potassium 4.4 3.5 - 5.1 mmol/L   Chloride 104 101 - 111 mmol/L   CO2 28 22 - 32 mmol/L   Glucose, Bld 204 (H) 65 - 99 mg/dL   BUN 9 6 - 20 mg/dL   Creatinine, Ser 0.91 0.61 - 1.24 mg/dL   Calcium 9.4 8.9 - 10.3 mg/dL   GFR calc non Af Amer >60 >60 mL/min   GFR calc Af Amer >60 >60 mL/min    Comment: (NOTE) The eGFR has been calculated using the CKD EPI equation. This calculation has not been validated in all clinical situations. eGFR's persistently <60 mL/min signify possible Chronic Kidney Disease.    Anion gap 6 5 - 15  CBC     Status: None   Collection Time: 01/05/15  8:48 AM  Result Value Ref Range   WBC 6.8 4.0 - 10.5 K/uL   RBC 4.81 4.22 - 5.81 MIL/uL   Hemoglobin 14.5 13.0 - 17.0 g/dL   HCT 41.6  39.0 - 52.0 %   MCV 86.5 78.0 - 100.0 fL   MCH 30.1 26.0 - 34.0 pg   MCHC 34.9 30.0 - 36.0 g/dL   RDW 12.7 11.5 - 15.5 %   Platelets 206 150 - 400 K/uL  I-stat troponin, ED     Status: None   Collection Time: 01/05/15  8:52 AM  Result Value Ref Range   Troponin i, poc 0.01 0.00 - 0.08 ng/mL   Comment 3            Comment: Due to the release kinetics of cTnI, a negative result within the first hours of the onset of symptoms does not rule out myocardial infarction with certainty. If myocardial infarction is still suspected, repeat the test at appropriate intervals.    Dg Chest Port 1 View  01/05/2015  CLINICAL DATA:  Chest tightness this morning. EXAM: PORTABLE CHEST 1 VIEW COMPARISON:  07/22/2013 FINDINGS: The heart size and mediastinal contours are within normal limits. Both lungs are clear. The visualized skeletal structures are unremarkable. IMPRESSION: No active disease. Electronically Signed   By: Rolm Baptise M.D.   On: 01/05/2015 08:47    ROS: General:no colds or fevers, no weight changes Skin:no rashes or ulcers HEENT:no blurred vision, no congestion CV:see HPI PUL:see HPI GI:no diarrhea constipation or melena, no indigestion GU:no hematuria, no dysuria MS:no joint pain, no claudication Neuro:no syncope, no lightheadedness Endo:+ diabetes- fairly well controlled, no thyroid disease, they have changed diet to healthier diet   Blood pressure 121/80, pulse 71, temperature 98.3 F (36.8 C), temperature source Oral, resp. rate 16, height _0  (1.676 m), weight 178 lb (80.74 kg), SpO2 98 %. PE: General:Pleasant affect, NAD Skin:Warm and dry, brisk capillary refill HEENT:normocephalic, sclera clear, mucus membranes moist Neck:supple, no JVD, no bruits  Heart:S1S2 RRR without murmur, gallup, rub or click Lungs:clear without rales, rhonchi, or wheezes NOI:BBCW, non tender, + BS, do not palpate liver spleen or masses Ext:no lower ext edema, 2+ pedal pulses, 2+ radial  pulses Neuro:alert and oriented X 3, MAE, follows commands, + facial symmetry    Assessment/Plan Principal Problem:   Unstable angina (HCC)- improved with SL NTG, admit rule out cardiac cath may be most beneficial to know stability of stents. Place IV heparin, NTG paste. Serial troponins.  MD to see. Will place on Plavix but needs to be reloaded post cath.  -plan for cardiac cath.   Active Problems:  CAD (coronary artery disease)- last cath 2014 with stents- overlapping in RCA for in stent restenosis.     HLD (hyperlipidemia)- has stopped statins due to leg cramps    DM2 (diabetes mellitus, type 2) ([place])- on insuln    [facility] R Nurse Practitioner Certified [place] Pager [phone] or after 5pm or weekends call [phone] 01/05/2015, 10:22 AM    Agree with note written by Cecilie Kicks RNP  Pt with known CAD, + CRF, recurrent Sx concerning for [place]. Exam benign. LAbs OK. EKG w/o acute changes. Enz neg. Will start IV hep. Plan cor angio later today. Discussed with Pt and daughter. Medication compliance will be an issue. Stopped his ASA/Plavix and statin drug.   Quay Burow 01/05/2015 11:39 AM

## 2015-01-05 NOTE — Interval H&P Note (Signed)
History and Physical Interval Note:  01/05/2015 3:03 PM  Quintin Altoed L Cu  has presented today for surgery, with the diagnosis of cp  The various methods of treatment have been discussed with the patient and family. After consideration of risks, benefits and other options for treatment, the patient has consented to  Procedure(s): Left Heart Cath and Coronary Angiography (N/A) as a surgical intervention .  The patient's history has been reviewed, patient examined, no change in status, stable for surgery.  I have reviewed the patient's chart and labs.  Questions were answered to the patient's satisfaction.   Cath Lab Visit (complete for each Cath Lab visit)  Clinical Evaluation Leading to the Procedure:   ACS: Yes.    Non-ACS:    Anginal Classification: CCS III  Anti-ischemic medical therapy: Minimal Therapy (1 class of medications)  Non-Invasive Test Results: No non-invasive testing performed  Prior CABG: No previous CABG        Theron Aristaeter [facility] HospitalJordanMD,FACC 01/05/2015 3:03 PM

## 2015-01-05 NOTE — ED Notes (Signed)
Attempted report 

## 2015-01-06 ENCOUNTER — Encounter (HOSPITAL_COMMUNITY): Payer: Self-pay | Admitting: Physician Assistant

## 2015-01-06 DIAGNOSIS — Z9861 Coronary angioplasty status: Secondary | ICD-10-CM

## 2015-01-06 DIAGNOSIS — I2510 Atherosclerotic heart disease of native coronary artery without angina pectoris: Secondary | ICD-10-CM | POA: Diagnosis present

## 2015-01-06 LAB — BASIC METABOLIC PANEL
ANION GAP: 6 (ref 5–15)
BUN: 12 mg/dL (ref 6–20)
CALCIUM: 8.7 mg/dL — AB (ref 8.9–10.3)
CO2: 25 mmol/L (ref 22–32)
Chloride: 107 mmol/L (ref 101–111)
Creatinine, Ser: 0.82 mg/dL (ref 0.61–1.24)
Glucose, Bld: 214 mg/dL — ABNORMAL HIGH (ref 65–99)
Potassium: 3.9 mmol/L (ref 3.5–5.1)
Sodium: 138 mmol/L (ref 135–145)

## 2015-01-06 LAB — HEMOGLOBIN A1C
Hgb A1c MFr Bld: 8.2 % — ABNORMAL HIGH (ref 4.8–5.6)
Mean Plasma Glucose: 189 mg/dL

## 2015-01-06 LAB — CBC
HEMATOCRIT: 34 % — AB (ref 39.0–52.0)
Hemoglobin: 12.1 g/dL — ABNORMAL LOW (ref 13.0–17.0)
MCH: 31.1 pg (ref 26.0–34.0)
MCHC: 35.6 g/dL (ref 30.0–36.0)
MCV: 87.4 fL (ref 78.0–100.0)
PLATELETS: 169 10*3/uL (ref 150–400)
RBC: 3.89 MIL/uL — AB (ref 4.22–5.81)
RDW: 12.9 % (ref 11.5–15.5)
WBC: 8.6 10*3/uL (ref 4.0–10.5)

## 2015-01-06 LAB — GLUCOSE, CAPILLARY: GLUCOSE-CAPILLARY: 179 mg/dL — AB (ref 65–99)

## 2015-01-06 MED ORDER — CLOPIDOGREL BISULFATE 75 MG PO TABS: 75.0000 mg | ORAL_TABLET | Freq: Every day | ORAL | Status: DC

## 2015-01-06 MED ORDER — SIMVASTATIN 20 MG PO TABS: 20.0000 mg | ORAL_TABLET | Freq: Every day | ORAL | Status: DC

## 2015-01-06 MED ORDER — METOPROLOL TARTRATE 25 MG PO TABS: 12.5000 mg | ORAL_TABLET | Freq: Two times a day (BID) | ORAL | Status: DC

## 2015-01-06 MED ORDER — ASPIRIN 81 MG PO TBEC: 81.0000 mg | DELAYED_RELEASE_TABLET | Freq: Every day | ORAL | Status: AC

## 2015-01-06 NOTE — Discharge Summary (Signed)
Discharge Summary   Patient ID: Danny [name] MRN: [id number], DOB/AGE: 49-Jan-1967 49 y.o. Admit date: 01/05/2015 D/C date:     01/06/2015  Primary Cardiologist: Dr. Percival Spanish   Principal Problem:   Unstable angina ([place]) Active Problems:   HLD (hyperlipidemia)   DM2 (diabetes mellitus, type 2) (HCC)   CAD (coronary artery disease)    Admission Dates: 01/05/15-01/06/15 Discharge Diagnosis: [place] s/p DES to LCx  HPI: Danny [name] is a 49 y.o. male with a history of CAD s/p remote stenting of the RCA in 2006 and overlapping DESx2 in 2014 for ISR, DM2, HLD, and obesity who presented with sx concerning for [place].   His last cath in 2014 revealed oLM 20%, LAD 40-50%, pD1 50%, mCFX 30-40%, mRCA stent with severe ISR and 70% beyond the stent, dRCA 50%, pPDA 50%, pPLA 30-40%. EF 55-65%. => PCI: Overlapping Promus DES x2 to the mid RCA ISR placed.   The patient reported that he takes ASA maybe twice a week and stopped plavix 2 months ago. He had not taken his statin for 7-8 months due to leg cramps. Is on insulin for his diabetes. He had 4-5 days of DOE and chest tightness. This is somewhat different than previous episodes of angina; however, he was concerned enough and called EMS.  Hospital Course  [place]- s/p [facility] 01/05/15 which revealed  1. Severe single vessel obstructive CAD  2. Continued patency of the RCA stents   3. Normal LV function  4. Successful stenting of the mid LCx with a DES. -- Continue DAPT w/ ASA/plavix for at least one year. Continue BB and statin.  HLD- LDL 105. Goal < 70. Resumed on statin   Uncontrolled DM2- cont home regmimen. HgA1c 8.2. Follow up with PCP -- Metformin on his med list but notes say he is not taking this. He knows this must be held for 48 hours post contrast dye exposure. He has an appointment with his PCP next week and Danny [name] work on his medications and DM control.   The patient has had an uncomplicated hospital course and is recovering well.  The radial catheter site is stable. He has been seen by Dr. Curt Bears today and deemed ready for discharge home. Discharge medications are listed below.   Discharge Vitals: Blood pressure 113/61, pulse 75, temperature 97.9 F (36.6 C), temperature source Oral, resp. rate 18, height _0  (1.651 m), weight 189 lb 9.5 oz (86 kg), SpO2 99 %.  Labs: Lab Results  Component Value Date   WBC 8.6 01/06/2015   HGB 12.1* 01/06/2015   HCT 34.0* 01/06/2015   MCV 87.4 01/06/2015   PLT 169 01/06/2015     Recent Labs Lab 01/06/15 0422  NA 138  K 3.9  CL 107  CO2 25  BUN 12  CREATININE 0.82  CALCIUM 8.7*  GLUCOSE 214*    Recent Labs  01/05/15 1245  TROPONINI <0.03   Lab Results  Component Value Date   CHOL 208* 01/05/2015   HDL 29* 01/05/2015   LDLCALC 105* 01/05/2015   TRIG 372* 01/05/2015   No results found for: DDIMER  Diagnostic Studies/Procedures   Dg Chest Port 1 View  01/05/2015  CLINICAL DATA:  Chest tightness this morning. EXAM: PORTABLE CHEST 1 VIEW COMPARISON:  07/22/2013 FINDINGS: The heart size and mediastinal contours are within normal limits. Both lungs are clear. The visualized skeletal structures are unremarkable. IMPRESSION: No active disease. Electronically Signed   By: Rolm Baptise M.D.  On: 01/05/2015 08:47   01/05/15 Procedures    Coronary Stent Intervention   Left Heart Cath and Coronary Angiography    Conclusion     Prox RCA to Mid RCA lesion, 30% stenosed.  Prox RCA lesion, 40% stenosed. The lesion was previously treated with a drug-eluting stentgreater than two years ago.  Dist RCA-1 lesion, 50% stenosed.  Dist RCA-2 lesion, 30% stenosed.  Prox LAD to Mid LAD lesion, 25% stenosed.  1st Diag lesion, 25% stenosed.  Prox Cx to Mid Cx lesion, 30% stenosed.  The left ventricular systolic function is normal.  Mid Cx lesion, 95% stenosed. Post intervention, there is a 0% residual stenosis.  1. Severe single vessel obstructive CAD 2.  Continued patency of the RCA stents  3. Normal LV function 4. Successful stenting of the mid LCx with a DES.  Plan: DAPT for at least one year. Anticipate DC in am. Risk factor modification.     Indications               Coronary Findings    Dominance: Right   Left Main  The vessel was injectedis normal in caliberis angiographically normal.     Left Anterior Descending   . Prox LAD to Mid LAD lesion, 25% stenosed.   . First Diagonal Branch   . 1st Diag lesion, 25% stenosed.     Left Circumflex   . Prox Cx to Mid Cx lesion, 30% stenosed. diffuse.   Marland Kitchen PCI: The pre-interventional distal flow is decreased (TIMI 2). Pre-stent angioplasty was performed. 2.5 mm balloon A drug-eluting stent was placed. Post-stent angioplasty was performed. 3.25 mm [place] balloon Maximum pressure: 16 atm. The post-interventional distal flow is normal (TIMI 3). The intervention was successful. No complications occurred at this lesion.  . Supplies used: STENT PROMUS PREM MR 3.0X12  . There is no residual stenosis post intervention.     . Mid Cx lesion, 95% stenosed. discrete.   Marland Kitchen PCI: The pre-interventional distal flow is decreased (TIMI 2). Pre-stent angioplasty was performed. 2.5 mm balloon A drug-eluting stent was placed. Post-stent angioplasty was performed. 3.25 mm [place] balloon Maximum pressure: 16 atm. The post-interventional distal flow is normal (TIMI 3). The intervention was successful. No complications occurred at this lesion.  . Supplies used: STENT PROMUS PREM MR 3.0X12  . There is no residual stenosis post intervention.       Right Coronary Artery   . Prox RCA lesion, 40% stenosed. discrete. The lesion was previously treated with a drug-eluting stentgreater than two years ago.   . Prox RCA to Mid RCA lesion, 30% stenosed.   . Dist RCA-1 lesion, 50% stenosed.   . Dist RCA-2 lesion, 30% stenosed. diffuse.      Wall Motion                 Left Heart    Left Ventricle The left  ventricular size is normal. The left ventricular systolic function is normal. The left ventricular ejection fraction is 55-65% by visual estimate. There are no wall motion abnormalities in the left ventricle.    Coronary Diagrams    Diagnostic Diagram           Post-Intervention Diagram             Discharge Medications     Medication List    STOP taking these medications        aspirin 325 MG tablet  Replaced by:  aspirin 81 MG EC tablet  metFORMIN 1000 MG tablet  Commonly known as:  GLUCOPHAGE     metFORMIN 500 MG tablet  Commonly known as:  GLUCOPHAGE      TAKE these medications        aspirin 81 MG EC tablet  Take 1 tablet (81 mg total) by mouth daily.     clopidogrel 75 MG tablet  Commonly known as:  PLAVIX  Take 1 tablet (75 mg total) by mouth daily.     glimepiride 4 MG tablet  Commonly known as:  AMARYL  Take 4 mg by mouth 2 (two) times daily before a meal.     insulin aspart 100 UNIT/ML injection  Commonly known as:  novoLOG  Inject 1-10 Units into the skin 3 (three) times daily before meals. Per carb counting     LANTUS SOLOSTAR 100 UNIT/ML injection  Generic drug:  insulin glargine  Inject 45 Units into the skin at bedtime.     metoprolol tartrate 25 MG tablet  Commonly known as:  LOPRESSOR  Take 0.5 tablets (12.5 mg total) by mouth 2 (two) times daily.     NITROSTAT 0.4 MG SL tablet  Generic drug:  nitroGLYCERIN  PLACE 1 TABLET (0.4 MG TOTAL) UNDER THE TONGUE EVERY 5 (FIVE) MINUTES AS NEEDED FOR CHEST PAIN.     OVER THE COUNTER MEDICATION  Take 400 mg by mouth 2 (two) times daily. Jymnema Sylvestre - blood glucose control     simvastatin 20 MG tablet  Commonly known as:  ZOCOR  Take 1 tablet (20 mg total) by mouth daily at 6 PM.        Disposition   The patient Danny [name] be discharged in stable condition to home.  Follow-up Information    Follow up with Minus Breeding, MD.   Specialty:  Cardiology   Why:  The office Robynn Marcel  call you to make an appoinment., If you do not hear from them, please contact them., You should be seen within 1-2 weeks.   Contact information:   [place] [phone]         Duration of Discharge Encounter: Greater than 30 minutes including physician and PA time.  SignedAngelena Form R PA-C 01/06/2015, 9:20 AM   I have seen and examined this patient with Angelena Form.  Agree with above, note added to reflect my findings.  On exam, regular rhythm, no murmurs.  Discussed importance of DAPT with patient.  Voices understanding currently.  Plan to discharge on DAPT with follow up scheduled.    Sible Straley M. Laquanda Bick MD 01/06/2015 11:31 AM

## 2015-01-06 NOTE — Progress Notes (Signed)
CARDIAC REHAB PHASE I   PRE:  Rate/Rhythm: 74 SR  BP:  Supine: 113/60  Sitting:   Standing:    SaO2:   MODE:  Ambulation: 1000 ft   POST:  Rate/Rhythm: 89 SR  BP:  Supine:   Sitting: 113/61  Standing:    SaO2:  [id number] Pt tolerated ambulation well. On the way back from walk, he c/o of slight pressure in right chest. The pressure was relieved with rest. VS stable Pt to side of bed after walk with call light in reach. Completed discharge education with pt. and wife. They voice understanding.  Melina CopaLisa Victor Granados RN 01/06/2015 9:32 AM

## 2015-01-06 NOTE — Progress Notes (Signed)
SUBJECTIVE: The patient is doing well today.  At this time, he denies chest pain, shortness of breath, or any new concerns.  Marland Kitchen. aspirin EC  81 mg Oral Daily  . clopidogrel  75 mg Oral Daily  . insulin aspart  5 Units Subcutaneous TID AC  . insulin glargine  45 Units Subcutaneous QHS  . metoprolol tartrate  12.5 mg Oral BID  . simvastatin  20 mg Oral q1800  . sodium chloride  3 mL Intravenous Q12H   . sodium chloride      OBJECTIVE: Physical Exam: Filed Vitals:   01/05/15 1900 01/05/15 1930 01/05/15 1933 01/06/15 0403  BP: 106/60 109/62 109/62 101/60  Pulse: 79 75 80 64  Temp:   98.1 F (36.7 C) 97.7 F (36.5 C)  TempSrc:   Oral Oral  Resp: 14 13 15 20   Height:      Weight:    189 lb 9.5 oz (86 kg)  SpO2: 96% 96% 98% 99%    Intake/Output Summary (Last 24 hours) at 01/06/15 0806 Last data filed at 01/06/15 0532  Gross per 24 hour  Intake 1348.48 ml  Output   1200 ml  Net 148.48 ml    Telemetry reveals sinus rhythm  GEN- The patient is well appearing, alert and oriented x 3 today.   Head- normocephalic, atraumatic Eyes-  Sclera clear, conjunctiva pink Ears- hearing intact Oropharynx- clear Neck- supple, no JVP Lymph- no cervical lymphadenopathy Lungs- Clear to ausculation bilaterally, normal work of breathing Heart- Regular rate and rhythm, no murmurs, rubs or gallops, PMI not laterally displaced GI- soft, NT, ND, + BS Extremities- no clubbing, cyanosis, or edema Skin- no rash or lesion Psych- euthymic mood, full affect Neuro- strength and sensation are intact  LABS: Basic Metabolic Panel:  Recent Labs  16/12/9608/14/16 0848 01/05/15 1245 01/06/15 0422  NA 138  --  138  K 4.4  --  3.9  CL 104  --  107  CO2 28  --  25  GLUCOSE 204*  --  214*  BUN 9  --  12  CREATININE 0.91  --  0.82  CALCIUM 9.4  --  8.7*  MG  --  2.0  --    Liver Function Tests: No results for input(s): AST, ALT, ALKPHOS, BILITOT, PROT, ALBUMIN in the last 72 hours. No results for  input(s): LIPASE, AMYLASE in the last 72 hours. CBC:  Recent Labs  01/05/15 0848 01/06/15 0422  WBC 6.8 8.6  HGB 14.5 12.1*  HCT 41.6 34.0*  MCV 86.5 87.4  PLT 206 169   Cardiac Enzymes:  Recent Labs  01/05/15 1245  TROPONINI <0.03   BNP: Invalid input(s): POCBNP D-Dimer: No results for input(s): DDIMER in the last 72 hours. Hemoglobin A1C:  Recent Labs  01/05/15 1245  HGBA1C 8.2*   Fasting Lipid Panel:  Recent Labs  01/05/15 1244  CHOL 208*  HDL 29*  LDLCALC 105*  TRIG 372*  CHOLHDL 7.2   Thyroid Function Tests:  Recent Labs  01/05/15 1245  TSH 1.298   Anemia Panel: No results for input(s): VITAMINB12, FOLATE, FERRITIN, TIBC, IRON, RETICCTPCT in the last 72 hours.  RADIOLOGY: Dg Chest Port 1 View  01/05/2015  CLINICAL DATA:  Chest tightness this morning. EXAM: PORTABLE CHEST 1 VIEW COMPARISON:  07/22/2013 FINDINGS: The heart size and mediastinal contours are within normal limits. Both lungs are clear. The visualized skeletal structures are unremarkable. IMPRESSION: No active disease. Electronically Signed   By: Charlett NoseKevin  Dover M.D.  On: 01/05/2015 08:47    ASSESSMENT AND PLAN:  Principal Problem:   Unstable angina (HCC) Active Problems:   CAD (coronary artery disease)   HLD (hyperlipidemia)   DM2 (diabetes mellitus, type 2) (HCC)  Feeling well today. Plan for discharge on aspirin and plavix.  Continue metoprolol.  Will Jorja Loa, MD 01/06/2015 8:06 AM

## 2015-01-06 NOTE — Discharge Instructions (Signed)

## 2015-01-08 ENCOUNTER — Telehealth: Payer: Self-pay | Admitting: Cardiology

## 2015-01-08 ENCOUNTER — Encounter (HOSPITAL_COMMUNITY): Payer: Self-pay | Admitting: Cardiology

## 2015-01-08 MED FILL — Heparin Sodium (Porcine) 2 Unit/ML in Sodium Chloride 0.9%: INTRAMUSCULAR | Qty: 1000 | Status: AC

## 2015-01-08 MED FILL — Lidocaine HCl Local Preservative Free (PF) Inj 1%: INTRAMUSCULAR | Qty: 30 | Status: AC

## 2015-01-08 MED FILL — Clopidogrel Bisulfate Tab 300 MG (Base Equiv): ORAL | Qty: 2 | Status: AC

## 2015-01-08 NOTE — Telephone Encounter (Signed)
D/C phone call .Marland Kitchen. Appt is on 01/24/2015 at 3:00pm w/[facility] .  Thanks

## 2015-01-08 NOTE — Telephone Encounter (Signed)
[facility] 01[facility].  Patient understands to follow up with provider Wilburt FinlayBryan Hager on 01/24/15 at 3:00pm [facility]. Patient understands discharge instructions? Yes Patient understands medications and regiment? Yes Patient understands to bring all medications to this visit? Yes

## 2015-01-24 ENCOUNTER — Ambulatory Visit: Payer: BLUE CROSS/BLUE SHIELD | Admitting: Physician Assistant

## 2015-02-02 ENCOUNTER — Ambulatory Visit: Payer: BLUE CROSS/BLUE SHIELD | Admitting: Physician Assistant

## 2015-02-09 ENCOUNTER — Ambulatory Visit (INDEPENDENT_AMBULATORY_CARE_PROVIDER_SITE_OTHER): Payer: BLUE CROSS/BLUE SHIELD | Admitting: Physician Assistant

## 2015-02-09 ENCOUNTER — Encounter: Payer: Self-pay | Admitting: Physician Assistant

## 2015-02-09 VITALS — BP 130/76 | HR 68 | Ht 65.75 in | Wt 183.2 lb

## 2015-02-09 DIAGNOSIS — E785 Hyperlipidemia, unspecified: Secondary | ICD-10-CM | POA: Diagnosis not present

## 2015-02-09 DIAGNOSIS — I2510 Atherosclerotic heart disease of native coronary artery without angina pectoris: Secondary | ICD-10-CM | POA: Diagnosis not present

## 2015-02-09 DIAGNOSIS — E1159 Type 2 diabetes mellitus with other circulatory complications: Secondary | ICD-10-CM | POA: Diagnosis not present

## 2015-02-09 MED ORDER — CLOPIDOGREL BISULFATE 75 MG PO TABS: 75.0000 mg | ORAL_TABLET | Freq: Every day | ORAL | Status: DC

## 2015-02-09 NOTE — Addendum Note (Signed)
Addended by: Barrie DunkerHOMAS, Magaby Rumberger N on: 02/09/2015 10:03 AM   Modules accepted: Orders

## 2015-02-09 NOTE — Patient Instructions (Signed)
Your physician wants you to follow-up in: 3 Months with Dr Hochrein. You will receive a reminder letter in the mail two months in advance. If you don't receive a letter, please call our office to schedule the follow-up appointment.  

## 2015-02-09 NOTE — Progress Notes (Signed)
Patient ID: Danny [name], male   DOB: Feb 15, 1966, 49 y.o.   MRN: [id number]    Date:  02/09/2015   ID:  Danny [name], DOB [birth year], MRN [id number]  PCP:  Pcp Not In System  Primary Cardiologist:  Hochrein  Chief Complaint  Patient presents with  . Follow-up    2 week post CATH with stent//pt states no Sx.; trying to exercise more and states he is doing well.     History of Present Illness: Danny [name] is a 49 y.o. male with a history of CAD s/p remote stenting of the RCA in 2006 and overlapping DESx2 in 2014 for ISR, DM2, HLD, and obesity who presented with sx concerning for [place] on 01/05/15.  His last cath in 2014 revealed oLM 20%, LAD 40-50%, pD1 50%, mCFX 30-40%, mRCA stent with severe ISR and 70% beyond the stent, dRCA 50%, pPDA 50%, pPLA 30-40%. EF 55-65%. => PCI: Overlapping Promus DES x2 to the mid RCA ISR placed.   Prior to admission the patient reported taking ASA maybe twice a week and stopped plavix 2 months before. He had not taken his statin for 7-8 months due to leg cramps. He is on insulin for his diabetes. He had 4-5 days of DOE and chest tightness. This is somewhat different than previous episodes of angina; however, he was concerned enough and called EMS.  Patient underwent left heart catheterization revealing single vessel obstructive disease in the mid circumflex. This was treated with a stent. RCA stents were patent. Had normal LV function. He was continued on aspirin, Plavix, beta blocker and statin.  A1c was 8.2. He was asked to follow-up with his primary care provider.  The patient is here for posthospital follow-up.  He has noticed a definite increase in energy level. He is walking daily about 2 miles per day.   He followed up with his primary care provider regarding his diabetes.   He feels that the beta blocker is increasing his heart rate.  The patient currently denies nausea, vomiting, fever, chest pain, shortness of breath, orthopnea, dizziness, PND,  cough, congestion, abdominal pain, hematochezia, melena, lower extremity edema, claudication.  Post-Intervention Diagram           Wt Readings from Last 3 Encounters:  02/09/15 183 lb 3.2 oz (83.099 kg)  01/06/15 189 lb 9.5 oz (86 kg)  07/21/13 183 lb (83.008 kg)     Past Medical History  Diagnosis Date  . CAD (coronary artery disease)     a. COSTAR study DES Stent to RCA 2006;  b. Myoview 10/08:  No ischemia, EF 72%.  c.  ETT 6/12:  Ex 9:00, no ischemic changes;  d. LHC  05/19/12: oLM 20%, LAD 40-50%, pD1 50%, mCFX 30-40%, mRCA stent with severe ISR and 70% beyond the stent, dRCA 50%, pPDA 50%, pPLA 30-40%.  EF 55-65%. =>  PCI:  Overlapping Promus DES x2 to the mid RCA ISR.  b. [place] s/p DES to LCx  . HLD (hyperlipidemia)   . Obesity   . DM2 (diabetes mellitus, type 2) (HCC)   . GERD (gastroesophageal reflux disease)     Current Outpatient Prescriptions  Medication Sig Dispense Refill  . aspirin EC 81 MG EC tablet Take 1 tablet (81 mg total) by mouth daily.    . clopidogrel (PLAVIX) 75 MG tablet Take 1 tablet (75 mg total) by mouth daily. 30 tablet 10  . Coenzyme Q10 (CO Q 10) 100 MG CAPS Take 100 mg by mouth  daily.    . glimepiride (AMARYL) 4 MG tablet Take 4 mg by mouth 2 (two) times daily before a meal.    . insulin aspart (NOVOLOG) 100 UNIT/ML injection Inject 1-10 Units into the skin 3 (three) times daily before meals. Per carb counting    . insulin glargine (LANTUS SOLOSTAR) 100 UNIT/ML injection Inject 45 Units into the skin at bedtime.    . metFORMIN (GLUCOPHAGE) 500 MG tablet Take 1 tablet by mouth 2 (two) times daily.    . metoprolol tartrate (LOPRESSOR) 25 MG tablet Take 0.5 tablets (12.5 mg total) by mouth 2 (two) times daily. 30 tablet 11  . NITROSTAT 0.4 MG SL tablet PLACE 1 TABLET (0.4 MG TOTAL) UNDER THE TONGUE EVERY 5 (FIVE) MINUTES AS NEEDED FOR CHEST PAIN. 25 tablet 2  . OVER THE COUNTER MEDICATION Take 400 mg by mouth 2 (two) times daily. Jymnema Sylvestre -  blood glucose control    . simvastatin (ZOCOR) 20 MG tablet Take 1 tablet (20 mg total) by mouth daily at 6 PM. 30 tablet 11   No current facility-administered medications for this visit.    Allergies:    Allergies  Allergen Reactions  . Ampicillin     Digestive reaction at age 49  . Penicillins     Digestive reaction    Social History:  The patient  reports that he has never smoked. He has never used smokeless tobacco. He reports that he drinks alcohol. He reports that he does not use illicit drugs.   Family history:   Family History  Problem Relation Age of Onset  . Diabetes Mother   . Coronary artery disease Father [id number]  . Heart attack Father     ROS:  Please see the history of present illness.  All other systems reviewed and negative.   PHYSICAL EXAM: VS:  BP 130/76 mmHg  Pulse 68  Ht 5' 5.75" (1.67 m)  Wt 183 lb 3.2 oz (83.099 kg)  BMI 29.80 kg/m2 Well nourished, well developed, in no acute distress HEENT: Pupils are equal round react to light accommodation extraocular movements are intact.  Neck: no JVDNo cervical lymphadenopathy. Cardiac: Regular rate and rhythm without murmurs rubs or gallops. Lungs:  clear to auscultation bilaterally, no wheezing, rhonchi or rales Abd: soft, nontender, positive bowel sounds all quadrants Ext: no lower extremity edema.  2+ radial and dorsalis pedis pulses. Skin: warm and dry Neuro:  Grossly normal  EKG: Normal sinus rhythm rate 68 bpm  ASSESSMENT AND PLAN:  Problem List Items Addressed This Visit    HLD (hyperlipidemia) - Primary   DM2 (diabetes mellitus, type 2) (HCC)   Relevant Medications   metFORMIN (GLUCOPHAGE) 500 MG tablet   CAD (coronary artery disease)     Hyperlipidemia:  Continue statin  Coronary artery disease:  Status post stenting to the mid circumflex. Continue aspirin, Plavix, beta blocker and statin  Diabetes mellitus type 2 this is followed by primary care provider. His last A1c was 8.2. We also  discussed the importance of getting this A1c under control. Patient appears to be very focused on it at the moment. He is overweight but not obese.  Plavix was refilled.  Follow-up in 3 months

## 2015-04-27 DIAGNOSIS — G8929 Other chronic pain: Secondary | ICD-10-CM | POA: Insufficient documentation

## 2015-05-09 NOTE — Progress Notes (Signed)
HPI The patient returns for followup of coronary disease.  He has had several coronary interventions. He had stenting of his right coronary in 2006 and overlapping drug-eluting stents 2 in 2014 for in-stent restenosis. He was admitted in October.  He had 30% mid RCA stenosis he had some nonobstructive LAD disease. Mid circumflex was 95% and he was treated with drug-eluting stent.  Since I last saw him he has done well.   The patient denies any new symptoms such as chest discomfort, neck or arm discomfort. There has been no new shortness of breath, PND or orthopnea. There have been no reported palpitations, presyncope or syncope.   He is walking and working out.  He has none of the complaints that his he has had with his previous disease.     Allergies  Allergen Reactions  . Ampicillin     Digestive reaction at age 66  . Penicillins     Digestive reaction    Current Outpatient Prescriptions  Medication Sig Dispense Refill  . aspirin EC 81 MG EC tablet Take 1 tablet (81 mg total) by mouth daily.    . clopidogrel (PLAVIX) 75 MG tablet Take 1 tablet (75 mg total) by mouth daily. 30 tablet 10  . insulin aspart (NOVOLOG) 100 UNIT/ML injection Inject 1-10 Units into the skin 3 (three) times daily before meals. Per carb counting    . insulin glargine (LANTUS SOLOSTAR) 100 UNIT/ML injection Inject 45 Units into the skin at bedtime.    . metFORMIN (GLUCOPHAGE) 500 MG tablet Take 1 tablet by mouth 2 (two) times daily.    . metoprolol tartrate (LOPRESSOR) 25 MG tablet Take 0.5 tablets (12.5 mg total) by mouth 2 (two) times daily. 30 tablet 11  . NITROSTAT 0.4 MG SL tablet PLACE 1 TABLET (0.4 MG TOTAL) UNDER THE TONGUE EVERY 5 (FIVE) MINUTES AS NEEDED FOR CHEST PAIN. 25 tablet 2  . simvastatin (ZOCOR) 20 MG tablet Take 1 tablet (20 mg total) by mouth daily at 6 PM. 30 tablet 11   No current facility-administered medications for this visit.    Past Medical History  Diagnosis Date  . CAD  (coronary artery disease)     a. COSTAR study DES Stent to RCA 2006;  b. Myoview 10/08:  No ischemia, EF 72%.  c.  ETT 6/12:  Ex 9:00, no ischemic changes;  d. LHC  05/19/12: oLM 20%, LAD 40-50%, pD1 50%, mCFX 30-40%, mRCA stent with severe ISR and 70% beyond the stent, dRCA 50%, pPDA 50%, pPLA 30-40%.  EF 55-65%. =>  PCI:  Overlapping Promus DES x2 to the mid RCA ISR.  b. [place] s/p DES to LCx  . HLD (hyperlipidemia)   . Obesity   . DM2 (diabetes mellitus, type 2) (HCC)   . GERD (gastroesophageal reflux disease)     Past Surgical History  Procedure Laterality Date  . Coronary angioplasty with stent placement  05/19/2012    DES   to RCA   by Dr Excell Seltzer  . Percutaneous coronary stent intervention (pci-s) N/A 05/19/2012    Procedure: PERCUTANEOUS CORONARY STENT INTERVENTION (PCI-S);  Surgeon: Tonny Bollman, MD;  Location: [facility] CATH LAB;  Service: Cardiovascular;  Laterality: N/A;  . Coronary stent placement  01/05/2015    mid cx  des  . Cardiac catheterization N/A 01/05/2015    Procedure: Left Heart Cath and Coronary Angiography;  Surgeon: Peter M Swaziland, MD;  Location: [facility] INVASIVE CV LAB;  Service: Cardiovascular;  Laterality: N/A;  . Cardiac catheterization  01/05/2015    Procedure: Coronary Stent Intervention;  Surgeon: Peter M Swaziland, MD;  Location: [facility] INVASIVE CV LAB;  Service: Cardiovascular;;    ROS:  As stated in the HPI and negative for all other systems.  PHYSICAL EXAM BP 122/78 mmHg  Pulse 76  Ht  (1.676 m)  Wt 192 lb 2 oz (87.147 kg)  BMI 31.02 kg/m2 GENERAL:  Well appearing HEENT:  Pupils equal round and reactive, fundi not visualized, oral mucosa unremarkable NECK:  No jugular venous distention, waveform within normal limits, carotid upstroke brisk and symmetric, questionable left soft bruit, no thyromegaly LUNGS:  Clear to auscultation bilaterally BACK:  No CVA tenderness CHEST:  Unremarkable HEART:  PMI not displaced or sustained,S1 and S2 within normal limits, no S3,  no S4, no clicks, no rubs, no murmurs ABD:  Flat, positive bowel sounds normal in frequency in pitch, no bruits, no rebound, no guarding, no midline pulsatile mass, no hepatomegaly, no splenomegaly EXT:  2 plus pulses throughout, no edema, no cyanosis no clubbing    ASSESSMENT AND PLAN  CAD:  The patient has no new sypmtoms.  No further cardiovascular testing is indicated.  We will continue with aggressive risk reduction and meds as listed.    Hospital records reviewed.    DM:    He will get this checked in a few weeks when he sees his PCP.    Lab Results  Component Value Date   HGBA1C 8.2* 01/05/2015    CAROTID BRUIT:  This has been mild and repeat carotid is to be ordered. Last check in 2013.    DYSLIPIDEMIA:  I would like the LDL to be 70.  It was 105.  He will have a follow up lipid profile.   Lab Results  Component Value Date   CHOL 208* 01/05/2015   TRIG 372* 01/05/2015   HDL 29* 01/05/2015   LDLCALC 105* 01/05/2015   LDLDIRECT 57.7 08/25/2012

## 2015-05-10 ENCOUNTER — Encounter: Payer: Self-pay | Admitting: Cardiology

## 2015-05-10 ENCOUNTER — Ambulatory Visit (INDEPENDENT_AMBULATORY_CARE_PROVIDER_SITE_OTHER): Payer: BLUE CROSS/BLUE SHIELD | Admitting: Cardiology

## 2015-05-10 VITALS — BP 122/78 | HR 76 | Ht 66.0 in | Wt 192.1 lb

## 2015-05-10 DIAGNOSIS — I779 Disorder of arteries and arterioles, unspecified: Secondary | ICD-10-CM

## 2015-05-10 DIAGNOSIS — E118 Type 2 diabetes mellitus with unspecified complications: Secondary | ICD-10-CM | POA: Diagnosis not present

## 2015-05-10 DIAGNOSIS — E785 Hyperlipidemia, unspecified: Secondary | ICD-10-CM

## 2015-05-10 DIAGNOSIS — I739 Peripheral vascular disease, unspecified: Principal | ICD-10-CM

## 2015-05-10 NOTE — Patient Instructions (Signed)
Medication Instructions: Dr Antoine Poche has made no changes to your current medications or treatment plan.  Labwork: Your physician recommends that you return for lab work with you primary care doctor. Please take the lab slips with you.  Testing/Procedures: 1. Carotid doppler - Your physician has requested that you have a carotid duplex. This test is an ultrasound of the carotid arteries in your neck. It looks at blood flow through these arteries that supply the brain with blood. Allow one hour for this exam. There are no restrictions or special instructions.  Follow-up: Dr Antoine Poche recommends that you schedule a follow-up appointment in 1 year. You will receive a reminder letter in the mail two months in advance. If you don't receive a letter, please call our office to schedule the follow-up appointment.  If you need a refill on your cardiac medications before your next appointment, please call your pharmacy.

## 2015-05-15 NOTE — Progress Notes (Signed)
bloodwork to be drawn at PCP

## 2015-05-15 NOTE — Progress Notes (Signed)
Carotid to be completed 3/2

## 2015-05-24 ENCOUNTER — Inpatient Hospital Stay (HOSPITAL_COMMUNITY): Admission: RE | Admit: 2015-05-24 | Payer: BLUE CROSS/BLUE SHIELD | Source: Ambulatory Visit

## 2015-05-31 ENCOUNTER — Ambulatory Visit (HOSPITAL_COMMUNITY)
Admission: RE | Admit: 2015-05-31 | Discharge: 2015-05-31 | Disposition: A | Discharge status: Home / Self Care | Payer: BLUE CROSS/BLUE SHIELD | Source: Ambulatory Visit | Attending: Cardiology | Admitting: Cardiology

## 2015-05-31 DIAGNOSIS — I6523 Occlusion and stenosis of bilateral carotid arteries: Secondary | ICD-10-CM | POA: Insufficient documentation

## 2015-05-31 DIAGNOSIS — E785 Hyperlipidemia, unspecified: Secondary | ICD-10-CM | POA: Diagnosis not present

## 2015-05-31 DIAGNOSIS — E119 Type 2 diabetes mellitus without complications: Secondary | ICD-10-CM | POA: Insufficient documentation

## 2015-05-31 DIAGNOSIS — I779 Disorder of arteries and arterioles, unspecified: Secondary | ICD-10-CM

## 2015-05-31 DIAGNOSIS — I739 Peripheral vascular disease, unspecified: Secondary | ICD-10-CM

## 2015-06-27 DIAGNOSIS — G8929 Other chronic pain: Secondary | ICD-10-CM | POA: Diagnosis not present

## 2015-06-27 DIAGNOSIS — N200 Calculus of kidney: Secondary | ICD-10-CM | POA: Diagnosis not present

## 2015-06-27 DIAGNOSIS — R935 Abnormal findings on diagnostic imaging of other abdominal regions, including retroperitoneum: Secondary | ICD-10-CM | POA: Diagnosis not present

## 2015-06-27 DIAGNOSIS — R109 Unspecified abdominal pain: Secondary | ICD-10-CM | POA: Diagnosis not present

## 2015-06-27 DIAGNOSIS — D1803 Hemangioma of intra-abdominal structures: Secondary | ICD-10-CM | POA: Diagnosis not present

## 2015-07-11 DIAGNOSIS — E7800 Pure hypercholesterolemia, unspecified: Secondary | ICD-10-CM | POA: Diagnosis not present

## 2015-07-11 DIAGNOSIS — B353 Tinea pedis: Secondary | ICD-10-CM | POA: Diagnosis not present

## 2015-07-11 DIAGNOSIS — Z794 Long term (current) use of insulin: Secondary | ICD-10-CM | POA: Diagnosis not present

## 2015-07-11 DIAGNOSIS — M791 Myalgia: Secondary | ICD-10-CM | POA: Diagnosis not present

## 2015-07-11 DIAGNOSIS — M7918 Myalgia, other site: Secondary | ICD-10-CM | POA: Insufficient documentation

## 2015-07-11 DIAGNOSIS — E113299 Type 2 diabetes mellitus with mild nonproliferative diabetic retinopathy without macular edema, unspecified eye: Secondary | ICD-10-CM | POA: Diagnosis not present

## 2015-07-12 DIAGNOSIS — S20461A Insect bite (nonvenomous) of right back wall of thorax, initial encounter: Secondary | ICD-10-CM | POA: Diagnosis not present

## 2015-07-17 ENCOUNTER — Telehealth: Payer: Self-pay | Admitting: Cardiology

## 2015-07-17 NOTE — Telephone Encounter (Signed)
Ok to hold metoprolol

## 2015-07-17 NOTE — Telephone Encounter (Signed)
Spoke with pt, he has noticed for the last 3 to 4 days an increase in palpitations. He reports he will have palpitations usually in the morning and then they will worsen a few hours after he takes the metoprolol. It is listed as a side effect on the information he received from the pharmacy. He has not changed his diet, 2 drinks of caffeine only during the day and he reports no other symptoms. He wopuld like to stop the medication to see if his palpitations improve but wants to get the okay from dr hochrein first. Will forward for dr hochrein's review

## 2015-07-17 NOTE — Telephone Encounter (Signed)
Left message for pt to call.

## 2015-07-17 NOTE — Telephone Encounter (Signed)
New Message  Pt c/o medication issue: 1. Name of Medication: metoprolol tartrate (LOPRESSOR) 25 MG tablet  4. What is your medication issue? Seem to skip heartbeats in the mornings. Just started noticing a few days ago. He has been feeling it for sometime but he felt as if it was something else. Please call back to discuss, Per pt not other symptoms. No SOM or chest pain. This is just new. No dizziness.

## 2015-07-17 NOTE — Telephone Encounter (Signed)
error 

## 2015-07-18 NOTE — Telephone Encounter (Signed)
Spoke with pt letting him know it's ok to hold metoprolol

## 2015-08-07 DIAGNOSIS — G8929 Other chronic pain: Secondary | ICD-10-CM | POA: Diagnosis not present

## 2015-08-07 DIAGNOSIS — M545 Low back pain: Secondary | ICD-10-CM | POA: Diagnosis not present

## 2015-10-18 DIAGNOSIS — H5213 Myopia, bilateral: Secondary | ICD-10-CM | POA: Diagnosis not present

## 2015-10-18 DIAGNOSIS — E119 Type 2 diabetes mellitus without complications: Secondary | ICD-10-CM | POA: Diagnosis not present

## 2015-10-18 DIAGNOSIS — H524 Presbyopia: Secondary | ICD-10-CM | POA: Diagnosis not present

## 2016-01-01 DIAGNOSIS — R072 Precordial pain: Secondary | ICD-10-CM | POA: Diagnosis not present

## 2016-01-01 DIAGNOSIS — Z794 Long term (current) use of insulin: Secondary | ICD-10-CM | POA: Diagnosis not present

## 2016-01-01 DIAGNOSIS — E113299 Type 2 diabetes mellitus with mild nonproliferative diabetic retinopathy without macular edema, unspecified eye: Secondary | ICD-10-CM | POA: Diagnosis not present

## 2016-01-01 DIAGNOSIS — Z6831 Body mass index (BMI) 31.0-31.9, adult: Secondary | ICD-10-CM | POA: Diagnosis not present

## 2016-01-25 ENCOUNTER — Other Ambulatory Visit: Payer: Self-pay | Admitting: Physician Assistant

## 2016-02-24 ENCOUNTER — Other Ambulatory Visit: Payer: Self-pay | Admitting: Physician Assistant

## 2016-02-25 NOTE — Telephone Encounter (Signed)
Rx(s) sent to pharmacy electronically.  

## 2016-05-10 ENCOUNTER — Other Ambulatory Visit: Payer: Self-pay | Admitting: Cardiology

## 2016-06-10 ENCOUNTER — Encounter: Payer: Self-pay | Admitting: Cardiology

## 2016-06-19 ENCOUNTER — Ambulatory Visit: Payer: BLUE CROSS/BLUE SHIELD | Admitting: Cardiology

## 2016-07-02 NOTE — Progress Notes (Signed)
HPI The patient returns for followup of coronary disease.  He has had several coronary interventions. He had stenting of his right coronary in 2006 and overlapping drug-eluting stents 2 in 2014 for in-stent restenosis. He was admitted in October 2016.  He had 30% mid RCA stenosis he had some nonobstructive LAD disease. Mid circumflex was 95% and he was treated with drug-eluting stent.  Since I last saw him he has been exercising routinely and eating a plant based diet.  He has lost weight.  The patient denies any new symptoms such as chest discomfort, neck or arm discomfort. There has been no new shortness of breath, PND or orthopnea. There have been no reported palpitations, presyncope or syncope.  Allergies  Allergen Reactions  . Ampicillin     Digestive reaction at age 65  . Penicillins     Digestive reaction    Current Outpatient Prescriptions  Medication Sig Dispense Refill  . aspirin EC 81 MG EC tablet Take 1 tablet (81 mg total) by mouth daily.    . clopidogrel (PLAVIX) 75 MG tablet TAKE 1 TABLET BY MOUTH EVERY DAY 30 tablet 1  . insulin aspart (NOVOLOG) 100 UNIT/ML injection Inject 1-10 Units into the skin 3 (three) times daily before meals. Per carb counting    . insulin glargine (LANTUS) 100 UNIT/ML injection Inject 25 Units into the skin 2 (two) times daily.    . Liraglutide (VICTOZA [place]) Inject 0.6 mg into the skin daily.    Marland Kitchen NITROSTAT 0.4 MG SL tablet PLACE 1 TABLET (0.4 MG TOTAL) UNDER THE TONGUE EVERY 5 (FIVE) MINUTES AS NEEDED FOR CHEST PAIN. 25 tablet 2  . simvastatin (ZOCOR) 20 MG tablet TAKE 1 TABLET BY MOUTH EVERY DAY AT 6 PM 30 tablet 10   No current facility-administered medications for this visit.     Past Medical History:  Diagnosis Date  . CAD (coronary artery disease)    a. COSTAR study DES Stent to RCA 2006;  b. Myoview 10/08:  No ischemia, EF 72%.  c.  ETT 6/12:  Ex 9:00, no ischemic changes;  d. LHC  05/19/12: oLM 20%, LAD 40-50%, pD1 50%, mCFX 30-40%,  mRCA stent with severe ISR and 70% beyond the stent, dRCA 50%, pPDA 50%, pPLA 30-40%.  EF 55-65%. =>  PCI:  Overlapping Promus DES x2 to the mid RCA ISR.  b. [place] s/p DES to LCx  c.95% circ DES 10/16,  . DM2 (diabetes mellitus, type 2) (HCC)   . GERD (gastroesophageal reflux disease)   . HLD (hyperlipidemia)   . Obesity     Past Surgical History:  Procedure Laterality Date  . CARDIAC CATHETERIZATION N/A 01/05/2015   Procedure: Left Heart Cath and Coronary Angiography;  Surgeon: Peter M Swaziland, MD;  Location: [facility] INVASIVE CV LAB;  Service: Cardiovascular;  Laterality: N/A;  . CARDIAC CATHETERIZATION  01/05/2015   Procedure: Coronary Stent Intervention;  Surgeon: Peter M Swaziland, MD;  Location:  [facility] INVASIVE CV LAB;  Service: Cardiovascular;;  . CORONARY ANGIOPLASTY WITH STENT PLACEMENT  05/19/2012   DES   to RCA   by Dr Excell Seltzer  . CORONARY STENT PLACEMENT  01/05/2015   mid cx  des  . PERCUTANEOUS CORONARY STENT INTERVENTION (PCI-S) N/A 05/19/2012   Procedure: PERCUTANEOUS CORONARY STENT INTERVENTION (PCI-S);  Surgeon: Tonny Bollman, MD;  Location: [facility] CATH LAB;  Service: Cardiovascular;  Laterality: N/A;    ROS:  Tinnitus.  Otherwise as stated in the HPI and negative for all other systems.  PHYSICAL  EXAM BP 127/83   Pulse 82   Ht  (1.676 m)   Wt 184 lb (83.5 kg)   BMI 29.70 kg/m  GENERAL:  Well appearing and in no distress HEENT:  Pupils equal round and reactive, fundi not visualized, oral mucosa unremarkable NECK:  No jugular venous distention, waveform within normal limits, carotid upstroke brisk and symmetric.   LUNGS:  Clear to auscultation bilaterally CHEST:  Unremarkable HEART:  PMI not displaced or sustained,S1 and S2 within normal limits, no S3, no S4, no clicks, no rubs, no murmurs ABD:  Flat, positive bowel sounds normal in frequency in pitch, no bruits, no rebound, no guarding, no midline pulsatile mass, no hepatomegaly, no splenomegaly EXT:  2 plus pulses throughout, no  edema, no cyanosis no clubbing  EKG:  Sinus rhythm, rate 82, axis within normal limits, intervals within normal limits, no acute ST-T wave changes.  ASSESSMENT AND PLAN  CAD:   The patient has no new sypmtoms.  No further cardiovascular testing is indicated.  We will continue with aggressive risk reduction and meds as listed.   I discussed risk benefits of DAPT  With his extensive history he should remain on both agents.     DM:     He will get this checked in a few weeks when he sees his PCP.  I asked him to ask about whether or not he should be no Jardiance.    CAROTID BRUIT:  He had mild progression of carotid plaque on Doppler last year and this should be repeated next year.    DYSLIPIDEMIA:   I would like the LDL to be 70.   New guidelines will be lower in the future.  He will send me his A1C and his lipid levels when drawn by Darnelle Spangle, MD

## 2016-07-03 ENCOUNTER — Encounter: Payer: Self-pay | Admitting: Cardiology

## 2016-07-03 ENCOUNTER — Ambulatory Visit (INDEPENDENT_AMBULATORY_CARE_PROVIDER_SITE_OTHER): Payer: BLUE CROSS/BLUE SHIELD | Admitting: Cardiology

## 2016-07-03 VITALS — BP 127/83 | HR 82 | Ht 66.0 in | Wt 184.0 lb

## 2016-07-03 DIAGNOSIS — E118 Type 2 diabetes mellitus with unspecified complications: Secondary | ICD-10-CM | POA: Diagnosis not present

## 2016-07-03 DIAGNOSIS — I2510 Atherosclerotic heart disease of native coronary artery without angina pectoris: Secondary | ICD-10-CM | POA: Diagnosis not present

## 2016-07-03 DIAGNOSIS — E785 Hyperlipidemia, unspecified: Secondary | ICD-10-CM

## 2016-07-03 NOTE — Patient Instructions (Signed)

## 2016-07-05 ENCOUNTER — Other Ambulatory Visit: Payer: Self-pay | Admitting: Cardiology

## 2016-07-07 NOTE — Telephone Encounter (Signed)
Rx(s) sent to pharmacy electronically.  

## 2016-09-05 IMAGING — CR DG CHEST 1V PORT
1 series · 1 of 1 positions shown · non-contrast
Comparison: 07/22/2013

CLINICAL DATA: Chest tightness this morning.

EXAM:
PORTABLE CHEST 1 VIEW

[AP]
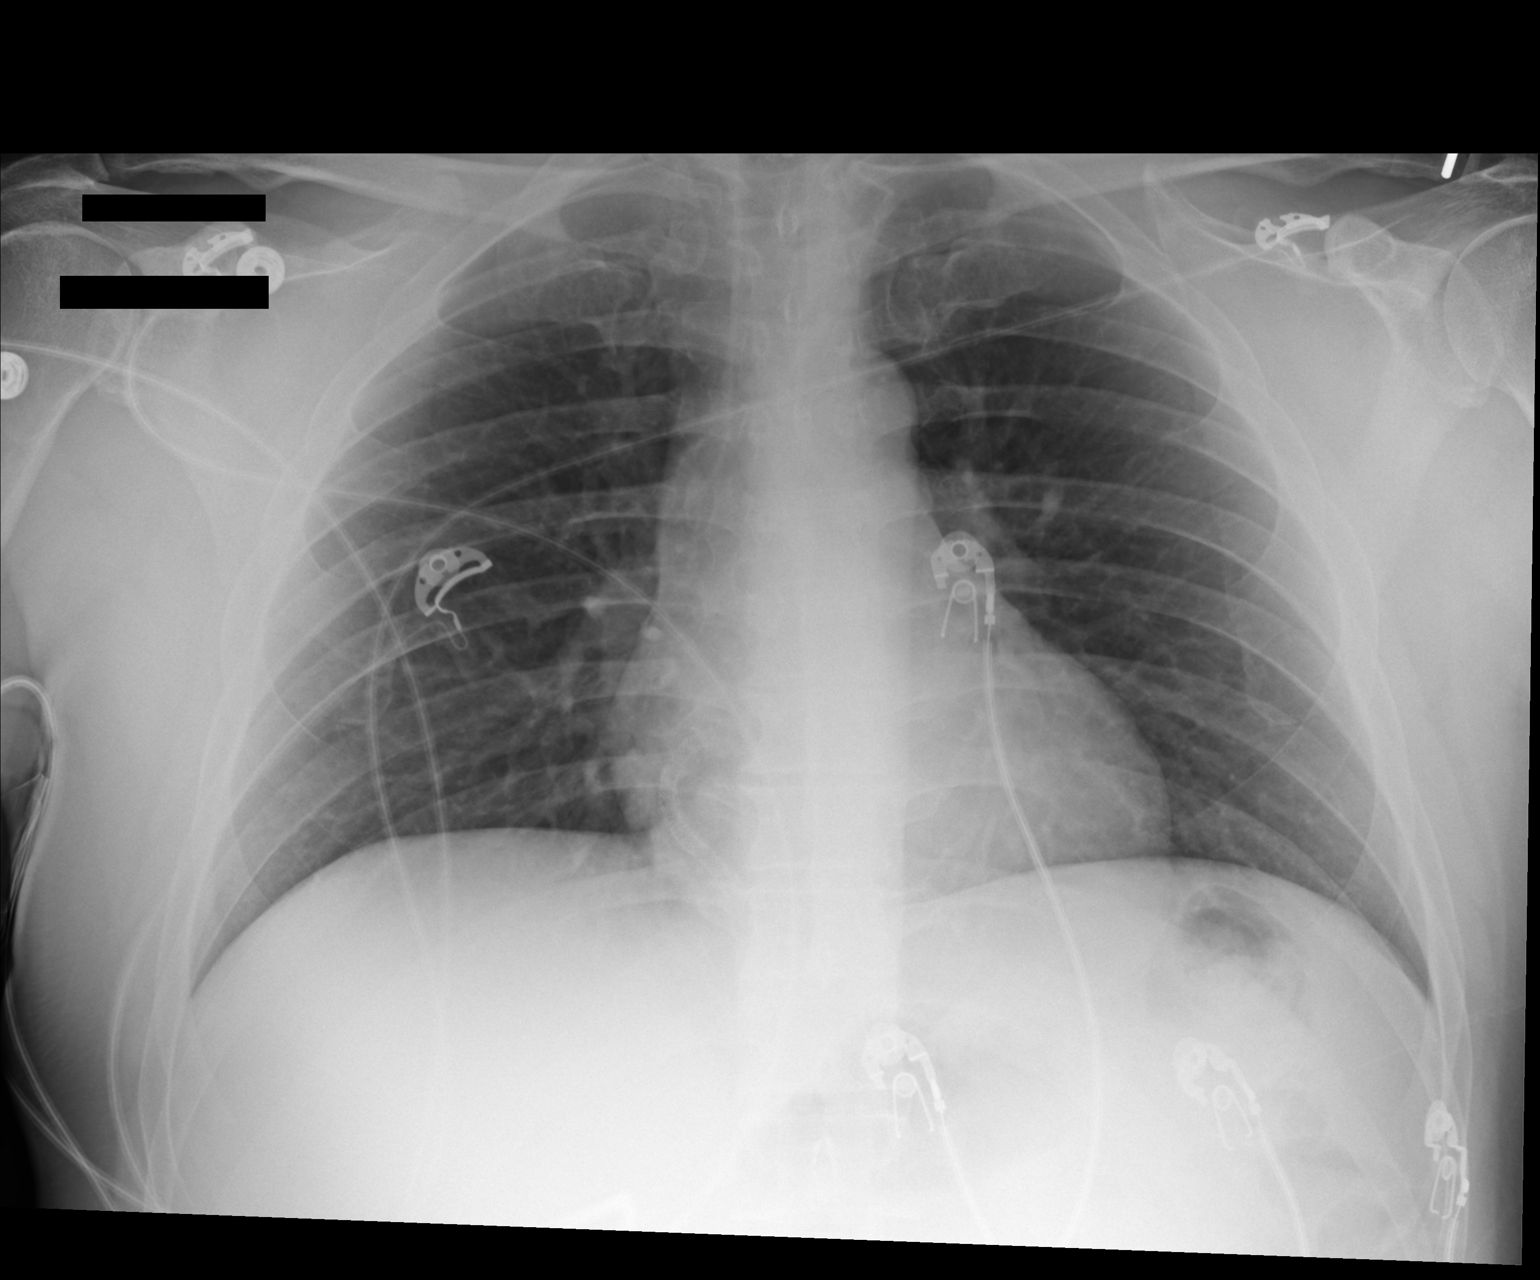

[1 of 1 positions shown; findings below may reference images not displayed]

FINDINGS: The heart size and mediastinal contours are within normal limits.
Both lungs are clear. The visualized skeletal structures are
unremarkable.
IMPRESSION: No active disease.

## 2016-12-22 ENCOUNTER — Telehealth: Payer: Self-pay | Admitting: Cardiology

## 2016-12-22 NOTE — Telephone Encounter (Signed)
S/w pt he states that he is lightheaded but no other sx. He states that he did just come home from a trip visiting his parents and he is driving back right now. He states that his HR is in the 90's and is usually in the 70's. He has not taken his bp and reports that he is taking all medication as ordered. Pt denies any other sx chest pain or pressure, SOB, calf swelling or localized warmth. He states that he may be dehydrated and will increase his po intake. Pt wi;; keep appt 12-23-16 9am with luke. Should he develop any of these sx he will go directly to the ER.

## 2016-12-22 NOTE — Telephone Encounter (Signed)
°  New Prob  Pt reports lightheadedness which began 3 days ago. Requesting to speak to a nurse.

## 2016-12-23 ENCOUNTER — Ambulatory Visit (INDEPENDENT_AMBULATORY_CARE_PROVIDER_SITE_OTHER): Payer: BLUE CROSS/BLUE SHIELD | Admitting: Cardiology

## 2016-12-23 ENCOUNTER — Encounter: Payer: Self-pay | Admitting: Cardiology

## 2016-12-23 VITALS — BP 98/64 | HR 72 | Ht 66.0 in | Wt 173.0 lb

## 2016-12-23 DIAGNOSIS — E785 Hyperlipidemia, unspecified: Secondary | ICD-10-CM | POA: Diagnosis not present

## 2016-12-23 DIAGNOSIS — I951 Orthostatic hypotension: Secondary | ICD-10-CM

## 2016-12-23 DIAGNOSIS — E119 Type 2 diabetes mellitus without complications: Secondary | ICD-10-CM | POA: Diagnosis not present

## 2016-12-23 DIAGNOSIS — I2510 Atherosclerotic heart disease of native coronary artery without angina pectoris: Secondary | ICD-10-CM

## 2016-12-23 DIAGNOSIS — I739 Peripheral vascular disease, unspecified: Secondary | ICD-10-CM

## 2016-12-23 DIAGNOSIS — Z794 Long term (current) use of insulin: Secondary | ICD-10-CM

## 2016-12-23 DIAGNOSIS — I6523 Occlusion and stenosis of bilateral carotid arteries: Secondary | ICD-10-CM

## 2016-12-23 DIAGNOSIS — I779 Disorder of arteries and arterioles, unspecified: Secondary | ICD-10-CM | POA: Diagnosis not present

## 2016-12-23 DIAGNOSIS — Z79899 Other long term (current) drug therapy: Secondary | ICD-10-CM

## 2016-12-23 DIAGNOSIS — IMO0001 Reserved for inherently not codable concepts without codable children: Secondary | ICD-10-CM

## 2016-12-23 LAB — BASIC METABOLIC PANEL
BUN/Creatinine Ratio: 12 (ref 9–20)
BUN: 9 mg/dL (ref 6–24)
CO2: 24 mmol/L (ref 20–29)
Calcium: 10.1 mg/dL (ref 8.7–10.2)
Chloride: 101 mmol/L (ref 96–106)
Creatinine, Ser: 0.74 mg/dL — ABNORMAL LOW (ref 0.76–1.27)
GFR calc Af Amer: 124 mL/min/{1.73_m2} (ref >59)
GFR calc non Af Amer: 108 mL/min/{1.73_m2} (ref >59)
Glucose: 219 mg/dL — ABNORMAL HIGH (ref 65–99)
Potassium: 4.5 mmol/L (ref 3.5–5.2)
Sodium: 138 mmol/L (ref 134–144)

## 2016-12-23 LAB — CBC
Hematocrit: 42.7 % (ref 37.5–51.0)
Hemoglobin: 15 g/dL (ref 13.0–17.7)
MCH: 30.8 pg (ref 26.6–33.0)
MCHC: 35.1 g/dL (ref 31.5–35.7)
MCV: 88 fL (ref 79–97)
Platelets: 224 10*3/uL (ref 150–379)
RBC: 4.87 x10E6/uL (ref 4.14–5.80)
RDW: 13.1 % (ref 12.3–15.4)
WBC: 7.3 10*3/uL (ref 3.4–10.8)

## 2016-12-23 NOTE — Patient Instructions (Addendum)
Medication Instructions: Danny Shelter, PA-C, recommends that you continue on your current medications as directed. Please refer to the Current Medication list given to you today.   Labwork: Your physician recommends that you return for lab work TODAY.  Testing/Procedures: 1. Carotid Artery Doppler - Your physician has requested that you have a carotid duplex. This test is an ultrasound of the carotid arteries in your neck. It looks at blood flow through these arteries that supply the brain with blood. Allow one hour for this exam. There are no restrictions or special instructions.  Follow-up: Danny [name] recommends that you schedule a follow-up appointment in FEBRUARY 2019 with Dr Antoine Poche. You will receive a reminder letter in the mail two months in advance. If you don't receive a letter, please call our office to schedule the follow-up appointment.  If you need a refill on your cardiac medications before your next appointment, please call your pharmacy.  Drink 8 ounces of water per hour while awake!  Orthostatic Hypotension Orthostatic hypotension is a sudden drop in blood pressure that happens when you quickly change positions, such as when you get up from a seated or lying position. Blood pressure is a measurement of how strongly, or weakly, your blood is pressing against the walls of your arteries. Arteries are blood vessels that carry blood from your heart throughout your body. When blood pressure is too low, you may not get enough blood to your brain or to the rest of your organs. This can cause weakness, light-headedness, rapid heartbeat, and fainting. This can last for just a few seconds or for up to a few minutes. Orthostatic hypotension is usually not a serious problem. However, if it happens frequently or gets worse, it may be a sign of something more serious. What are the causes? This condition may be caused by:  Sudden changes in posture, such as standing up quickly after you have been  sitting or lying down.  Blood loss.  Loss of body fluids (dehydration).  Heart problems.  Hormone (endocrine) problems.  Pregnancy.  Severe infection.  Lack of certain nutrients.  Severe allergic reactions (anaphylaxis).  Certain medicines, such as blood pressure medicine or medicines that make the body lose excess fluids (diuretics). Sometimes, this condition can be caused by not taking medicine as directed, such as taking too much of a certain medicine.  What increases the risk? Certain factors can make you more likely to develop orthostatic hypotension, including:  Age. Risk increases as you get older.  Conditions that affect the heart or the central nervous system.  Taking certain medicines, such as blood pressure medicine or diuretics.  Being pregnant.  What are the signs or symptoms? Symptoms of this condition may include:  Weakness.  Light-headedness.  Dizziness.  Blurred vision.  Fatigue.  Rapid heartbeat.  Fainting, in severe cases.  How is this diagnosed? This condition is diagnosed based on:  Your medical history.  Your symptoms.  Your blood pressure measurement. Your health care provider will check your blood pressure when you are: ? Lying down. ? Sitting. ? Standing.  A blood pressure reading is recorded as two numbers, such as "120 over 80" (or 120/80). The first ("top") number is called the systolic pressure. It is a measure of the pressure in your arteries as your heart beats. The second ("bottom") number is called the diastolic pressure. It is a measure of the pressure in your arteries when your heart relaxes between beats. Blood pressure is measured in a unit called mm Hg.  Healthy blood pressure for adults is 120/80. If your blood pressure is below 90/60, you may be diagnosed with hypotension. Other information or tests that may be used to diagnose orthostatic hypotension include:  Your other vital signs, such as your heart rate and  temperature.  Blood tests.  Tilt table test. For this test, you will be safely secured to a table that moves you from a lying position to an upright position. Your heart rhythm and blood pressure will be monitored during the test.  How is this treated? Treatment for this condition may include:  Changing your diet. This may involve eating more salt (sodium) or drinking more water.  Taking medicines to raise your blood pressure.  Changing the dosage of certain medicines you are taking that might be lowering your blood pressure.  Wearing compression stockings. These stockings help to prevent blood clots and reduce swelling in your legs.  In some cases, you may need to go to the hospital for:  Fluid replacement. This means you will receive fluids through an IV tube.  Blood replacement. This means you will receive donated blood through an IV tube (transfusion).  Treating an infection or heart problems, if this applies.  Monitoring. You may need to be monitored while medicines that you are taking wear off.  Follow these instructions at home: Eating and drinking   Drink enough fluid to keep your urine clear or pale yellow.  Eat a healthy diet and follow instructions from your health care provider about eating or drinking restrictions. A healthy diet includes: ? Fresh fruits and vegetables. ? Whole grains. ? Lean meats. ? Low-fat dairy products.  Eat extra salt only as directed. Do not add extra salt to your diet unless your health care provider told you to do that.  Eat frequent, small meals.  Avoid standing up suddenly after eating. Medicines  Take over-the-counter and prescription medicines only as told by your health care provider. ? Follow instructions from your health care provider about changing the dosage of your current medicines, if this applies. ? Do not stop or adjust any of your medicines on your own. General instructions  Wear compression stockings as told by  your health care provider.  Get up slowly from lying down or sitting positions. This gives your blood pressure a chance to adjust.  Avoid hot showers and excessive heat as directed by your health care provider.  Return to your normal activities as told by your health care provider. Ask your health care provider what activities are safe for you.  Do not use any products that contain nicotine or tobacco, such as cigarettes and e-cigarettes. If you need help quitting, ask your health care provider.  Keep all follow-up visits as told by your health care provider. This is important. Contact a health care provider if:  You vomit.  You have diarrhea.  You have a fever for more than 2-3 days.  You feel more thirsty than usual.  You feel weak and tired. Get help right away if:  You have chest pain.  You have a fast or irregular heartbeat.  You develop numbness in any part of your body.  You cannot move your arms or your legs.  You have trouble speaking.  You become sweaty or feel lightheaded.  You faint.  You feel short of breath.  You have trouble staying awake.  You feel confused. This information is not intended to replace advice given to you by your health care provider. Make sure you  discuss any questions you have with your health care provider. Document Released: 02/28/2002 Document Revised: 11/27/2015 Document Reviewed: 08/31/2015 Elsevier Interactive Patient Education  2018 [employer].

## 2016-12-23 NOTE — Assessment & Plan Note (Signed)
I think Mr Danny [name] has orthostatic hypotension secondary to his 20 lb wgt loss, diet change, and autonomic dysfunction from IDDM.

## 2016-12-23 NOTE — Progress Notes (Signed)
12/23/2016 Danny [name]   February 24, 1966  [id number]  Primary Physician Darnelle Spangle, MD Primary Cardiologist:   HPI: 51 y/o male followed by Dr Antoine Poche with a history of CAD. He has had several coronary interventions. He had stenting of his right coronary in 2006 and overlapping drug-eluting stents 2 in 2014 for in-stent restenosis. He was admitted in October 2016. He had 30% mid RCA stenosis he had some nonobstructive LAD disease. Mid circumflex was 95% and he was treated with drug-eluting stent. He had classic angina before this, "like an elephant on my chest". He has not had recurrent chest pain. He has lost 20 lbs over 6 months after changing to a plant based diet. He walks 5 miles a day.   He is in the office today with complaints of dizziness when standing. This had been going on for about a month. He has not had syncope. He says when he stands he feel dizzy and gets a headache. His symptoms last for a minute or so. In the office his B/P went from 108 systolic to 82 systolic when standing with symptoms of dizziness.    Current Outpatient Prescriptions  Medication Sig Dispense Refill  . aspirin EC 81 MG EC tablet Take 1 tablet (81 mg total) by mouth daily.    . clopidogrel (PLAVIX) 75 MG tablet TAKE 1 TABLET BY MOUTH EVERY DAY 30 tablet 11  . insulin aspart (NOVOLOG) 100 UNIT/ML injection Inject 1-10 Units into the skin 3 (three) times daily before meals. Per carb counting    . insulin glargine (LANTUS) 100 UNIT/ML injection Inject 25 Units into the skin 2 (two) times daily.    . Liraglutide (VICTOZA [place]) Inject 0.6 mg into the skin daily.    Marland Kitchen NITROSTAT 0.4 MG SL tablet PLACE 1 TABLET (0.4 MG TOTAL) UNDER THE TONGUE EVERY 5 (FIVE) MINUTES AS NEEDED FOR CHEST PAIN. 25 tablet 2  . simvastatin (ZOCOR) 20 MG tablet TAKE 1 TABLET BY MOUTH EVERY DAY AT 6 PM 30 tablet 10   No current facility-administered medications for this visit.     Allergies  Allergen Reactions  . Ampicillin  Nausea And Vomiting    Digestive reaction at age 50  . Penicillins Nausea And Vomiting    Digestive reaction    Past Medical History:  Diagnosis Date  . CAD (coronary artery disease)    a. COSTAR study DES Stent to RCA 2006;  b. Myoview 10/08:  No ischemia, EF 72%.  c.  ETT 6/12:  Ex 9:00, no ischemic changes;  d. LHC  05/19/12: oLM 20%, LAD 40-50%, pD1 50%, mCFX 30-40%, mRCA stent with severe ISR and 70% beyond the stent, dRCA 50%, pPDA 50%, pPLA 30-40%.  EF 55-65%. =>  PCI:  Overlapping Promus DES x2 to the mid RCA ISR.  b. [place] s/p DES to LCx  c.95% circ DES 10/16,  . DM2 (diabetes mellitus, type 2) (HCC)   . GERD (gastroesophageal reflux disease)   . HLD (hyperlipidemia)   . Obesity     Social History   Social History  . Marital status: Married    Spouse name: N/A  . Number of children: N/A  . Years of education: N/A   Occupational History  . Not on file.   Social History Main Topics  . Smoking status: Never Smoker  . Smokeless tobacco: Never Used  . Alcohol use Yes     Comment: rare  . Drug use: No  . Sexual activity: Not on  file   Other Topics Concern  . Not on file   Social History Narrative  . No narrative on file     Family History  Problem Relation Age of Onset  . Diabetes Mother   . Coronary artery disease Father 83  . Heart attack Father      Review of Systems: General: negative for chills, fever, night sweats or weight changes.  Cardiovascular: negative for chest pain, dyspnea on exertion, edema, orthopnea, palpitations, paroxysmal nocturnal dyspnea or shortness of breath Dermatological: negative for rash Respiratory: negative for cough or wheezing Urologic: negative for hematuria Abdominal: negative for nausea, vomiting, diarrhea, bright red blood per rectum, melena, or hematemesis Neurologic: negative for visual changes, syncope, or dizziness All other systems reviewed and are otherwise negative except as noted above.    Blood pressure 98/64,  pulse 72, height  (1.676 m), weight 173 lb (78.5 kg).  General appearance: alert, cooperative and no distress Neck: no carotid bruit and no JVD Lungs: clear to auscultation bilaterally Heart: regular rate and rhythm Abdomen: soft, non-tender; bowel sounds normal; no masses,  no organomegaly Extremities: extremities normal, atraumatic, no cyanosis or edema Skin: Skin color, texture, turgor normal. No rashes or lesions Neurologic: Grossly normal  EKG NSR  ASSESSMENT AND PLAN:   Autonomic orthostatic hypotension I think Mr Wain has orthostatic hypotension secondary to his 20 lb wgt loss, diet change, and autonomic dysfunction from IDDM.  CAD (coronary artery disease)  2006 COSTAR study DES Stent to RCA ;   2014 cath: - severe ISR and 70% beyond the stent, dRCA, =>  PCI:  Overlapping Promus DES x2 to the mid RCA ISR.   Oct 2016 s/p DES to LCx- RCA stents patent  No angina, walks 5 mi a day  Insulin dependent diabetes mellitus (HCC) Type 2 IDDM, no h/o nephropathy or retinopathy  Dyslipidemia LDL 105 2016, followed by PCP. Lipids checked 6 months ago  Carotid artery disease (HCC) Bilateral asymptomatic carotid disease- 40-59% RICA, <40% LICA March 2017   PLAN  Instructed to stay hydrated and given orthostatic hypotension precautions. I will check a CBC and BMP and I suggested he have his carotid dopplers done.   Corine Shelter PA-C 12/23/2016 9:58 AM

## 2016-12-23 NOTE — Assessment & Plan Note (Signed)
Bilateral asymptomatic carotid disease- 40-59% RICA, <40% LICA March 2017

## 2016-12-23 NOTE — Assessment & Plan Note (Signed)
2006 COSTAR study DES Stent to RCA ;   2014 cath: - severe ISR and 70% beyond the stent, dRCA, =>  PCI:  Overlapping Promus DES x2 to the mid RCA ISR.   Oct 2016 s/p DES to LCx- RCA stents patent  No angina, walks 5 mi a day

## 2016-12-23 NOTE — Assessment & Plan Note (Signed)
LDL 105 2016, followed by PCP. Lipids checked 6 months ago

## 2016-12-23 NOTE — Assessment & Plan Note (Signed)
Type 2 IDDM, no h/o nephropathy or retinopathy

## 2017-01-15 ENCOUNTER — Encounter (HOSPITAL_COMMUNITY): Payer: BLUE CROSS/BLUE SHIELD

## 2017-01-27 DIAGNOSIS — E782 Mixed hyperlipidemia: Secondary | ICD-10-CM | POA: Diagnosis not present

## 2017-01-27 DIAGNOSIS — E1165 Type 2 diabetes mellitus with hyperglycemia: Secondary | ICD-10-CM | POA: Diagnosis not present

## 2017-01-27 DIAGNOSIS — I2510 Atherosclerotic heart disease of native coronary artery without angina pectoris: Secondary | ICD-10-CM | POA: Diagnosis not present

## 2017-01-27 DIAGNOSIS — M25512 Pain in left shoulder: Secondary | ICD-10-CM | POA: Diagnosis not present

## 2017-02-19 ENCOUNTER — Ambulatory Visit
Admission: RE | Admit: 2017-02-19 | Discharge: 2017-02-19 | Disposition: A | Discharge status: Home / Self Care | Payer: BLUE CROSS/BLUE SHIELD | Source: Ambulatory Visit | Attending: Family Medicine | Admitting: Family Medicine

## 2017-02-19 ENCOUNTER — Other Ambulatory Visit: Payer: Self-pay | Admitting: Family Medicine

## 2017-02-19 DIAGNOSIS — M25512 Pain in left shoulder: Secondary | ICD-10-CM

## 2017-02-19 DIAGNOSIS — S4992XA Unspecified injury of left shoulder and upper arm, initial encounter: Secondary | ICD-10-CM | POA: Diagnosis not present

## 2017-03-05 ENCOUNTER — Other Ambulatory Visit: Payer: Self-pay | Admitting: Family Medicine

## 2017-03-05 DIAGNOSIS — G8929 Other chronic pain: Secondary | ICD-10-CM

## 2017-03-05 DIAGNOSIS — M25512 Pain in left shoulder: Secondary | ICD-10-CM

## 2017-03-19 DIAGNOSIS — M7502 Adhesive capsulitis of left shoulder: Secondary | ICD-10-CM | POA: Diagnosis not present

## 2017-03-19 DIAGNOSIS — M25512 Pain in left shoulder: Secondary | ICD-10-CM | POA: Diagnosis not present

## 2017-03-31 DIAGNOSIS — M25512 Pain in left shoulder: Secondary | ICD-10-CM | POA: Diagnosis not present

## 2017-03-31 DIAGNOSIS — M7502 Adhesive capsulitis of left shoulder: Secondary | ICD-10-CM | POA: Diagnosis not present

## 2017-04-09 DIAGNOSIS — M25512 Pain in left shoulder: Secondary | ICD-10-CM | POA: Diagnosis not present

## 2017-04-09 DIAGNOSIS — M7502 Adhesive capsulitis of left shoulder: Secondary | ICD-10-CM | POA: Diagnosis not present

## 2017-04-16 DIAGNOSIS — M25512 Pain in left shoulder: Secondary | ICD-10-CM | POA: Diagnosis not present

## 2017-04-16 DIAGNOSIS — M7502 Adhesive capsulitis of left shoulder: Secondary | ICD-10-CM | POA: Diagnosis not present

## 2017-04-23 DIAGNOSIS — M25512 Pain in left shoulder: Secondary | ICD-10-CM | POA: Diagnosis not present

## 2017-04-23 DIAGNOSIS — M7502 Adhesive capsulitis of left shoulder: Secondary | ICD-10-CM | POA: Diagnosis not present

## 2017-04-30 DIAGNOSIS — M25512 Pain in left shoulder: Secondary | ICD-10-CM | POA: Diagnosis not present

## 2017-04-30 DIAGNOSIS — M7502 Adhesive capsulitis of left shoulder: Secondary | ICD-10-CM | POA: Diagnosis not present

## 2017-05-05 DIAGNOSIS — E663 Overweight: Secondary | ICD-10-CM | POA: Diagnosis not present

## 2017-05-05 DIAGNOSIS — M7502 Adhesive capsulitis of left shoulder: Secondary | ICD-10-CM | POA: Diagnosis not present

## 2017-05-05 DIAGNOSIS — I2510 Atherosclerotic heart disease of native coronary artery without angina pectoris: Secondary | ICD-10-CM | POA: Diagnosis not present

## 2017-05-05 DIAGNOSIS — E1165 Type 2 diabetes mellitus with hyperglycemia: Secondary | ICD-10-CM | POA: Diagnosis not present

## 2017-05-05 DIAGNOSIS — M25512 Pain in left shoulder: Secondary | ICD-10-CM | POA: Diagnosis not present

## 2017-05-05 DIAGNOSIS — E781 Pure hyperglyceridemia: Secondary | ICD-10-CM | POA: Diagnosis not present

## 2017-05-07 DIAGNOSIS — M25512 Pain in left shoulder: Secondary | ICD-10-CM | POA: Diagnosis not present

## 2017-05-07 DIAGNOSIS — M7502 Adhesive capsulitis of left shoulder: Secondary | ICD-10-CM | POA: Diagnosis not present

## 2017-05-14 DIAGNOSIS — M25512 Pain in left shoulder: Secondary | ICD-10-CM | POA: Diagnosis not present

## 2017-05-14 DIAGNOSIS — M7502 Adhesive capsulitis of left shoulder: Secondary | ICD-10-CM | POA: Diagnosis not present

## 2017-05-19 DIAGNOSIS — M25512 Pain in left shoulder: Secondary | ICD-10-CM | POA: Diagnosis not present

## 2017-05-19 DIAGNOSIS — M7502 Adhesive capsulitis of left shoulder: Secondary | ICD-10-CM | POA: Diagnosis not present

## 2017-05-21 DIAGNOSIS — M7502 Adhesive capsulitis of left shoulder: Secondary | ICD-10-CM | POA: Diagnosis not present

## 2017-05-21 DIAGNOSIS — M25512 Pain in left shoulder: Secondary | ICD-10-CM | POA: Diagnosis not present

## 2017-05-28 DIAGNOSIS — M7502 Adhesive capsulitis of left shoulder: Secondary | ICD-10-CM | POA: Diagnosis not present

## 2017-05-28 DIAGNOSIS — M25512 Pain in left shoulder: Secondary | ICD-10-CM | POA: Diagnosis not present

## 2017-06-04 DIAGNOSIS — M25512 Pain in left shoulder: Secondary | ICD-10-CM | POA: Diagnosis not present

## 2017-06-04 DIAGNOSIS — M7502 Adhesive capsulitis of left shoulder: Secondary | ICD-10-CM | POA: Diagnosis not present

## 2017-06-16 DIAGNOSIS — M25512 Pain in left shoulder: Secondary | ICD-10-CM | POA: Diagnosis not present

## 2017-06-16 DIAGNOSIS — M7502 Adhesive capsulitis of left shoulder: Secondary | ICD-10-CM | POA: Diagnosis not present

## 2017-08-10 ENCOUNTER — Encounter: Payer: Self-pay | Admitting: Cardiology

## 2017-08-14 ENCOUNTER — Telehealth: Payer: Self-pay | Admitting: Cardiology

## 2017-08-14 NOTE — Telephone Encounter (Signed)
Informed pt of Dr. Jenene Slicker recommendation. Appointment scheduled for 08/18/17 at 3 pm with Corine Shelter, PA

## 2017-08-14 NOTE — Telephone Encounter (Signed)
Pt states that he has been experiencing left sided CP that's described as pressure for the past couple of weeks. He reports it usually occur in the morning when get out his car to walk up the hill to his office. He states that he thought it was related to coffee and have since decreased his intake. Symptoms has decreased some. Pt states the symptoms mimic when he had to have a stent placed. Pt denies any active CP at the moment. Yearly appointment was moved up to 08/25/17 at 1020. Pt also advised to report the ED if CP reoccur. ]  Routing to MD for further recommendation.

## 2017-08-14 NOTE — Telephone Encounter (Signed)
New message    Pt c/o of Chest Pain: STAT if CP now or developed within 24 hours  1. Are you having CP right now? no  2. Are you experiencing any other symptoms (ex. SOB, nausea, vomiting, sweating)? no  3. How long have you been experiencing CP? For a couple weeks  4. Is your CP continuous or coming and going? Coming and going  5. Have you taken Nitroglycerin? No   Pt states he has been experiencing chest pain on exerction. He moved his yearly appt up to June 3rd. ?

## 2017-08-14 NOTE — Telephone Encounter (Signed)
I would suggest that his appt be moved up to an APP appt early next week and that he go to the ED with any recurrent pain.

## 2017-08-15 ENCOUNTER — Emergency Department (HOSPITAL_COMMUNITY): Payer: BLUE CROSS/BLUE SHIELD

## 2017-08-15 ENCOUNTER — Inpatient Hospital Stay (HOSPITAL_COMMUNITY)
Admission: EM | Admit: 2017-08-15 | Discharge: 2017-08-19 | DRG: 247 | Disposition: A | Discharge status: Home / Self Care | Payer: BLUE CROSS/BLUE SHIELD | Attending: Internal Medicine | Admitting: Internal Medicine

## 2017-08-15 ENCOUNTER — Encounter (HOSPITAL_COMMUNITY): Payer: Self-pay | Admitting: Oncology

## 2017-08-15 DIAGNOSIS — Z8249 Family history of ischemic heart disease and other diseases of the circulatory system: Secondary | ICD-10-CM

## 2017-08-15 DIAGNOSIS — I10 Essential (primary) hypertension: Secondary | ICD-10-CM | POA: Diagnosis not present

## 2017-08-15 DIAGNOSIS — Z7902 Long term (current) use of antithrombotics/antiplatelets: Secondary | ICD-10-CM | POA: Diagnosis not present

## 2017-08-15 DIAGNOSIS — K219 Gastro-esophageal reflux disease without esophagitis: Secondary | ICD-10-CM | POA: Diagnosis not present

## 2017-08-15 DIAGNOSIS — Z7982 Long term (current) use of aspirin: Secondary | ICD-10-CM

## 2017-08-15 DIAGNOSIS — Z955 Presence of coronary angioplasty implant and graft: Secondary | ICD-10-CM | POA: Diagnosis not present

## 2017-08-15 DIAGNOSIS — I200 Unstable angina: Secondary | ICD-10-CM | POA: Diagnosis not present

## 2017-08-15 DIAGNOSIS — Z888 Allergy status to other drugs, medicaments and biological substances status: Secondary | ICD-10-CM | POA: Diagnosis not present

## 2017-08-15 DIAGNOSIS — E1165 Type 2 diabetes mellitus with hyperglycemia: Secondary | ICD-10-CM | POA: Diagnosis not present

## 2017-08-15 DIAGNOSIS — Z794 Long term (current) use of insulin: Secondary | ICD-10-CM

## 2017-08-15 DIAGNOSIS — I25110 Atherosclerotic heart disease of native coronary artery with unstable angina pectoris: Secondary | ICD-10-CM | POA: Diagnosis not present

## 2017-08-15 DIAGNOSIS — Z833 Family history of diabetes mellitus: Secondary | ICD-10-CM

## 2017-08-15 DIAGNOSIS — E118 Type 2 diabetes mellitus with unspecified complications: Secondary | ICD-10-CM | POA: Diagnosis not present

## 2017-08-15 DIAGNOSIS — Z9861 Coronary angioplasty status: Secondary | ICD-10-CM | POA: Diagnosis not present

## 2017-08-15 DIAGNOSIS — Z79899 Other long term (current) drug therapy: Secondary | ICD-10-CM

## 2017-08-15 DIAGNOSIS — E785 Hyperlipidemia, unspecified: Secondary | ICD-10-CM | POA: Diagnosis not present

## 2017-08-15 DIAGNOSIS — T82855A Stenosis of coronary artery stent, initial encounter: Principal | ICD-10-CM | POA: Diagnosis present

## 2017-08-15 DIAGNOSIS — E669 Obesity, unspecified: Secondary | ICD-10-CM | POA: Diagnosis present

## 2017-08-15 DIAGNOSIS — Y831 Surgical operation with implant of artificial internal device as the cause of abnormal reaction of the patient, or of later complication, without mention of misadventure at the time of the procedure: Secondary | ICD-10-CM | POA: Diagnosis present

## 2017-08-15 DIAGNOSIS — Z880 Allergy status to penicillin: Secondary | ICD-10-CM

## 2017-08-15 DIAGNOSIS — R0789 Other chest pain: Secondary | ICD-10-CM | POA: Diagnosis present

## 2017-08-15 DIAGNOSIS — Z6828 Body mass index (BMI) 28.0-28.9, adult: Secondary | ICD-10-CM | POA: Diagnosis not present

## 2017-08-15 DIAGNOSIS — R079 Chest pain, unspecified: Secondary | ICD-10-CM | POA: Diagnosis not present

## 2017-08-15 DIAGNOSIS — E781 Pure hyperglyceridemia: Secondary | ICD-10-CM | POA: Diagnosis not present

## 2017-08-15 DIAGNOSIS — I2510 Atherosclerotic heart disease of native coronary artery without angina pectoris: Secondary | ICD-10-CM

## 2017-08-15 DIAGNOSIS — E782 Mixed hyperlipidemia: Secondary | ICD-10-CM | POA: Diagnosis not present

## 2017-08-15 HISTORY — DX: Atherosclerotic heart disease of native coronary artery without angina pectoris: I25.10

## 2017-08-15 LAB — CBC
HEMATOCRIT: 35.4 % — AB (ref 39.0–52.0)
Hemoglobin: 12.6 g/dL — ABNORMAL LOW (ref 13.0–17.0)
MCH: 31.3 pg (ref 26.0–34.0)
MCHC: 35.6 g/dL (ref 30.0–36.0)
MCV: 87.8 fL (ref 78.0–100.0)
Platelets: 204 10*3/uL (ref 150–400)
RBC: 4.03 MIL/uL — AB (ref 4.22–5.81)
RDW: 12 % (ref 11.5–15.5)
WBC: 8.9 10*3/uL (ref 4.0–10.5)

## 2017-08-15 LAB — BASIC METABOLIC PANEL
Anion gap: 5 (ref 5–15)
BUN: 19 mg/dL (ref 6–20)
CHLORIDE: 105 mmol/L (ref 101–111)
CO2: 26 mmol/L (ref 22–32)
Calcium: 8.7 mg/dL — ABNORMAL LOW (ref 8.9–10.3)
Creatinine, Ser: 0.75 mg/dL (ref 0.61–1.24)
GFR calc Af Amer: >60 mL/min (ref >60)
GFR calc non Af Amer: >60 mL/min (ref >60)
Glucose, Bld: 175 mg/dL — ABNORMAL HIGH (ref 65–99)
POTASSIUM: 3.8 mmol/L (ref 3.5–5.1)
Sodium: 136 mmol/L (ref 135–145)

## 2017-08-15 LAB — I-STAT TROPONIN, ED: Troponin i, poc: 0.01 ng/mL (ref 0.00–0.08)

## 2017-08-15 NOTE — ED Triage Notes (Signed)
Pt bib [facility] from home d/t CP.  Pt reports going to bed when he developed left sided CP w/ radiation to left jaw.  Pt took 324 mg asa and one nitro sublingual tablet prior to EMS arrival w/ resolution of pain.

## 2017-08-16 ENCOUNTER — Other Ambulatory Visit: Payer: Self-pay

## 2017-08-16 DIAGNOSIS — Z880 Allergy status to penicillin: Secondary | ICD-10-CM | POA: Diagnosis not present

## 2017-08-16 DIAGNOSIS — Z955 Presence of coronary angioplasty implant and graft: Secondary | ICD-10-CM | POA: Diagnosis not present

## 2017-08-16 DIAGNOSIS — K219 Gastro-esophageal reflux disease without esophagitis: Secondary | ICD-10-CM | POA: Diagnosis present

## 2017-08-16 DIAGNOSIS — E1165 Type 2 diabetes mellitus with hyperglycemia: Secondary | ICD-10-CM | POA: Diagnosis not present

## 2017-08-16 DIAGNOSIS — Z794 Long term (current) use of insulin: Secondary | ICD-10-CM | POA: Diagnosis not present

## 2017-08-16 DIAGNOSIS — Z888 Allergy status to other drugs, medicaments and biological substances status: Secondary | ICD-10-CM | POA: Diagnosis not present

## 2017-08-16 DIAGNOSIS — E118 Type 2 diabetes mellitus with unspecified complications: Secondary | ICD-10-CM | POA: Diagnosis not present

## 2017-08-16 DIAGNOSIS — Z7982 Long term (current) use of aspirin: Secondary | ICD-10-CM | POA: Diagnosis not present

## 2017-08-16 DIAGNOSIS — Z833 Family history of diabetes mellitus: Secondary | ICD-10-CM | POA: Diagnosis not present

## 2017-08-16 DIAGNOSIS — R0789 Other chest pain: Secondary | ICD-10-CM | POA: Diagnosis present

## 2017-08-16 DIAGNOSIS — R079 Chest pain, unspecified: Secondary | ICD-10-CM | POA: Diagnosis not present

## 2017-08-16 DIAGNOSIS — I2510 Atherosclerotic heart disease of native coronary artery without angina pectoris: Secondary | ICD-10-CM | POA: Diagnosis not present

## 2017-08-16 DIAGNOSIS — T82855A Stenosis of coronary artery stent, initial encounter: Secondary | ICD-10-CM | POA: Diagnosis not present

## 2017-08-16 DIAGNOSIS — E669 Obesity, unspecified: Secondary | ICD-10-CM | POA: Diagnosis present

## 2017-08-16 DIAGNOSIS — Z8249 Family history of ischemic heart disease and other diseases of the circulatory system: Secondary | ICD-10-CM | POA: Diagnosis not present

## 2017-08-16 DIAGNOSIS — I10 Essential (primary) hypertension: Secondary | ICD-10-CM | POA: Diagnosis not present

## 2017-08-16 DIAGNOSIS — Z6828 Body mass index (BMI) 28.0-28.9, adult: Secondary | ICD-10-CM | POA: Diagnosis not present

## 2017-08-16 DIAGNOSIS — E781 Pure hyperglyceridemia: Secondary | ICD-10-CM | POA: Diagnosis present

## 2017-08-16 DIAGNOSIS — Z9861 Coronary angioplasty status: Secondary | ICD-10-CM | POA: Diagnosis not present

## 2017-08-16 DIAGNOSIS — E785 Hyperlipidemia, unspecified: Secondary | ICD-10-CM | POA: Diagnosis not present

## 2017-08-16 DIAGNOSIS — Y831 Surgical operation with implant of artificial internal device as the cause of abnormal reaction of the patient, or of later complication, without mention of misadventure at the time of the procedure: Secondary | ICD-10-CM | POA: Diagnosis present

## 2017-08-16 DIAGNOSIS — E782 Mixed hyperlipidemia: Secondary | ICD-10-CM | POA: Diagnosis not present

## 2017-08-16 DIAGNOSIS — I200 Unstable angina: Secondary | ICD-10-CM | POA: Diagnosis not present

## 2017-08-16 DIAGNOSIS — Z7902 Long term (current) use of antithrombotics/antiplatelets: Secondary | ICD-10-CM | POA: Diagnosis not present

## 2017-08-16 DIAGNOSIS — I25110 Atherosclerotic heart disease of native coronary artery with unstable angina pectoris: Secondary | ICD-10-CM

## 2017-08-16 DIAGNOSIS — Z79899 Other long term (current) drug therapy: Secondary | ICD-10-CM | POA: Diagnosis not present

## 2017-08-16 LAB — BASIC METABOLIC PANEL
ANION GAP: 9 (ref 5–15)
BUN: 12 mg/dL (ref 6–20)
CO2: 23 mmol/L (ref 22–32)
Calcium: 8.6 mg/dL — ABNORMAL LOW (ref 8.9–10.3)
Chloride: 106 mmol/L (ref 101–111)
Creatinine, Ser: 0.61 mg/dL (ref 0.61–1.24)
GFR calc non Af Amer: >60 mL/min (ref >60)
GLUCOSE: 213 mg/dL — AB (ref 65–99)
Potassium: 4.3 mmol/L (ref 3.5–5.1)
Sodium: 138 mmol/L (ref 135–145)

## 2017-08-16 LAB — TROPONIN I
Troponin I: <0.03 ng/mL (ref <0.03)
Troponin I: <0.03 ng/mL (ref <0.03)
Troponin I: <0.03 ng/mL (ref <0.03)

## 2017-08-16 LAB — LIPID PANEL
CHOL/HDL RATIO: 7.4 ratio
CHOLESTEROL: 163 mg/dL (ref 0–200)
HDL: 22 mg/dL — ABNORMAL LOW (ref >40)
LDL Cholesterol: UNDETERMINED mg/dL (ref 0–99)
Triglycerides: 619 mg/dL — ABNORMAL HIGH (ref <150)
VLDL: UNDETERMINED mg/dL (ref 0–40)

## 2017-08-16 LAB — GLUCOSE, CAPILLARY
GLUCOSE-CAPILLARY: 293 mg/dL — AB (ref 65–99)
GLUCOSE-CAPILLARY: 246 mg/dL — AB (ref 65–99)
Glucose-Capillary: 285 mg/dL — ABNORMAL HIGH (ref 65–99)

## 2017-08-16 LAB — HEPARIN LEVEL (UNFRACTIONATED)
HEPARIN UNFRACTIONATED: 0.28 [IU]/mL — AB (ref 0.30–0.70)
Heparin Unfractionated: 0.19 IU/mL — ABNORMAL LOW (ref 0.30–0.70)

## 2017-08-16 LAB — HEMOGLOBIN A1C
Hgb A1c MFr Bld: 8.2 % — ABNORMAL HIGH (ref 4.8–5.6)
MEAN PLASMA GLUCOSE: 188.64 mg/dL

## 2017-08-16 LAB — CBG MONITORING, ED: Glucose-Capillary: 234 mg/dL — ABNORMAL HIGH (ref 65–99)

## 2017-08-16 LAB — HIV ANTIBODY (ROUTINE TESTING W REFLEX): HIV Screen 4th Generation wRfx: NONREACTIVE

## 2017-08-16 MED ORDER — ACETAMINOPHEN 325 MG PO TABS
650.0000 mg | ORAL_TABLET | ORAL | Status: DC | PRN
  Administered 2017-08-17: 650 mg via ORAL
  Filled 2017-08-16: qty 2

## 2017-08-16 MED ORDER — CLOPIDOGREL BISULFATE 75 MG PO TABS
75.0000 mg | ORAL_TABLET | Freq: Every day | ORAL | Status: DC
  Administered 2017-08-16 – 2017-08-19 (×4): 75 mg via ORAL
  Filled 2017-08-16 (×4): qty 1

## 2017-08-16 MED ORDER — HEPARIN BOLUS VIA INFUSION
4000.0000 [IU] | Freq: Once | INTRAVENOUS | Status: AC
  Administered 2017-08-16: 4000 [IU] via INTRAVENOUS
  Filled 2017-08-16: qty 4000

## 2017-08-16 MED ORDER — INSULIN GLARGINE 100 UNIT/ML ~~LOC~~ SOLN
10.0000 [IU] | Freq: Every day | SUBCUTANEOUS | Status: DC
  Administered 2017-08-16 – 2017-08-17 (×2): 10 [IU] via SUBCUTANEOUS
  Filled 2017-08-16 (×3): qty 0.1

## 2017-08-16 MED ORDER — ASPIRIN EC 81 MG PO TBEC
81.0000 mg | DELAYED_RELEASE_TABLET | Freq: Every day | ORAL | Status: DC
  Administered 2017-08-16 – 2017-08-19 (×4): 81 mg via ORAL
  Filled 2017-08-16 (×4): qty 1

## 2017-08-16 MED ORDER — NITROGLYCERIN 0.4 MG SL SUBL: 0.4000 mg | SUBLINGUAL_TABLET | SUBLINGUAL | Status: DC | PRN

## 2017-08-16 MED ORDER — ATORVASTATIN CALCIUM 40 MG PO TABS
40.0000 mg | ORAL_TABLET | Freq: Every day | ORAL | Status: DC
  Administered 2017-08-16 – 2017-08-18 (×3): 40 mg via ORAL
  Filled 2017-08-16 (×4): qty 1

## 2017-08-16 MED ORDER — HEPARIN (PORCINE) IN NACL 100-0.45 UNIT/ML-% IJ SOLN
1350.0000 [IU]/h | INTRAMUSCULAR | Status: DC
  Administered 2017-08-16: 950 [IU]/h via INTRAVENOUS
  Administered 2017-08-17: 1500 [IU]/h via INTRAVENOUS
  Administered 2017-08-17: 1300 [IU]/h via INTRAVENOUS
  Filled 2017-08-16 (×4): qty 250

## 2017-08-16 MED ORDER — ONDANSETRON HCL 4 MG/2ML IJ SOLN: 4.0000 mg | Freq: Four times a day (QID) | INTRAMUSCULAR | Status: DC | PRN

## 2017-08-16 MED ORDER — INSULIN ASPART 100 UNIT/ML ~~LOC~~ SOLN
0.0000 [IU] | Freq: Three times a day (TID) | SUBCUTANEOUS | Status: DC
  Administered 2017-08-16 – 2017-08-17 (×3): 8 [IU] via SUBCUTANEOUS
  Administered 2017-08-17: 5 [IU] via SUBCUTANEOUS
  Administered 2017-08-17: 8 [IU] via SUBCUTANEOUS
  Administered 2017-08-18: 18:00:00 11 [IU] via SUBCUTANEOUS
  Administered 2017-08-18 (×2): 8 [IU] via SUBCUTANEOUS
  Administered 2017-08-18: 11 [IU] via SUBCUTANEOUS
  Administered 2017-08-19: 07:00:00 5 [IU] via SUBCUTANEOUS

## 2017-08-16 NOTE — Progress Notes (Signed)
ANTICOAGULATION CONSULT NOTE - Follow Up Consult  Pharmacy Consult for Heparin Indication: chest pain/ACS  Allergies  Allergen Reactions  . Rosuvastatin     Other reaction(s): MUSCLE PAIN  . Vascepa [Epa Ethyl Ester] Other (See Comments)    Caused chest pain  . Ampicillin Nausea And Vomiting    Has patient had a PCN reaction causing immediate rash, facial/tongue/throat swelling, SOB or lightheadedness with hypotension: No Has patient had a PCN reaction causing severe rash involving mucus membranes or skin necrosis: No Has patient had a PCN reaction that required hospitalization: No Has patient had a PCN reaction occurring within the last 10 years: No If all of the above answers are "NO", then may proceed with Cephalosporin use.   Digestive reaction at age 52  . Metformin Diarrhea  . Penicillins Nausea And Vomiting    Digestive reaction    Patient Measurements: Height:  (167.6 cm) Weight: 172 lb (78 kg) IBW/kg (Calculated) : 63.8 Heparin Dosing Weight: 78 kg  Vital Signs: BP: 105/67 (05/26 1030) Pulse Rate: 73 (05/26 1030)  Labs: Recent Labs    08/15/17 2241 08/16/17 0320 08/16/17 0844  HGB 12.6*  --   --   HCT 35.4*  --   --   PLT 204  --   --   HEPARINUNFRC  --   --  0.19*  CREATININE 0.75  --  0.61  TROPONINI  --  <0.03 <0.03    Estimated Creatinine Clearance: 107.4 mL/min (by C-G formula based on SCr of 0.61 mg/dL).  Assessment: CC/HPI: Progressive CP  PMH: CAD with multiple PCI, DM2, HLD, GERD, obesity, Bilateral asymptomatic carotid disease-  Significant events: he has not been taking his simvastatin due to cramping. He also reports some cramping with his glargine and has not been taking this medication.  Anticoag: ACS with known CAD. HL 0.19. No new CBC  Goal of Therapy:  Heparin level 0.3-0.7 units/ml Monitor platelets by anticoagulation protocol: Yes   Plan:  F/u A1C Retry statin Increase IV heparin to 1150 units/hr Recheck heparin  level in 6 hrs Daily HL and CBC  Danny [name], PharmD, BCPS Clinical Staff Pharmacist Pager [phone]  Danny [name] 08/16/2017,11:16 AM

## 2017-08-16 NOTE — ED Notes (Signed)
Attempted to call report, RN to call back.

## 2017-08-16 NOTE — ED Provider Notes (Signed)
[facility] EMERGENCY DEPARTMENT Provider Note   CSN: [id number] Arrival date & time: 08/15/17  2225     History   Chief Complaint Chief Complaint  Patient presents with  . Chest Pain    HPI Danny [name] is a 52 y.o. male.  HPI 52 year old male with a known history of coronary artery disease who follows with Dr. Antoine Poche presents the emergency department with anterior chest pressure with radiation to the neck resolved after aspirin and nitroglycerin tonight.  He has had increasing exertional chest pain over the past 3 weeks.  He was scheduled to see cardiology on Tuesday but when he awoke this evening with anterior chest discomfort and radiation this concerned him and thus he came to the ER for evaluation.  He is concerned about his increasing chest discomfort which is similar to his prior chest discomfort which resulted in heart cath and stent.  No fevers or chills.  No shortness breath.  No nausea or vomiting.  No other complaints.  Symptoms were moderate in severity and have since resolved.  Currently 0 out of 10 pain.   Past Medical History:  Diagnosis Date  . CAD (coronary artery disease)    a. COSTAR study DES Stent to RCA 2006;  b. Myoview 10/08:  No ischemia, EF 72%.  c.  ETT 6/12:  Ex 9:00, no ischemic changes;  d. LHC  05/19/12: oLM 20%, LAD 40-50%, pD1 50%, mCFX 30-40%, mRCA stent with severe ISR and 70% beyond the stent, dRCA 50%, pPDA 50%, pPLA 30-40%.  EF 55-65%. =>  PCI:  Overlapping Promus DES x2 to the mid RCA ISR.  b. [place] s/p DES to LCx  c.95% circ DES 10/16,  . DM2 (diabetes mellitus, type 2) (HCC)   . GERD (gastroesophageal reflux disease)   . HLD (hyperlipidemia)   . Obesity     Patient Active Problem List   Diagnosis Date Noted  . Autonomic orthostatic hypotension 12/23/2016  . Carotid artery disease (HCC) 12/23/2016  . CAD (coronary artery disease)   . Unstable angina (HCC) 01/05/2015  . Dyslipidemia   . Hypertriglyceridemia   .  Insulin dependent diabetes mellitus (HCC)     Past Surgical History:  Procedure Laterality Date  . CARDIAC CATHETERIZATION N/A 01/05/2015   Procedure: Left Heart Cath and Coronary Angiography;  Surgeon: Peter M [place], MD;  Location: [facility] INVASIVE CV LAB;  Service: Cardiovascular;  Laterality: N/A;  . CARDIAC CATHETERIZATION  01/05/2015   Procedure: Coronary Stent Intervention;  Surgeon: Peter M [place], MD;  Location: [facility] INVASIVE CV LAB;  Service: Cardiovascular;;  . CORONARY ANGIOPLASTY WITH STENT PLACEMENT  05/19/2012   DES   to RCA   by Dr Excell Seltzer  . CORONARY STENT PLACEMENT  01/05/2015   mid cx  des  . PERCUTANEOUS CORONARY STENT INTERVENTION (PCI-S) N/A 05/19/2012   Procedure: PERCUTANEOUS CORONARY STENT INTERVENTION (PCI-S);  Surgeon: Tonny Bollman, MD;  Location: [facility] CATH LAB;  Service: Cardiovascular;  Laterality: N/A;        Home Medications    Prior to Admission medications   Medication Sig Start Date End Date Taking? Authorizing Provider  aspirin EC 81 MG EC tablet Take 1 tablet (81 mg total) by mouth daily. 01/06/15   Janetta Hora, PA-C  clopidogrel (PLAVIX) 75 MG tablet TAKE 1 TABLET BY MOUTH EVERY DAY 07/07/16   [name], MD  insulin aspart (NOVOLOG) 100 UNIT/ML injection Inject 1-10 Units into the skin 3 (three) times daily before meals. Per carb  counting    [provider]  insulin glargine (LANTUS) 100 UNIT/ML injection Inject 25 Units into the skin 2 (two) times daily.    [provider]  Liraglutide (VICTOZA [place]) Inject 0.6 mg into the skin daily. 01/01/16 12/31/16  [provider]  NITROSTAT 0.4 MG SL tablet PLACE 1 TABLET (0.4 MG TOTAL) UNDER THE TONGUE EVERY 5 (FIVE) MINUTES AS NEEDED FOR CHEST PAIN.    [name], MD  simvastatin (ZOCOR) 20 MG tablet TAKE 1 TABLET BY MOUTH EVERY DAY AT 6 PM 01/25/16   Janetta Hora, PA-C    Family History Family History  Problem Relation Age of Onset  . Diabetes Mother   .  Coronary artery disease Father 57  . Heart attack Father     Social History Social History   Tobacco Use  . Smoking status: Never Smoker  . Smokeless tobacco: Never Used  Substance Use Topics  . Alcohol use: Yes    Comment: rare  . Drug use: No     Allergies   Ampicillin and Penicillins   Review of Systems Review of Systems  All other systems reviewed and are negative.    Physical Exam Updated Vital Signs BP 108/69   Pulse 72   Temp 98.5 F (36.9 C) (Oral)   Resp 13   Ht  (1.676 m)   Wt 78 kg (172 lb)   SpO2 96%   BMI 27.76 kg/m   Physical Exam  Constitutional: He is oriented to person, place, and time. He appears well-developed and well-nourished.  HENT:  Head: Normocephalic and atraumatic.  Eyes: EOM are normal.  Neck: Normal range of motion.  Cardiovascular: Normal rate, regular rhythm, normal heart sounds and intact distal pulses.  Pulmonary/Chest: Effort normal and breath sounds normal. No respiratory distress.  Abdominal: Soft. He exhibits no distension. There is no tenderness.  Musculoskeletal: Normal range of motion.  Neurological: He is alert and oriented to person, place, and time.  Skin: Skin is warm and dry.  Psychiatric: He has a normal mood and affect. Judgment normal.  Nursing note and vitals reviewed.    ED Treatments / Results  Labs (all labs ordered are listed, but only abnormal results are displayed) Labs Reviewed  BASIC METABOLIC PANEL - Abnormal; Notable for the following components:      Result Value   Glucose, Bld 175 (*)    Calcium 8.7 (*)    All other components within normal limits  CBC - Abnormal; Notable for the following components:   RBC 4.03 (*)    Hemoglobin 12.6 (*)    HCT 35.4 (*)    All other components within normal limits  I-STAT TROPONIN, ED    EKG EKG Interpretation  Date/Time:  Saturday Aug 15 2017 22:28:19 EDT Ventricular Rate:  74 PR Interval:    QRS Duration: 96 QT Interval:  399 QTC  Calculation: 443 R Axis:   -20 Text Interpretation:  Normal sinus rhythm Borderline left axis deviation No significant change was found Confirmed by Azalia Bilis ([id number]) on 08/15/2017 11:11:26 PM   Radiology Dg Chest 2 View  Result Date: 08/15/2017 CLINICAL DATA:  Left-sided chest pain radiating to the left jaw. History of diabetes and heart disease. Nonsmoker. EXAM: CHEST - 2 VIEW COMPARISON:  01/05/2015 FINDINGS: Normal heart size and pulmonary vascularity. No focal airspace disease or consolidation in the lungs. No blunting of costophrenic angles. No pneumothorax. Mediastinal contours appear intact. Coronary artery stent. Degenerative changes in the spine and  shoulders. IMPRESSION: No active cardiopulmonary disease. Electronically Signed   By: Burman Nieves M.D.   On: 08/15/2017 23:10    Procedures .Critical Care Performed by: Azalia Bilis, MD Authorized by: Azalia Bilis, MD    CRITICAL CARE Performed by: Azalia Bilis Total critical care time: 33 minutes Critical care time was exclusive of separately billable procedures and treating other patients. Critical care was necessary to treat or prevent imminent or life-threatening deterioration. Critical care was time spent personally by me on the following activities: development of treatment plan with patient and/or surrogate as well as nursing, discussions with consultants, evaluation of patient's response to treatment, examination of patient, obtaining history from patient or surrogate, ordering and performing treatments and interventions, ordering and review of laboratory studies, ordering and review of radiographic studies, pulse oximetry and re-evaluation of patient's condition.   Medications Ordered in ED Medications - No data to display   Initial Impression / Assessment and Plan / ED Course  I have reviewed the triage vital signs and the nursing notes.  Pertinent labs & imaging results that were available during my care of  the patient were reviewed by me and considered in my medical decision making (see chart for details).     Patient story is highly concerning for unstable angina given anterior chest pain with radiation to the neck and is been increasing in severity.  No active pain at this time.  Initial EKG without ischemic changes.  Troponin negative.  Resolution of symptoms prior to evaluation in the emergency department after aspirin and nitroglycerin.  Patient will be initiated on heparin drip.  Patient will need admission to the cardiology service and likely repeat heart catheterization.  Prior heart cath in 2016 personally reviewed results.  Consultations: Cardiology (Dr. Enid Skeens)  Dispo: Admit  Final Clinical Impressions(s) / ED Diagnoses   Final diagnoses:  Unstable angina [facility])    ED Discharge Orders    None       Azalia Bilis, MD 08/16/17 [phone]

## 2017-08-16 NOTE — Progress Notes (Signed)
ANTICOAGULATION CONSULT NOTE - Initial Consult  Pharmacy Consult for heparin Indication: chest pain/ACS  Allergies  Allergen Reactions  . Rosuvastatin     Other reaction(s): MUSCLE PAIN  . Vascepa [Epa Ethyl Ester] Other (See Comments)    Caused chest pain  . Ampicillin Nausea And Vomiting    Has patient had a PCN reaction causing immediate rash, facial/tongue/throat swelling, SOB or lightheadedness with hypotension: No Has patient had a PCN reaction causing severe rash involving mucus membranes or skin necrosis: No Has patient had a PCN reaction that required hospitalization: No Has patient had a PCN reaction occurring within the last 10 years: No If all of the above answers are "NO", then may proceed with Cephalosporin use.   Digestive reaction at age 52  . Metformin Diarrhea  . Penicillins Nausea And Vomiting    Digestive reaction    Patient Measurements: Height:  (167.6 cm) Weight: 172 lb (78 kg) IBW/kg (Calculated) : 63.8 Heparin Dosing Weight: 78 kg  Vital Signs: Temp: 98.5 F (36.9 C) (05/25 2227) Temp Source: Oral (05/25 2227) BP: 108/69 (05/25 2330) Pulse Rate: 72 (05/25 2330)  Labs: Recent Labs    08/15/17 2241  HGB 12.6*  HCT 35.4*  PLT 204  CREATININE 0.75    Estimated Creatinine Clearance: 107.4 mL/min (by C-G formula based on SCr of 0.75 mg/dL).   Medical History: Past Medical History:  Diagnosis Date  . CAD (coronary artery disease)    a. COSTAR study DES Stent to RCA 2006;  b. Myoview 10/08:  No ischemia, EF 72%.  c.  ETT 6/12:  Ex 9:00, no ischemic changes;  d. LHC  05/19/12: oLM 20%, LAD 40-50%, pD1 50%, mCFX 30-40%, mRCA stent with severe ISR and 70% beyond the stent, dRCA 50%, pPDA 50%, pPLA 30-40%.  EF 55-65%. =>  PCI:  Overlapping Promus DES x2 to the mid RCA ISR.  b. [place] s/p DES to LCx  c.95% circ DES 10/16,  . DM2 (diabetes mellitus, type 2) (HCC)   . GERD (gastroesophageal reflux disease)   . HLD (hyperlipidemia)   . Obesity      Assessment: 6 yoM with a known history of coronary artery disease, presented with anterior chest pressure with radiation to the neck resolved after aspirin and nitroglycerin tonight. He has had increasing exertional chest pain over the past 3 weeks. Pharmacy consulted to start heparin. No AC PTA.   Goal of Therapy:  Heparin level 0.3-0.7 units/ml Monitor platelets by anticoagulation protocol: Yes   Plan:  Give 4000 units bolus x 1 Start heparin infusion at 950 units/hr Check anti-Xa level in 6 hours and daily while on heparin Continue to monitor H&H and platelets   Thank you for allowing [place] to participate in this patients care.  Signe Colt, PharmD Main pharmacy at: [phone] 08/16/2017 12:38 AM

## 2017-08-16 NOTE — Progress Notes (Signed)
Subjective:  No recurrent chest pain overnight.  Has had multiple prior stents.  Currently on heparin.  Troponins and EKG are unremarkable.   Objective:  Vital Signs in the last 24 hours: BP 105/67   Pulse 73   Temp 98.5 F (36.9 C) (Oral)   Resp 14   Ht  (1.676 m)   Wt 78 kg (172 lb)   SpO2 97%   BMI 27.76 kg/m   Physical Exam: Pleasant male in no acute distress sinus rhythm Lungs:  Clear Cardiac:  Regular rhythm, normal S1 and S2, no S3 Extremities:  No edema present  Intake/Output from previous day: No intake/output data recorded.  Weight Filed Weights   08/15/17 2234  Weight: 78 kg (172 lb)    Lab Results: Basic Metabolic Panel: Recent Labs    08/15/17 2241 08/16/17 0844  NA 136 138  K 3.8 4.3  CL 105 106  CO2 26 23  GLUCOSE 175* 213*  BUN 19 12  CREATININE 0.75 0.61   CBC: Recent Labs    08/15/17 2241  WBC 8.9  HGB 12.6*  HCT 35.4*  MCV 87.8  PLT 204   Cardiac Enzymes: Troponin (Point of Care Test) Recent Labs    08/15/17 2307  TROPIPOC 0.01   Cardiac Panel (last 3 results) Recent Labs    08/16/17 0320 08/16/17 0844  TROPONINI <0.03 <0.03    Telemetry: Sinus rhythm  Assessment/Plan:  1.  Unstable angina pectoris 2.  Multiple prior PCI  Recommendations:  Currently on IV heparin.  He will need to have a repeat catheterization and discussed this with the patient we will go ahead and let him eat today.     Darden Palmer  MD [facility]  08/16/2017, 11:00 AM  No recurrent chest pain overnight

## 2017-08-16 NOTE — Progress Notes (Signed)
Patient admitted from 5C-6. He is AAO x4, no apparent distress noted. He denies any chest pain/discomfort. On Heparin IV at 1150 units/hr. Orientation to safety done and patient verbalized understanding.

## 2017-08-16 NOTE — H&P (Signed)
Cardiology Admission History and Physical:   Patient ID: Danny [name]; MRN: [id number]; DOB: 1965/07/02   Admission date: 08/15/2017  Primary Care Provider: Hoyt Koch, MD Primary Cardiologist: Hochrein  Chief Complaint:  Chest pain  Patient Profile:   Danny [name] is a 52 y.o. male with a history of CAD with multiple PCI, DM2, HLD, GERD, obesity who presents with progressive chest pain.   History of Present Illness:   Danny [name] is a 52 y.o. male with a history of CAD with multiple PCI, DM2, HLD, GERD, obesity who presents with progressive chest pain.   The patient has a history of significant CAD with multiple prior PCI, most recently with DES to the LCx in 2016. He follows with Dr. Antoine Poche and was last seen in cardiology clinic in October 2018. At that time he had some orthostasis but no chest pain.   Over the past several weeks, he has had progressive chest discomfort. He describes the pain to be substernal chest pressure that would occur when he walked up the hill to his office every morning. This pain would generally subside with rest, and he was able to walk normally in the afternoon. However, yesterday he noticed pain when exerting himself throughout the day. He states that his symptoms are consistent with his prior angina. He took NTG with improvement of his symtpoms. He has had some mild dyspnea and fatigue but denies other symptoms.    Of note, on mediation review, he reports that he has not been taking his simvastatin due to cramping. He also reports some cramping with his glargine and has not been taking this medication.  In the ED, pt free of chest pain or other complaints. ECG showed NSR with no acute ischemic changes. Troponin was negative x1. Due to concern for UA, he was started on heparin gtt.   Past Medical History:  Diagnosis Date  . CAD (coronary artery disease)    a. COSTAR study DES Stent to RCA 2006;  b. Myoview 10/08:  No ischemia, EF 72%.  c.  ETT  6/12:  Ex 9:00, no ischemic changes;  d. LHC  05/19/12: oLM 20%, LAD 40-50%, pD1 50%, mCFX 30-40%, mRCA stent with severe ISR and 70% beyond the stent, dRCA 50%, pPDA 50%, pPLA 30-40%.  EF 55-65%. =>  PCI:  Overlapping Promus DES x2 to the mid RCA ISR.  b. [place] s/p DES to LCx  c.95% circ DES 10/16,  . DM2 (diabetes mellitus, type 2) (HCC)   . GERD (gastroesophageal reflux disease)   . HLD (hyperlipidemia)   . Obesity     Past Surgical History:  Procedure Laterality Date  . CARDIAC CATHETERIZATION N/A 01/05/2015   Procedure: Left Heart Cath and Coronary Angiography;  Surgeon: Peter M Swaziland, MD;  Location: [facility] INVASIVE CV LAB;  Service: Cardiovascular;  Laterality: N/A;  . CARDIAC CATHETERIZATION  01/05/2015   Procedure: Coronary Stent Intervention;  Surgeon: Peter M Swaziland, MD;  Location: [facility] INVASIVE CV LAB;  Service: Cardiovascular;;  . CORONARY ANGIOPLASTY WITH STENT PLACEMENT  05/19/2012   DES   to RCA   by Dr Excell Seltzer  . CORONARY STENT PLACEMENT  01/05/2015   mid cx  des  . PERCUTANEOUS CORONARY STENT INTERVENTION (PCI-S) N/A 05/19/2012   Procedure: PERCUTANEOUS CORONARY STENT INTERVENTION (PCI-S);  Surgeon: Tonny Bollman, MD;  Location: [facility] CATH LAB;  Service: Cardiovascular;  Laterality: N/A;     Medications Prior to Admission: Prior to Admission medications   Medication Sig Start  Date End Date Taking? Authorizing Provider  aspirin EC 81 MG EC tablet Take 1 tablet (81 mg total) by mouth daily. 01/06/15  Yes Janetta Hora, PA-C  clopidogrel (PLAVIX) 75 MG tablet TAKE 1 TABLET BY MOUTH EVERY DAY 07/07/16  Yes [name], MD  insulin aspart (NOVOLOG) 100 UNIT/ML injection Inject 5-15 Units into the skin 3 (three) times daily before meals. Per carb counting   Yes [provider]  insulin glargine (LANTUS) 100 UNIT/ML injection Inject 25 Units into the skin 2 (two) times daily.   Yes [provider]  NITROSTAT 0.4 MG SL tablet PLACE 1 TABLET (0.4 MG TOTAL) UNDER THE  TONGUE EVERY 5 (FIVE) MINUTES AS NEEDED FOR CHEST PAIN.   Yes [name], MD  simvastatin (ZOCOR) 20 MG tablet TAKE 1 TABLET BY MOUTH EVERY DAY AT 6 PM Patient not taking: Reported on 08/16/2017 01/25/16   Janetta Hora, PA-C     Allergies:    Allergies  Allergen Reactions  . Rosuvastatin     Other reaction(s): MUSCLE PAIN  . Vascepa [Epa Ethyl Ester] Other (See Comments)    Caused chest pain  . Ampicillin Nausea And Vomiting    Has patient had a PCN reaction causing immediate rash, facial/tongue/throat swelling, SOB or lightheadedness with hypotension: No Has patient had a PCN reaction causing severe rash involving mucus membranes or skin necrosis: No Has patient had a PCN reaction that required hospitalization: No Has patient had a PCN reaction occurring within the last 10 years: No If all of the above answers are "NO", then may proceed with Cephalosporin use.   Digestive reaction at age 64  . Metformin Diarrhea  . Penicillins Nausea And Vomiting    Digestive reaction    Social History:   Social History   Socioeconomic History  . Marital status: Married    Spouse name: Not on file  . Number of children: Not on file  . Years of education: Not on file  . Highest education level: Not on file  Occupational History  . Not on file  Social Needs  . Financial resource strain: Not on file  . Food insecurity:    Worry: Not on file    Inability: Not on file  . Transportation needs:    Medical: Not on file    Non-medical: Not on file  Tobacco Use  . Smoking status: Never Smoker  . Smokeless tobacco: Never Used  Substance and Sexual Activity  . Alcohol use: Yes    Comment: rare  . Drug use: No  . Sexual activity: Not on file  Lifestyle  . Physical activity:    Days per week: Not on file    Minutes per session: Not on file  . Stress: Not on file  Relationships  . Social connections:    Talks on phone: Not on file    Gets together: Not on file    Attends  religious service: Not on file    Active member of club or organization: Not on file    Attends meetings of clubs or organizations: Not on file    Relationship status: Not on file  . Intimate partner violence:    Fear of current or ex partner: Not on file    Emotionally abused: Not on file    Physically abused: Not on file    Forced sexual activity: Not on file  Other Topics Concern  . Not on file  Social History Narrative  . Not on file  Family History:    The patient's family history includes Coronary artery disease (age of onset: 49) in his father; Diabetes in his mother; Heart attack in his father.    ROS:  Please see the history of present illness.  All other ROS reviewed and negative.     Physical Exam/Data:   Vitals:   08/16/17 0030 08/16/17 0100 08/16/17 0200 08/16/17 0230  BP: 106/74 106/72 105/83 (!) 97/59  Pulse: 71 73 71 65  Resp: Temp:      TempSrc:      SpO2: 99% 96% 100% 98%  Weight:      Height:       No intake or output data in the 24 hours ending 08/16/17 0251 Filed Weights   08/15/17 2234  Weight: 78 kg (172 lb)   Body mass index is 27.76 kg/m.  General:  Well nourished, well developed, in no acute distress HEENT: normal Lymph: no adenopathy Neck: no JVD Cardiac:  normal S1, S2; RRR; no murmur   Lungs:  clear to auscultation bilaterally, no wheezing, rhonchi or rales  Abd: soft, nontender, no hepatomegaly  Ext: no LE edema Musculoskeletal:  No deformities, BUE and BLE strength normal and equal Skin: warm and dry  Neuro:  No focal abnormalities noted Psych:  Normal affect    EKG:  The ECG that was done and was personally reviewed and demonstrates NSR with no acute ischemic changes  Relevant CV Studies:  LHC 12/2014:  Prox RCA to Mid RCA lesion, 30% stenosed.  Prox RCA lesion, 40% stenosed. The lesion was previously treated with a drug-eluting stentgreater than two years ago.  Dist RCA-1 lesion, 50% stenosed.  Dist  RCA-2 lesion, 30% stenosed.  Prox LAD to Mid LAD lesion, 25% stenosed.  1st Diag lesion, 25% stenosed.  Prox Cx to Mid Cx lesion, 30% stenosed.  The left ventricular systolic function is normal.  Mid Cx lesion, 95% stenosed. Post intervention, there is a 0% residual stenosis.   1. Severe single vessel obstructive CAD 2. Continued patency of the RCA stents  3. Normal LV function 4. Successful stenting of the mid LCx with a DES.      Laboratory Data:  Chemistry Recent Labs  Lab 08/15/17 2241  NA 136  K 3.8  CL 105  CO2 26  GLUCOSE 175*  BUN 19  CREATININE 0.75  CALCIUM 8.7*  GFRNONAA >60  GFRAA >60  ANIONGAP 5    No results for input(s): PROT, ALBUMIN, AST, ALT, ALKPHOS, BILITOT in the last 168 hours. Hematology Recent Labs  Lab 08/15/17 2241  WBC 8.9  RBC 4.03*  HGB 12.6*  HCT 35.4*  MCV 87.8  MCH 31.3  MCHC 35.6  RDW 12.0  PLT 204   Cardiac EnzymesNo results for input(s): TROPONINI in the last 168 hours.  Recent Labs  Lab 08/15/17 2307  TROPIPOC 0.01    BNPNo results for input(s): BNP, PROBNP in the last 168 hours.  DDimer No results for input(s): DDIMER in the last 168 hours.  Radiology/Studies:  Dg Chest 2 View  Result Date: 08/15/2017 CLINICAL DATA:  Left-sided chest pain radiating to the left jaw. History of diabetes and heart disease. Nonsmoker. EXAM: CHEST - 2 VIEW COMPARISON:  01/05/2015 FINDINGS: Normal heart size and pulmonary vascularity. No focal airspace disease or consolidation in the lungs. No blunting of costophrenic angles. No pneumothorax. Mediastinal contours appear intact. Coronary artery stent. Degenerative changes in the spine and shoulders. IMPRESSION: No active cardiopulmonary  disease. Electronically Signed   By: Burman Nieves M.D.   On: 08/15/2017 23:10    Assessment and Plan:   Chest pain CAD with multiple prior PCI The patient has a history of significant CAD with prior PCI to RCA and LCx, last in 2016. He presents  with progressive chest pain similar to his prior angina. He is currently chest pain free, and ECG and troponin are reassuring against acute MI. Heparin gtt was started in the ED due to concern for UA.  -Continue to monitor symptoms and trend troponin -Reasonable to continue heparin gtt -Continue home ASA, clopidogrel -Re-challenging with atorvastatin. Discussed this with patient. -BB discontinued previously due to intolerance. May benefit from repeat challenge. -Lipid panel, HgA1c ordered -Repeat echocardiogram ordered as no recent study in system is visible -NPO for possible ischemic evaluation today.   Insulin dependent diabetes mellitus The patient reports incomplete adherence to his insulin regimen. He has no HgA1c in system since 2016. -Restarting reduced dose of glargine as pt is NPO (discussed with patient). SSI added -HgA1c ordered  Dyslipidemia As above, not currently taking statin. -Lipid panel ordered -Re-challenging with atorvastatin. Discussed this with patient.  Carotid artery disease (HCC) Bilateral asymptomatic carotid disease- 40-59% RICA, <40% LICA March 2017   Severity of Illness: The appropriate patient status for this patient is INPATIENT. Inpatient status is judged to be reasonable and necessary in order to provide the required intensity of service to ensure the patient's safety. The patient's presenting symptoms, physical exam findings, and initial radiographic and laboratory data in the context of their chronic comorbidities is felt to place them at high risk for further clinical deterioration. Furthermore, it is not anticipated that the patient will be medically stable for discharge from the hospital within 2 midnights of admission. The following factors support the patient status of inpatient.   " The patient's presenting symptoms include chest pain. " The worrisome physical exam findings include n/a. " The initial radiographic and laboratory data are  worrisome because of possible ACS. " The chronic co-morbidities include DM, HTN.   * I certify that at the point of admission it is my clinical judgment that the patient will require inpatient hospital care spanning beyond 2 midnights from the point of admission due to high intensity of service, high risk for further deterioration and high frequency of surveillance required.*    For questions or updates, please contact [facility] Please consult [email or web address] for contact info under Cardiology/STEMI.    Signed, Ernest Mallick, MD  08/16/2017 2:51 AM

## 2017-08-16 NOTE — Progress Notes (Signed)
ANTICOAGULATION CONSULT NOTE  Pharmacy Consult for Heparin Indication: chest pain/ACS  Patient Measurements: Height:  (167.6 cm) Weight: 172 lb (78 kg) IBW/kg (Calculated) : 63.8 Heparin Dosing Weight: 78 kg  Vital Signs: Temp: 98.1 F (36.7 C) (05/26 1405) Temp Source: Oral (05/26 1405) BP: 117/76 (05/26 1405) Pulse Rate: 67 (05/26 1405)  Labs: Recent Labs    08/15/17 2241 08/16/17 0320 08/16/17 0844 08/16/17 1414 08/16/17 1834  HGB 12.6*  --   --   --   --   HCT 35.4*  --   --   --   --   PLT 204  --   --   --   --   HEPARINUNFRC  --   --  0.19*  --  0.28*  CREATININE 0.75  --  0.61  --   --   TROPONINI  --  <0.03 <0.03 <0.03  --     Assessment: Evening heparin level is subtherapeutic. SCr 0.6, cbc ok  Goal of Therapy:  Heparin level 0.3-0.7 units/ml Monitor platelets by anticoagulation protocol: Yes   Plan:  Increase heparin to 1300 units/hr Daily HL, CBC Confirmatory level in AM   Caidyn Henricksen, Darl Householder 08/16/2017,7:56 PM

## 2017-08-17 ENCOUNTER — Other Ambulatory Visit (HOSPITAL_COMMUNITY): Payer: BLUE CROSS/BLUE SHIELD

## 2017-08-17 ENCOUNTER — Inpatient Hospital Stay (HOSPITAL_COMMUNITY): Payer: BLUE CROSS/BLUE SHIELD

## 2017-08-17 DIAGNOSIS — R079 Chest pain, unspecified: Secondary | ICD-10-CM

## 2017-08-17 DIAGNOSIS — I200 Unstable angina: Secondary | ICD-10-CM

## 2017-08-17 LAB — ECHOCARDIOGRAM COMPLETE
HEIGHTINCHES: 66 in
WEIGHTICAEL: 2790.4 [oz_av]

## 2017-08-17 LAB — CBC
HCT: 39.5 % (ref 39.0–52.0)
Hemoglobin: 14.1 g/dL (ref 13.0–17.0)
MCH: 30.9 pg (ref 26.0–34.0)
MCHC: 35.7 g/dL (ref 30.0–36.0)
MCV: 86.4 fL (ref 78.0–100.0)
Platelets: 232 10*3/uL (ref 150–400)
RBC: 4.57 MIL/uL (ref 4.22–5.81)
RDW: 11.9 % (ref 11.5–15.5)
WBC: 8.9 10*3/uL (ref 4.0–10.5)

## 2017-08-17 LAB — HEPARIN LEVEL (UNFRACTIONATED)
Heparin Unfractionated: 0.29 IU/mL — ABNORMAL LOW (ref 0.30–0.70)
Heparin Unfractionated: 0.6 IU/mL (ref 0.30–0.70)

## 2017-08-17 LAB — GLUCOSE, CAPILLARY
GLUCOSE-CAPILLARY: 226 mg/dL — AB (ref 65–99)
Glucose-Capillary: 255 mg/dL — ABNORMAL HIGH (ref 65–99)
Glucose-Capillary: 262 mg/dL — ABNORMAL HIGH (ref 65–99)
Glucose-Capillary: 312 mg/dL — ABNORMAL HIGH (ref 65–99)

## 2017-08-17 NOTE — Progress Notes (Signed)
ANTICOAGULATION CONSULT NOTE  Pharmacy Consult for Heparin Indication: chest pain/ACS  Patient Measurements: Height:  (167.6 cm) Weight: 174 lb 6.4 oz (79.1 kg) IBW/kg (Calculated) : 63.8 Heparin Dosing Weight: 78 kg  Vital Signs: Temp: 98.4 F (36.9 C) (05/27 1336) Temp Source: Oral (05/27 1336) BP: 112/62 (05/27 1336) Pulse Rate: 72 (05/27 1336)  Labs: Recent Labs    08/15/17 2241 08/16/17 0320  08/16/17 0844 08/16/17 1414 08/16/17 1834 08/17/17 0306 08/17/17 1831  HGB 12.6*  --   --   --   --   --  14.1  --   HCT 35.4*  --   --   --   --   --  39.5  --   PLT 204  --   --   --   --   --  232  --   HEPARINUNFRC  --   --    < > 0.19*  --  0.28* 0.29* 0.60  CREATININE 0.75  --   --  0.61  --   --   --   --   TROPONINI  --  <0.03  --  <0.03 <0.03  --   --   --    < > = values in this interval not displayed.    Assessment: Patient with PMH of CAD admitted with rogressive CP. Heparin level this morning is still subtherapeutic at 0.29. RN confirmed no interruptions to therapy and no issues with IV lines. CBC stable, no overt bleeding reported.    Heparin level therapeutic: 0.60, awaiting cath tomorrow morning  Goal of Therapy:  Heparin level 0.3-0.7 units/ml Monitor platelets by anticoagulation protocol: Yes   Plan:  Continue heparin at 1500 units/hr Daily Heparin level and CBC   Ruben Im, PharmD Clinical Pharmacist 08/17/2017 7:43 PM

## 2017-08-17 NOTE — Progress Notes (Signed)
  Echocardiogram 2D Echocardiogram has been performed.  Danny [name] 08/17/2017, 3:31 PM

## 2017-08-17 NOTE — Progress Notes (Signed)
ANTICOAGULATION CONSULT NOTE  Pharmacy Consult for Heparin Indication: chest pain/ACS  Patient Measurements: Height:  (167.6 cm) Weight: 174 lb 6.4 oz (79.1 kg) IBW/kg (Calculated) : 63.8 Heparin Dosing Weight: 78 kg  Vital Signs: Temp: 98.6 F (37 C) (05/27 0424) Temp Source: Oral (05/27 0424) BP: 110/68 (05/27 0424) Pulse Rate: 75 (05/27 0424)  Labs: Recent Labs    08/15/17 2241 08/16/17 0320 08/16/17 0844 08/16/17 1414 08/16/17 1834 08/17/17 0306  HGB 12.6*  --   --   --   --  14.1  HCT 35.4*  --   --   --   --  39.5  PLT 204  --   --   --   --  232  HEPARINUNFRC  --   --  0.19*  --  0.28* 0.29*  CREATININE 0.75  --  0.61  --   --   --   TROPONINI  --  <0.03 <0.03 <0.03  --   --     Assessment: Patient with PMH of CAD admitted with rogressive CP. Heparin level this morning is still subtherapeutic at 0.29. RN confirmed no interruptions to therapy and no issues with IV lines. CBC stable, no overt bleeding reported.     Goal of Therapy:  Heparin level 0.3-0.7 units/ml Monitor platelets by anticoagulation protocol: Yes   Plan:  Increase heparin to 1500 units/hr Heparin level in 6 hours Daily Heparin level and CBC   Adline Potter, PharmD Pharmacy Resident Pager: [phone]

## 2017-08-17 NOTE — Progress Notes (Signed)
Progress Note  Patient Name: Danny [name] Date of Encounter: 08/17/2017  Primary Cardiologist:   No primary care provider on file.   Subjective   Mild chest pain overnight.  Pain free currently.  No SOB.   Inpatient Medications    Scheduled Meds: . aspirin EC  81 mg Oral Daily  . atorvastatin  40 mg Oral q1800  . clopidogrel  75 mg Oral Daily  . insulin aspart  0-15 Units Subcutaneous TID WC  . insulin glargine  10 Units Subcutaneous Daily   Continuous Infusions: . heparin 1,300 Units/hr (08/17/17 0058)   PRN Meds: acetaminophen, nitroGLYCERIN, ondansetron (ZOFRAN) IV   Vital Signs    Vitals:   08/16/17 1315 08/16/17 1405 08/16/17 2125 08/17/17 0424  BP: 118/73 117/76 118/69 110/68  Pulse: 68 67 71 75  Resp: (!) 21     Temp:  98.1 F (36.7 C) 98.3 F (36.8 C) 98.6 F (37 C)  TempSrc:  Oral Oral Oral  SpO2: 98% 100% 98% 98%  Weight:    174 lb 6.4 oz (79.1 kg)  Height:        Intake/Output Summary (Last 24 hours) at 08/17/2017 0753 Last data filed at 08/17/2017 0058 Gross per 24 hour  Intake 960.87 ml  Output 0 ml  Net 960.87 ml   Filed Weights   08/15/17 2234 08/17/17 0424  Weight: 172 lb (78 kg) 174 lb 6.4 oz (79.1 kg)    Telemetry    NSR - Personally Reviewed  ECG    NA - Personally Reviewed  Physical Exam   GEN: No acute distress.   Neck: No  JVD Cardiac: RRR, no murmurs, rubs, or gallops.  Respiratory: Clear  to auscultation bilaterally. GI: Soft, nontender, non-distended  MS: No  edema; No deformity. Neuro:  Nonfocal  Psych: Normal affect   Labs    Chemistry Recent Labs  Lab 08/15/17 2241 08/16/17 0844  NA 136 138  K 3.8 4.3  CL 105 106  CO2 26 23  GLUCOSE 175* 213*  BUN 19 12  CREATININE 0.75 0.61  CALCIUM 8.7* 8.6*  GFRNONAA >60 >60  GFRAA >60 >60  ANIONGAP 5 9     Hematology Recent Labs  Lab 08/15/17 2241 08/17/17 0306  WBC 8.9 8.9  RBC 4.03* 4.57  HGB 12.6* 14.1  HCT 35.4* 39.5  MCV 87.8 86.4  MCH 31.3  30.9  MCHC 35.6 35.7  RDW 12.0 11.9  PLT 204 232    Cardiac Enzymes Recent Labs  Lab 08/16/17 0320 08/16/17 0844 08/16/17 1414  TROPONINI <0.03 <0.03 <0.03    Recent Labs  Lab 08/15/17 2307  TROPIPOC 0.01     BNPNo results for input(s): BNP, PROBNP in the last 168 hours.   DDimer No results for input(s): DDIMER in the last 168 hours.   Radiology    Dg Chest 2 View  Result Date: 08/15/2017 CLINICAL DATA:  Left-sided chest pain radiating to the left jaw. History of diabetes and heart disease. Nonsmoker. EXAM: CHEST - 2 VIEW COMPARISON:  01/05/2015 FINDINGS: Normal heart size and pulmonary vascularity. No focal airspace disease or consolidation in the lungs. No blunting of costophrenic angles. No pneumothorax. Mediastinal contours appear intact. Coronary artery stent. Degenerative changes in the spine and shoulders. IMPRESSION: No active cardiopulmonary disease. Electronically Signed   By: Burman Nieves M.D.   On: 08/15/2017 23:10    Cardiac Studies   NA  Patient Profile     52 y.o. male male with a  history of CAD with multiple PCI, DM2, HLD, GERD, obesity who presents with progressive chest pain.    Assessment & Plan    CHEST PAIN:    Unstable angina.  Cath in AM.  On add on list   DM:  Continue current therapy.  A1C8.2  DYSLIPIDEMIA:   Agrees to restart Lipitor.   Triglycerides are very elevated.    For questions or updates, please contact [facility] Please consult [email or web address] for contact info under Cardiology/STEMI.   Signed, [name], MD  08/17/2017, 7:53 AM

## 2017-08-18 ENCOUNTER — Ambulatory Visit: Payer: BLUE CROSS/BLUE SHIELD | Admitting: Cardiology

## 2017-08-18 ENCOUNTER — Encounter (HOSPITAL_COMMUNITY): Payer: Self-pay | Admitting: Cardiology

## 2017-08-18 ENCOUNTER — Inpatient Hospital Stay (HOSPITAL_COMMUNITY)
Admission: EM | Disposition: A | Discharge status: Home / Self Care | Payer: Self-pay | Source: Home / Self Care | Attending: Internal Medicine

## 2017-08-18 DIAGNOSIS — I2510 Atherosclerotic heart disease of native coronary artery without angina pectoris: Secondary | ICD-10-CM

## 2017-08-18 DIAGNOSIS — Z9861 Coronary angioplasty status: Secondary | ICD-10-CM

## 2017-08-18 DIAGNOSIS — E782 Mixed hyperlipidemia: Secondary | ICD-10-CM

## 2017-08-18 HISTORY — PX: CORONARY STENT INTERVENTION: CATH118234

## 2017-08-18 HISTORY — PX: LEFT HEART CATH AND CORONARY ANGIOGRAPHY: CATH118249

## 2017-08-18 LAB — PROTIME-INR
INR: 1.1
PROTHROMBIN TIME: 14.1 s (ref 11.4–15.2)

## 2017-08-18 LAB — HEPARIN LEVEL (UNFRACTIONATED): Heparin Unfractionated: 0.88 IU/mL — ABNORMAL HIGH (ref 0.30–0.70)

## 2017-08-18 LAB — GLUCOSE, CAPILLARY
GLUCOSE-CAPILLARY: 315 mg/dL — AB (ref 65–99)
GLUCOSE-CAPILLARY: 171 mg/dL — AB (ref 65–99)
Glucose-Capillary: 283 mg/dL — ABNORMAL HIGH (ref 65–99)
Glucose-Capillary: 257 mg/dL — ABNORMAL HIGH (ref 65–99)

## 2017-08-18 LAB — CBC
HEMATOCRIT: 40 % (ref 39.0–52.0)
HEMOGLOBIN: 14.5 g/dL (ref 13.0–17.0)
MCH: 30.7 pg (ref 26.0–34.0)
MCHC: 36.3 g/dL — ABNORMAL HIGH (ref 30.0–36.0)
MCV: 84.7 fL (ref 78.0–100.0)
Platelets: 224 10*3/uL (ref 150–400)
RBC: 4.72 MIL/uL (ref 4.22–5.81)
RDW: 11.9 % (ref 11.5–15.5)
WBC: 8.8 10*3/uL (ref 4.0–10.5)

## 2017-08-18 LAB — POCT ACTIVATED CLOTTING TIME: Activated Clotting Time: 312 seconds

## 2017-08-18 SURGERY — LEFT HEART CATH AND CORONARY ANGIOGRAPHY
Anesthesia: LOCAL

## 2017-08-18 MED ORDER — LABETALOL HCL 5 MG/ML IV SOLN: 10.0000 mg | INTRAVENOUS | Status: AC | PRN

## 2017-08-18 MED ORDER — IOPAMIDOL (ISOVUE-370) INJECTION 76%
INTRAVENOUS | Status: AC
  Filled 2017-08-18: qty 100

## 2017-08-18 MED ORDER — FENTANYL CITRATE (PF) 100 MCG/2ML IJ SOLN
INTRAMUSCULAR | Status: AC
  Filled 2017-08-18: qty 2

## 2017-08-18 MED ORDER — SODIUM CHLORIDE 0.9 % WEIGHT BASED INFUSION
1.0000 mL/kg/h | INTRAVENOUS | Status: DC
  Administered 2017-08-18: 1 mL/kg/h via INTRAVENOUS

## 2017-08-18 MED ORDER — INSULIN GLARGINE 100 UNIT/ML ~~LOC~~ SOLN
15.0000 [IU] | Freq: Two times a day (BID) | SUBCUTANEOUS | Status: DC
  Administered 2017-08-18 – 2017-08-19 (×3): 15 [IU] via SUBCUTANEOUS
  Filled 2017-08-18 (×4): qty 0.15

## 2017-08-18 MED ORDER — SODIUM CHLORIDE 0.9% FLUSH
3.0000 mL | Freq: Two times a day (BID) | INTRAVENOUS | Status: DC
  Administered 2017-08-18: 3 mL via INTRAVENOUS

## 2017-08-18 MED ORDER — HEPARIN (PORCINE) IN NACL 2-0.9 UNITS/ML
INTRAMUSCULAR | Status: AC | PRN
  Administered 2017-08-18 (×2): 500 mL via INTRA_ARTERIAL

## 2017-08-18 MED ORDER — VERAPAMIL HCL 2.5 MG/ML IV SOLN
INTRAVENOUS | Status: DC | PRN
  Administered 2017-08-18: 10 mL via INTRA_ARTERIAL

## 2017-08-18 MED ORDER — SODIUM CHLORIDE 0.9 % IV SOLN
INTRAVENOUS | Status: AC
  Administered 2017-08-18: 14:00:00 via INTRAVENOUS

## 2017-08-18 MED ORDER — SODIUM CHLORIDE 0.9% FLUSH: 3.0000 mL | INTRAVENOUS | Status: DC | PRN

## 2017-08-18 MED ORDER — MIDAZOLAM HCL 2 MG/2ML IJ SOLN
INTRAMUSCULAR | Status: AC
  Filled 2017-08-18: qty 2

## 2017-08-18 MED ORDER — ANGIOPLASTY BOOK
Freq: Once | Status: DC
  Filled 2017-08-18: qty 1

## 2017-08-18 MED ORDER — SODIUM CHLORIDE 0.9 % IV SOLN: 250.0000 mL | INTRAVENOUS | Status: DC | PRN

## 2017-08-18 MED ORDER — HYDRALAZINE HCL 20 MG/ML IJ SOLN
5.0000 mg | INTRAMUSCULAR | Status: AC | PRN
Start: 2017-08-18 — End: 2017-08-18

## 2017-08-18 MED ORDER — SODIUM CHLORIDE 0.9 % WEIGHT BASED INFUSION: 3.0000 mL/kg/h | INTRAVENOUS | Status: DC

## 2017-08-18 MED ORDER — SODIUM CHLORIDE 0.9 % WEIGHT BASED INFUSION
3.0000 mL/kg/h | INTRAVENOUS | Status: DC
  Administered 2017-08-18: 3 mL/kg/h via INTRAVENOUS

## 2017-08-18 MED ORDER — HEPARIN SODIUM (PORCINE) 1000 UNIT/ML IJ SOLN
INTRAMUSCULAR | Status: DC | PRN
  Administered 2017-08-18: 4000 [IU] via INTRAVENOUS
  Administered 2017-08-18: 5000 [IU] via INTRAVENOUS

## 2017-08-18 MED ORDER — FENTANYL CITRATE (PF) 100 MCG/2ML IJ SOLN
INTRAMUSCULAR | Status: DC | PRN
  Administered 2017-08-18 (×3): 25 ug via INTRAVENOUS

## 2017-08-18 MED ORDER — LIDOCAINE HCL (PF) 1 % IJ SOLN
INTRAMUSCULAR | Status: AC
  Filled 2017-08-18: qty 30

## 2017-08-18 MED ORDER — VERAPAMIL HCL 2.5 MG/ML IV SOLN
INTRAVENOUS | Status: AC
  Filled 2017-08-18: qty 2

## 2017-08-18 MED ORDER — LIDOCAINE HCL (PF) 1 % IJ SOLN
INTRAMUSCULAR | Status: DC | PRN
  Administered 2017-08-18: 2 mL via INTRADERMAL

## 2017-08-18 MED ORDER — HEPARIN SODIUM (PORCINE) 1000 UNIT/ML IJ SOLN
INTRAMUSCULAR | Status: AC
  Filled 2017-08-18: qty 1

## 2017-08-18 MED ORDER — HEPARIN (PORCINE) IN NACL 1000-0.9 UT/500ML-% IV SOLN
INTRAVENOUS | Status: AC
  Filled 2017-08-18: qty 1000

## 2017-08-18 MED ORDER — MIDAZOLAM HCL 2 MG/2ML IJ SOLN
INTRAMUSCULAR | Status: DC | PRN
  Administered 2017-08-18 (×3): 1 mg via INTRAVENOUS

## 2017-08-18 MED ORDER — IOPAMIDOL (ISOVUE-370) INJECTION 76%
INTRAVENOUS | Status: DC | PRN
  Administered 2017-08-18: 125 mL via INTRA_ARTERIAL

## 2017-08-18 MED ORDER — METOPROLOL TARTRATE 12.5 MG HALF TABLET
12.5000 mg | ORAL_TABLET | Freq: Two times a day (BID) | ORAL | Status: DC
  Administered 2017-08-18 – 2017-08-19 (×3): 12.5 mg via ORAL
  Filled 2017-08-18 (×3): qty 1

## 2017-08-18 SURGICAL SUPPLY — 16 items
BALLN EMERGE MR 2.5X8 (BALLOONS) ×2
BALLOON EMERGE MR 2.5X8 (BALLOONS) ×1 IMPLANT
CATH INFINITI 5 FR JL3.5 (CATHETERS) ×2 IMPLANT
CATH OPTITORQUE TIG 4.0 5F (CATHETERS) ×2 IMPLANT
CATH VISTA GUIDE 6FR JR4 (CATHETERS) ×2 IMPLANT
DEVICE RAD COMP TR BAND LRG (VASCULAR PRODUCTS) ×2 IMPLANT
GLIDESHEATH SLEND A-KIT 6F 22G (SHEATH) ×2 IMPLANT
GUIDEWIRE INQWIRE 1.5J.035X260 (WIRE) ×1 IMPLANT
INQWIRE 1.5J .035X260CM (WIRE) ×2
KIT ENCORE 26 ADVANTAGE (KITS) ×2 IMPLANT
KIT HEART LEFT (KITS) ×2 IMPLANT
PACK CARDIAC CATHETERIZATION (CUSTOM PROCEDURE TRAY) ×2 IMPLANT
STENT SYNERGY DES 2.75X12 (Permanent Stent) ×2 IMPLANT
TRANSDUCER W/STOPCOCK (MISCELLANEOUS) ×2 IMPLANT
TUBING CIL FLEX 10 FLL-RA (TUBING) ×2 IMPLANT
WIRE ASAHI PROWATER 180CM (WIRE) ×2 IMPLANT

## 2017-08-18 NOTE — Interval H&P Note (Signed)
History and Physical Interval Note:  08/18/2017 11:45 AM  Danny [name]  has presented today for surgery, with the diagnosis of unstable angina(with known CAD-PCI)  The various methods of treatment have been discussed with the patient and family. After consideration of risks, benefits and other options for treatment, the patient has consented to  Procedure(s): LEFT HEART CATH AND CORONARY ANGIOGRAPHY (N/A) with possible PERCUTANEOUS CORONARY INTERVENTION as a surgical intervention .  The patient's history has been reviewed, patient examined, no change in status, stable for surgery.  I have reviewed the patient's chart and labs.  Questions were answered to the patient's satisfaction.    Cath Lab Visit (complete for each Cath Lab visit)  Clinical Evaluation Leading to the Procedure:   ACS: Yes.    Non-ACS:    Anginal Classification: CCS III  Anti-ischemic medical therapy: No Therapy  Non-Invasive Test Results: No non-invasive testing performed  Prior CABG: No previous CABG   Bryan Lemma

## 2017-08-18 NOTE — Progress Notes (Signed)
Patient leaving floor to cath lab. Significant other present at the time of transfer.Danny [name]  Ruthe Mannan, BSN, RN

## 2017-08-18 NOTE — H&P (View-Only) (Signed)
Progress Note  Patient Name: Danny [name] Date of Encounter: 08/18/2017  Primary Cardiologist: [name], MD   Subjective   No chest pain, some fullness but some due to his elevated glucose per pt.  No SOB.  Cath today.    Inpatient Medications    Scheduled Meds: . aspirin EC  81 mg Oral Daily  . atorvastatin  40 mg Oral q1800  . clopidogrel  75 mg Oral Daily  . insulin aspart  0-15 Units Subcutaneous TID WC  . insulin glargine  10 Units Subcutaneous Daily  . sodium chloride flush  3 mL Intravenous Q12H   Continuous Infusions: . sodium chloride    . [START ON 08/19/2017] sodium chloride 3 mL/kg/hr (08/18/17 0748)   Followed by  . [START ON 08/19/2017] sodium chloride    . heparin 1,500 Units/hr (08/17/17 2200)   PRN Meds: sodium chloride, acetaminophen, nitroGLYCERIN, ondansetron (ZOFRAN) IV, sodium chloride flush   Vital Signs    Vitals:   08/17/17 1336 08/17/17 2104 08/18/17 0455 08/18/17 0456  BP: 112/62 109/72  112/82  Pulse: 72 68  82  Resp: Temp: 98.4 F (36.9 C) 98.7 F (37.1 C)  97.7 F (36.5 C)  TempSrc: Oral Oral  Oral  SpO2: 99% 97%  97%  Weight:   173 lb 9.6 oz (78.7 kg)   Height:        Intake/Output Summary (Last 24 hours) at 08/18/2017 0819 Last data filed at 08/17/2017 1700 Gross per 24 hour  Intake 840 ml  Output -  Net 840 ml   Filed Weights   08/15/17 2234 08/17/17 0424 08/18/17 0455  Weight: 172 lb (78 kg) 174 lb 6.4 oz (79.1 kg) 173 lb 9.6 oz (78.7 kg)    Telemetry    SR - Personally Reviewed  ECG    No new - Personally Reviewed  Physical Exam   GEN: No acute distress.   Neck: No JVD Cardiac: RRR, no murmurs, rubs, or gallops.  Respiratory: Clear to auscultation bilaterally. GI: Soft, nontender, non-distended  MS: No edema; No deformity. Neuro:  Nonfocal  Psych: Normal affect   Labs    Chemistry Recent Labs  Lab 08/15/17 2241 08/16/17 0844  NA 136 138  K 3.8 4.3  CL 105 106  CO2 26 23    GLUCOSE 175* 213*  BUN 19 12  CREATININE 0.75 0.61  CALCIUM 8.7* 8.6*  GFRNONAA >60 >60  GFRAA >60 >60  ANIONGAP 5 9     Hematology Recent Labs  Lab 08/15/17 2241 08/17/17 0306 08/18/17 0706  WBC 8.9 8.9 8.8  RBC 4.03* 4.57 4.72  HGB 12.6* 14.1 14.5  HCT 35.4* 39.5 40.0  MCV 87.8 86.4 84.7  MCH 31.3 30.9 30.7  MCHC 35.6 35.7 36.3*  RDW 12.0 11.9 11.9  PLT 204 232 224    Cardiac Enzymes Recent Labs  Lab 08/16/17 0320 08/16/17 0844 08/16/17 1414  TROPONINI <0.03 <0.03 <0.03    Recent Labs  Lab 08/15/17 2307  TROPIPOC 0.01     BNPNo results for input(s): BNP, PROBNP in the last 168 hours.   DDimer No results for input(s): DDIMER in the last 168 hours.   Radiology    No results found.  Cardiac Studies   Echo: Study Conclusions  - Left ventricle: The cavity size was normal. Systolic function was   vigorous. The estimated ejection fraction was in the range of 65%   to 70%. Wall motion was normal; there  were no regional wall   motion abnormalities. Doppler parameters are consistent with   abnormal left ventricular relaxation (grade 1 diastolic   dysfunction). There was no evidence of elevated ventricular   filling pressure by Doppler parameters. - Aortic valve: Transvalvular velocity was minimally increased.   There was no stenosis. Valve area (VTI): 2.23 cm^2. Valve area   (Vmax): 2.06 cm^2. Valve area (Vmean): 2.22 cm^2. - Aortic root: The aortic root was normal in size. - Mitral valve: There was no regurgitation. - Right ventricle: Systolic function was normal. - Tricuspid valve: There was mild regurgitation. - Pulmonary arteries: Systolic pressure was within the normal   range. - Inferior vena cava: The vessel was normal in size. - Pericardium, extracardiac: There was no pericardial effusion.  Patient Profile     51 y.o. male with a history of CAD with multiple PCI, DM2, HLD, GERD, obesity admitted with chest pain.    Assessment & Plan     Unstable angina, for cath today pk troponin 0.88 -echo with normal EF  CAD with hx of PCIs  DM medical therapy glucose with elevated glucose lantus at home, now on 10 u daily and was on 25 u BID will increase to 15 u BID.  and ssi mod  HLD was off lipitor now resumed.    For questions or updates, please contact [facility] Please consult [email or web address] for contact info under Cardiology/STEMI.     Signed, Nada Boozer, NP  08/18/2017, 8:19 AM    The patient was seen, examined and discussed with Nada Boozer, NP and I agree with the above.   52 y.o. male with a history of CAD with multiple PCI, DM2, HLD, GERD, obesity admitted with chest pain.  He ruled out for ACS, ECG shows SR, nonspecific ST T wave abnormalities, unchanged from prior ECHG.  troponin negative x 3, started on heparin drip for UA, on ASA/plavix, atorvastatin, vitals stable, no more chest pain, cath today.  Tobias Alexander, MD 08/18/2017

## 2017-08-18 NOTE — Progress Notes (Signed)
ANTICOAGULATION CONSULT NOTE  Pharmacy Consult for Heparin Indication: chest pain/ACS  Patient Measurements: Height:  (167.6 cm) Weight: 173 lb 9.6 oz (78.7 kg) IBW/kg (Calculated) : 63.8 Heparin Dosing Weight: 78 kg  Vital Signs: Temp: 97.7 F (36.5 C) (05/28 0456) Temp Source: Oral (05/28 0456) BP: 112/82 (05/28 0456) Pulse Rate: 82 (05/28 0456)  Labs: Recent Labs    08/15/17 2241 08/16/17 0320 08/16/17 0844 08/16/17 1414  08/17/17 0306 08/17/17 1831 08/18/17 0706  HGB 12.6*  --   --   --   --  14.1  --  14.5  HCT 35.4*  --   --   --   --  39.5  --  40.0  PLT 204  --   --   --   --  232  --  224  LABPROT  --   --   --   --   --   --   --  14.1  INR  --   --   --   --   --   --   --  1.10  HEPARINUNFRC  --   --  0.19*  --    < > 0.29* 0.60 0.88*  CREATININE 0.75  --  0.61  --   --   --   --   --   TROPONINI  --  <0.03 <0.03 <0.03  --   --   --   --    < > = values in this interval not displayed.    Assessment: Patient with PMH of CAD admitted with progressive CP continuing on heparin. CBC wnl.  Heparin level now high at 0.88 this AM, awaiting cath 5/28. No bleed or infusion issues reported per discussion with RN.  Goal of Therapy:  Heparin level 0.3-0.7 units/ml Monitor platelets by anticoagulation protocol: Yes   Plan:  Reduce heparin to 1350 units/hr 6h heparin level vs. cath Monitor daily heparin level and CBC, s/sx bleeding Cath for 5/28  Babs Bertin, PharmD, BCPS Clinical Pharmacist Clinical phone for 08/18/2017 until 3:30pm: [phone] If after 3:30pm, please call main pharmacy at: [phone] 08/18/2017 8:42 AM

## 2017-08-18 NOTE — Progress Notes (Addendum)
 Progress Note  Patient Name: Danny [name] Date of Encounter: 08/18/2017  Primary Cardiologist: James Hochrein, MD   Subjective   No chest pain, some fullness but some due to his elevated glucose per pt.  No SOB.  Cath today.    Inpatient Medications    Scheduled Meds: . aspirin EC  81 mg Oral Daily  . atorvastatin  40 mg Oral q1800  . clopidogrel  75 mg Oral Daily  . insulin aspart  0-15 Units Subcutaneous TID WC  . insulin glargine  10 Units Subcutaneous Daily  . sodium chloride flush  3 mL Intravenous Q12H   Continuous Infusions: . sodium chloride    . [START ON 08/19/2017] sodium chloride 3 mL/kg/hr (08/18/17 0748)   Followed by  . [START ON 08/19/2017] sodium chloride    . heparin 1,500 Units/hr (08/17/17 2200)   PRN Meds: sodium chloride, acetaminophen, nitroGLYCERIN, ondansetron (ZOFRAN) IV, sodium chloride flush   Vital Signs    Vitals:   08/17/17 1336 08/17/17 2104 08/18/17 0455 08/18/17 0456  BP: 112/62 109/72  112/82  Pulse: 72 68  82  Resp: 19 18  18  Temp: 98.4 F (36.9 C) 98.7 F (37.1 C)  97.7 F (36.5 C)  TempSrc: Oral Oral  Oral  SpO2: 99% 97%  97%  Weight:   173 lb 9.6 oz (78.7 kg)   Height:        Intake/Output Summary (Last 24 hours) at 08/18/2017 0819 Last data filed at 08/17/2017 1700 Gross per 24 hour  Intake 840 ml  Output -  Net 840 ml   Filed Weights   08/15/17 2234 08/17/17 0424 08/18/17 0455  Weight: 172 lb (78 kg) 174 lb 6.4 oz (79.1 kg) 173 lb 9.6 oz (78.7 kg)    Telemetry    SR - Personally Reviewed  ECG    No new - Personally Reviewed  Physical Exam   GEN: No acute distress.   Neck: No JVD Cardiac: RRR, no murmurs, rubs, or gallops.  Respiratory: Clear to auscultation bilaterally. GI: Soft, nontender, non-distended  MS: No edema; No deformity. Neuro:  Nonfocal  Psych: Normal affect   Labs    Chemistry Recent Labs  Lab 08/15/17 2241 08/16/17 0844  NA 136 138  K 3.8 4.3  CL 105 106  CO2 26 23    GLUCOSE 175* 213*  BUN 19 12  CREATININE 0.75 0.61  CALCIUM 8.7* 8.6*  GFRNONAA >60 >60  GFRAA >60 >60  ANIONGAP 5 9     Hematology Recent Labs  Lab 08/15/17 2241 08/17/17 0306 08/18/17 0706  WBC 8.9 8.9 8.8  RBC 4.03* 4.57 4.72  HGB 12.6* 14.1 14.5  HCT 35.4* 39.5 40.0  MCV 87.8 86.4 84.7  MCH 31.3 30.9 30.7  MCHC 35.6 35.7 36.3*  RDW 12.0 11.9 11.9  PLT 204 232 224    Cardiac Enzymes Recent Labs  Lab 08/16/17 0320 08/16/17 0844 08/16/17 1414  TROPONINI <0.03 <0.03 <0.03    Recent Labs  Lab 08/15/17 2307  TROPIPOC 0.01     BNPNo results for input(s): BNP, PROBNP in the last 168 hours.   DDimer No results for input(s): DDIMER in the last 168 hours.   Radiology    No results found.  Cardiac Studies   Echo: Study Conclusions  - Left ventricle: The cavity size was normal. Systolic function was   vigorous. The estimated ejection fraction was in the range of 65%   to 70%. Wall motion was normal; there   were no regional wall   motion abnormalities. Doppler parameters are consistent with   abnormal left ventricular relaxation (grade 1 diastolic   dysfunction). There was no evidence of elevated ventricular   filling pressure by Doppler parameters. - Aortic valve: Transvalvular velocity was minimally increased.   There was no stenosis. Valve area (VTI): 2.23 cm^2. Valve area   (Vmax): 2.06 cm^2. Valve area (Vmean): 2.22 cm^2. - Aortic root: The aortic root was normal in size. - Mitral valve: There was no regurgitation. - Right ventricle: Systolic function was normal. - Tricuspid valve: There was mild regurgitation. - Pulmonary arteries: Systolic pressure was within the normal   range. - Inferior vena cava: The vessel was normal in size. - Pericardium, extracardiac: There was no pericardial effusion.  Patient Profile     51 y.o. male with a history of CAD with multiple PCI, DM2, HLD, GERD, obesity admitted with chest pain.    Assessment & Plan     Unstable angina, for cath today pk troponin 0.88 -echo with normal EF  CAD with hx of PCIs  DM medical therapy glucose with elevated glucose lantus at home, now on 10 u daily and was on 25 u BID will increase to 15 u BID.  and ssi mod  HLD was off lipitor now resumed.    For questions or updates, please contact [facility] Please consult [email or web address] for contact info under Cardiology/STEMI.     Signed, Nada Boozer, NP  08/18/2017, 8:19 AM    The patient was seen, examined and discussed with Nada Boozer, NP and I agree with the above.   52 y.o. male with a history of CAD with multiple PCI, DM2, HLD, GERD, obesity admitted with chest pain.  He ruled out for ACS, ECG shows SR, nonspecific ST T wave abnormalities, unchanged from prior ECHG.  troponin negative x 3, started on heparin drip for UA, on ASA/plavix, atorvastatin, vitals stable, no more chest pain, cath today.  Tobias Alexander, MD 08/18/2017

## 2017-08-18 NOTE — Progress Notes (Signed)
TR BAND REMOVAL  LOCATION:    right radial  DEFLATED PER PROTOCOL:    Yes.    TIME BAND OFF / DRESSING APPLIED:    1630   SITE UPON ARRIVAL:    Level 0  SITE AFTER BAND REMOVAL:    Level 0  CIRCULATION SENSATION AND MOVEMENT:    Within Normal Limits   Yes.    COMMENTS:    

## 2017-08-19 ENCOUNTER — Encounter (HOSPITAL_COMMUNITY): Payer: Self-pay | Admitting: Cardiology

## 2017-08-19 DIAGNOSIS — I2510 Atherosclerotic heart disease of native coronary artery without angina pectoris: Secondary | ICD-10-CM

## 2017-08-19 DIAGNOSIS — I25110 Atherosclerotic heart disease of native coronary artery with unstable angina pectoris: Secondary | ICD-10-CM | POA: Diagnosis present

## 2017-08-19 HISTORY — DX: Atherosclerotic heart disease of native coronary artery without angina pectoris: I25.10

## 2017-08-19 LAB — CBC
HCT: 38.3 % — ABNORMAL LOW (ref 39.0–52.0)
Hemoglobin: 14 g/dL (ref 13.0–17.0)
MCH: 31.5 pg (ref 26.0–34.0)
MCHC: 36.6 g/dL — AB (ref 30.0–36.0)
MCV: 86.1 fL (ref 78.0–100.0)
PLATELETS: 225 10*3/uL (ref 150–400)
RBC: 4.45 MIL/uL (ref 4.22–5.81)
RDW: 12 % (ref 11.5–15.5)
WBC: 10.7 10*3/uL — ABNORMAL HIGH (ref 4.0–10.5)

## 2017-08-19 LAB — BASIC METABOLIC PANEL
Anion gap: 7 (ref 5–15)
BUN: 13 mg/dL (ref 6–20)
CALCIUM: 9 mg/dL (ref 8.9–10.3)
CO2: 29 mmol/L (ref 22–32)
CREATININE: 0.85 mg/dL (ref 0.61–1.24)
Chloride: 100 mmol/L — ABNORMAL LOW (ref 101–111)
GFR calc Af Amer: >60 mL/min (ref >60)
GFR calc non Af Amer: >60 mL/min (ref >60)
GLUCOSE: 228 mg/dL — AB (ref 65–99)
Potassium: 4.3 mmol/L (ref 3.5–5.1)
Sodium: 136 mmol/L (ref 135–145)

## 2017-08-19 LAB — GLUCOSE, CAPILLARY: GLUCOSE-CAPILLARY: 225 mg/dL — AB (ref 65–99)

## 2017-08-19 MED ORDER — ACETAMINOPHEN 325 MG PO TABS: 650.0000 mg | ORAL_TABLET | ORAL | Status: DC | PRN

## 2017-08-19 MED ORDER — ATORVASTATIN CALCIUM 40 MG PO TABS: 40.0000 mg | ORAL_TABLET | Freq: Every day | ORAL | 6 refills | Status: DC

## 2017-08-19 MED ORDER — METOPROLOL TARTRATE 25 MG PO TABS: 12.5000 mg | ORAL_TABLET | Freq: Two times a day (BID) | ORAL | 6 refills | Status: DC

## 2017-08-19 MED FILL — Heparin Sod (Porcine)-NaCl IV Soln 1000 Unit/500ML-0.9%: INTRAVENOUS | Qty: 1000 | Status: AC

## 2017-08-19 NOTE — Progress Notes (Signed)
[id number] Pt walked independently with steady gait and no CP. Tolerated well. Education completed with pt who voiced understanding. Pt has been taking plavix and knows importance of doing that. Reviewed NTG use, ex ed and gave heart healthy and diabetic diets. Pt has been really trying to improve diet. Let him know A1C was at 8.2. Will refer to [place] CRP 2 but pt works in [place] and will not be able to do because of work schedule. Will refer in case something changes. Pt has equipment at home that he exercises on. Gave walking instructions. Luetta Nutting RN BSN 08/19/2017 8:59 AM

## 2017-08-19 NOTE — Discharge Instructions (Signed)
Call [facility] at [phone] if any bleeding, swelling or drainage at cath site.  May shower, no tub baths for 48 hours for groin sticks. No lifting over 5 pounds for 3 days.  No Driving for 3 days.   Heart Healthy Diabetic diet   Call the office if any problems or questions.

## 2017-08-19 NOTE — Discharge Summary (Addendum)
Discharge Summary    Patient ID: Danny [name],  MRN: [id number], DOB/AGE: 1966-01-05 52 y.o.  Admit date: 08/15/2017 Discharge date: 08/19/2017  Primary Care Provider: Hoyt Koch Primary Cardiologist: [name], MD  Discharge Diagnoses    Principal Problem:   Unstable angina [facility]) Active Problems:   CAD S/P percutaneous coronary angioplasty   Dyslipidemia   Insulin dependent diabetes mellitus (HCC)   CAD in native artery, hx of multiple PCIs  Allergies Allergies  Allergen Reactions  . Rosuvastatin     Other reaction(s): MUSCLE PAIN  . Vascepa [Epa Ethyl Ester] Other (See Comments)    Caused chest pain  . Ampicillin Nausea And Vomiting    Has patient had a PCN reaction causing immediate rash, facial/tongue/throat swelling, SOB or lightheadedness with hypotension: No Has patient had a PCN reaction causing severe rash involving mucus membranes or skin necrosis: No Has patient had a PCN reaction that required hospitalization: No Has patient had a PCN reaction occurring within the last 10 years: No If all of the above answers are "NO", then may proceed with Cephalosporin use.   Digestive reaction at age 82  . Metformin Diarrhea  . Penicillins Nausea And Vomiting    Digestive reaction    Diagnostic Studies/Procedures    08/18/17 cardiac cath  LEFT HEART CATH AND CORONARY ANGIOGRAPHY  Conclusion     Prox Cx lesion is 30% stenosed.  Ost LAD to Mid LAD lesion is 25% stenosed.  Previously placed Prox Cx to Mid Cx stent (DES) is widely patent.  Ost 1st Diag lesion is 50% stenosed.  Prox RCA - proximal stent is 50% stenosed.  Prox RCA to Mid RCA stent is 30% stenosed.  Dist RCA-1 lesion is 30% stenosed. Post Atrio lesion is 60% stenosed.  _______________________  CULPRIT LESION: Dist RCA-2 lesion is 95% stenosed.  A drug-eluting stent was successfully placed using a STENT SYNERGY DES 2.75X12.  Post intervention, there is a 0% residual  stenosis.  __________________________________  The left ventricular systolic function is normal.  LV end diastolic pressure is normal.  The left ventricular ejection fraction is 55-65% by visual estimate.  There is no aortic valve stenosis.  There is no mitral valve stenosis and no mitral valve prolapse evident.   Severe dRCA stenosis (progressive of disease) - s/p Successful DES PCI - 2.75 x 12 (2.9 mm). Moderate proximal to mid RCA in-stent restenosis. Moderate distal RPL branch disease.  Plan: Transfer to [facility] post procedure unit for post PCI care. Continue aspirin Plavix. Continue statin.  Add beta-blocker.  Plan discharge tomorrow.    _____________   History of Present Illness     86 yom with hx of CAD and multiple PCIs, DM2 on insulin, HLD, GERD and obesity presented to ER 08/16/17 with progressive chest discomfort, substernal pressure with activity, resolved with rest.  By admit pain was present throughout the day.  NTG did help symptoms.  He had not been taking zocor due to cramping and TG were recently elevated.   EKG without acute changes, initial troponin neg.  He was placed on IV heparin and admitted to hospital.  Plans for cardiac cath.     Hospital Course     Consultants: none   Pt's troponin pk at 0.88.  He underwent cardiac cath and found to have tight distal RCA-2 and underwent PCI with DES synergy.  Other stents with non obstructed disease.    Pt's post EKGs normal and by 08/19/17 was seen and  evaluated by Dr.  Delton See found stable for discharge.  He will follow up in 10-15 days.  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  Continue ASA and plavix.  We changed zocor to lipitor.  Added Lovaza.    His diabetes was stable and will return to usual insulin dosing.  Low dose BB added.   Cardiac rehab has seen pt.   _____________  Discharge Vitals Blood pressure 115/78, pulse 68, temperature 98.3 F (36.8 C), resp. rate 14, height  (1.676 m),  weight 175 lb 4.3 oz (79.5 kg), SpO2 97 %.  Filed Weights   08/17/17 0424 08/18/17 0455 08/19/17 0646  Weight: 174 lb 6.4 oz (79.1 kg) 173 lb 9.6 oz (78.7 kg) 175 lb 4.3 oz (79.5 kg)   Echo  Study Conclusions  - Left ventricle: The cavity size was normal. Systolic function was   vigorous. The estimated ejection fraction was in the range of 65%   to 70%. Wall motion was normal; there were no regional wall   motion abnormalities. Doppler parameters are consistent with   abnormal left ventricular relaxation (grade 1 diastolic   dysfunction). There was no evidence of elevated ventricular   filling pressure by Doppler parameters. - Aortic valve: Transvalvular velocity was minimally increased.   There was no stenosis. Valve area (VTI): 2.23 cm^2. Valve area   (Vmax): 2.06 cm^2. Valve area (Vmean): 2.22 cm^2. - Aortic root: The aortic root was normal in size. - Mitral valve: There was no regurgitation. - Right ventricle: Systolic function was normal. - Tricuspid valve: There was mild regurgitation. - Pulmonary arteries: Systolic pressure was within the normal   range. - Inferior vena cava: The vessel was normal in size. - Pericardium, extracardiac: There was no pericardial effusion.  Labs & Radiologic Studies    CBC Recent Labs    08/18/17 0706 08/19/17 0106  WBC 8.8 10.7*  HGB 14.5 14.0  HCT 40.0 38.3*  MCV 84.7 86.1  PLT 224 225   Basic Metabolic Panel Recent Labs    69/62/95 0106  NA 136  K 4.3  CL 100*  CO2 29  GLUCOSE 228*  BUN 13  CREATININE 0.85  CALCIUM 9.0   Liver Function Tests No results for input(s): AST, ALT, ALKPHOS, BILITOT, PROT, ALBUMIN in the last 72 hours. No results for input(s): LIPASE, AMYLASE in the last 72 hours. Cardiac Enzymes Recent Labs    08/16/17 1414  TROPONINI <0.03   BNP Invalid input(s): POCBNP D-Dimer No results for input(s): DDIMER in the last 72 hours. Hemoglobin A1C No results for input(s): HGBA1C in the last 72  hours. Fasting Lipid Panel No results for input(s): CHOL, HDL, LDLCALC, TRIG, CHOLHDL, LDLDIRECT in the last 72 hours. Thyroid Function Tests No results for input(s): TSH, T4TOTAL, T3FREE, THYROIDAB in the last 72 hours.  Invalid input(s): FREET3 _____________  Dg Chest 2 View  Result Date: 08/15/2017 CLINICAL DATA:  Left-sided chest pain radiating to the left jaw. History of diabetes and heart disease. Nonsmoker. EXAM: CHEST - 2 VIEW COMPARISON:  01/05/2015 FINDINGS: Normal heart size and pulmonary vascularity. No focal airspace disease or consolidation in the lungs. No blunting of costophrenic angles. No pneumothorax. Mediastinal contours appear intact. Coronary artery stent. Degenerative changes in the spine and shoulders. IMPRESSION: No active cardiopulmonary disease. Electronically Signed   By: Burman Nieves M.D.   On: 08/15/2017 23:10   Disposition   Pt is being discharged home today in good condition.  Follow-up Plans &  Appointments   Call [facility] at [phone] if any bleeding, swelling or drainage at cath site.  May shower, no tub baths for 48 hours for groin sticks. No lifting over 5 pounds for 3 days.  No Driving for 3 days.   Heart Healthy Diabetic diet   Call the office if any problems or questions.     Follow-up Information    [name], MD Follow up on 08/25/2017.   Specialty:  Cardiology Why:  at 10:20 AM Contact information: [address] [place] [address] [phone]          Discharge Instructions    Amb Referral to Cardiac Rehabilitation   Complete by:  As directed    Diagnosis:  Coronary Stents      Discharge Medications   Allergies as of 08/19/2017      Reactions   Rosuvastatin    Other reaction(s): MUSCLE PAIN   Vascepa [epa Ethyl Ester] Other (See Comments)   Caused chest pain   Ampicillin Nausea And Vomiting   Has patient had a PCN reaction causing immediate rash, facial/tongue/throat  swelling, SOB or lightheadedness with hypotension: No Has patient had a PCN reaction causing severe rash involving mucus membranes or skin necrosis: No Has patient had a PCN reaction that required hospitalization: No Has patient had a PCN reaction occurring within the last 10 years: No If all of the above answers are "NO", then may proceed with Cephalosporin use. Digestive reaction at age 21   Metformin Diarrhea   Penicillins Nausea And Vomiting   Digestive reaction      Medication List    STOP taking these medications   simvastatin 20 MG tablet Commonly known as:  ZOCOR     TAKE these medications   acetaminophen 325 MG tablet Commonly known as:  TYLENOL Take 2 tablets (650 mg total) by mouth every 4 (four) hours as needed for headache or mild pain.   aspirin 81 MG EC tablet Take 1 tablet (81 mg total) by mouth daily.   atorvastatin 40 MG tablet Commonly known as:  LIPITOR Take 1 tablet (40 mg total) by mouth daily at 6 PM.   clopidogrel 75 MG tablet Commonly known as:  PLAVIX TAKE 1 TABLET BY MOUTH EVERY DAY   insulin aspart 100 UNIT/ML injection Commonly known as:  novoLOG Inject 5-15 Units into the skin 3 (three) times daily before meals. Per carb counting   LANTUS 100 UNIT/ML injection Generic drug:  insulin glargine Inject 25 Units into the skin 2 (two) times daily.   metoprolol tartrate 25 MG tablet Commonly known as:  LOPRESSOR Take 0.5 tablets (12.5 mg total) by mouth 2 (two) times daily.   NITROSTAT 0.4 MG SL tablet Generic drug:  nitroGLYCERIN PLACE 1 TABLET (0.4 MG TOTAL) UNDER THE TONGUE EVERY 5 (FIVE) MINUTES AS NEEDED FOR CHEST PAIN.        Aspirin prescribed at discharge?  Yes High Intensity Statin Prescribed? (Lipitor 40-80mg  or Crestor 20-40mg ): Yes Beta Blocker Prescribed? Yes For EF <40%, was ACEI/ARB Prescribed? No: na ADP Receptor Inhibitor Prescribed? (i.e. Plavix etc.-Includes Medically Managed Patients): Yes For EF <40%, Aldosterone  Inhibitor Prescribed? No: na Was EF assessed during THIS hospitalization? Yes Was Cardiac Rehab II ordered? (Included Medically managed Patients): Yes   Outstanding Labs/Studies   BMP  Duration of Discharge Encounter   Greater than 30 minutes including physician time.  Signed, Sharlotte Alamo, NP 08/19/2017, 8:58 AM  The patient was seen,  examined and discussed with Nada Boozer, NP and I agree with the above.   The patient has walked half a mile this am, o symptoms, stable right radial access site, no bleeding, normal peripheral pulses, normal vitals and telemetry. Stabe for discharge, we will arrange an outpatient follow up.  Tobias Alexander, MD 08/19/2017

## 2017-08-19 NOTE — Progress Notes (Signed)
Progress Note  Patient Name: Danny [name] Date of Encounter: 08/19/2017  Primary Cardiologist: [name], MD   Subjective   No chest pain and no SOB, has walked freq this AM.  He works with IT.   Inpatient Medications    Scheduled Meds: . angioplasty book   Does not apply Once  . aspirin EC  81 mg Oral Daily  . atorvastatin  40 mg Oral q1800  . clopidogrel  75 mg Oral Daily  . insulin aspart  0-15 Units Subcutaneous TID WC  . insulin glargine  15 Units Subcutaneous BID  . metoprolol tartrate  12.5 mg Oral BID  . sodium chloride flush  3 mL Intravenous Q12H   Continuous Infusions: . sodium chloride     PRN Meds: sodium chloride, acetaminophen, nitroGLYCERIN, ondansetron (ZOFRAN) IV, sodium chloride flush   Vital Signs    Vitals:   08/18/17 1915 08/18/17 2000 08/19/17 0646 08/19/17 0709  BP: 135/81 112/60 117/73 115/78  Pulse: 77 83 67 68  Resp: Temp:  98.5 F (36.9 C) 98.3 F (36.8 C) 98.3 F (36.8 C)  TempSrc:  Oral Oral   SpO2: 98% 99% 96% 97%  Weight:   175 lb 4.3 oz (79.5 kg)   Height:        Intake/Output Summary (Last 24 hours) at 08/19/2017 0748 Last data filed at 08/19/2017 0649 Gross per 24 hour  Intake 2146.21 ml  Output 1175 ml  Net 971.21 ml   Filed Weights   08/17/17 0424 08/18/17 0455 08/19/17 0646  Weight: 174 lb 6.4 oz (79.1 kg) 173 lb 9.6 oz (78.7 kg) 175 lb 4.3 oz (79.5 kg)    Telemetry    SR - Personally Reviewed  ECG    SR no acute changes normal. - Personally Reviewed  Physical Exam   GEN: No acute distress.   Neck: No JVD Cardiac: RRR, no murmurs, rubs, or gallops. Rt wrist cath site without hematoma and 2 + radial pulse Respiratory: Clear to auscultation bilaterally. GI: Soft, nontender, non-distended  MS: No edema; No deformity. Neuro:  Nonfocal  Psych: Normal affect   Labs    Chemistry Recent Labs  Lab 08/15/17 2241 08/16/17 0844 08/19/17 0106  NA 136 138 136  K 3.8 4.3 4.3  CL 105 106  100*  CO2 GLUCOSE 175* 213* 228*  BUN CREATININE 0.75 0.61 0.85  CALCIUM 8.7* 8.6* 9.0  GFRNONAA >60 >60 >60  GFRAA >60 >60 >60  ANIONGAP Hematology Recent Labs  Lab 08/17/17 0306 08/18/17 0706 08/19/17 0106  WBC 8.9 8.8 10.7*  RBC 4.57 4.72 4.45  HGB 14.1 14.5 14.0  HCT 39.5 40.0 38.3*  MCV 86.4 84.7 86.1  MCH 30.9 30.7 31.5  MCHC 35.7 36.3* 36.6*  RDW 11.9 11.9 12.0  PLT 232 224 225    Cardiac Enzymes Recent Labs  Lab 08/16/17 0320 08/16/17 0844 08/16/17 1414  TROPONINI <0.03 <0.03 <0.03    Recent Labs  Lab 08/15/17 2307  TROPIPOC 0.01     BNPNo results for input(s): BNP, PROBNP in the last 168 hours.   DDimer No results for input(s): DDIMER in the last 168 hours.   Radiology    No results found.  Cardiac Studies   Cardiac cath 08/18/17 Procedures   CORONARY STENT INTERVENTION  LEFT HEART CATH AND CORONARY ANGIOGRAPHY  Conclusion     Prox Cx lesion is  30% stenosed.  Ost LAD to Mid LAD lesion is 25% stenosed.  Previously placed Prox Cx to Mid Cx stent (DES) is widely patent.  Ost 1st Diag lesion is 50% stenosed.  Prox RCA - proximal stent is 50% stenosed.  Prox RCA to Mid RCA stent is 30% stenosed.  Dist RCA-1 lesion is 30% stenosed. Post Atrio lesion is 60% stenosed.  _______________________  CULPRIT LESION: Dist RCA-2 lesion is 95% stenosed.  A drug-eluting stent was successfully placed using a STENT SYNERGY DES 2.75X12.  Post intervention, there is a 0% residual stenosis.  __________________________________  The left ventricular systolic function is normal.  LV end diastolic pressure is normal.  The left ventricular ejection fraction is 55-65% by visual estimate.  There is no aortic valve stenosis.  There is no mitral valve stenosis and no mitral valve prolapse evident.   Severe dRCA stenosis (progressive of disease) - s/p Successful DES PCI - 2.75 x 12 (2.9 mm). Moderate proximal to mid  RCA in-stent restenosis. Moderate distal RPL branch disease.  Plan: Transfer to 6 [facility] post procedure unit for post PCI care. Continue aspirin Plavix. Continue statin.  Add beta-blocker.  Plan discharge tomorrow.     Diagnostic Diagram       Post-Intervention Diagram        08/17/17 Echo  Study Conclusions  - Left ventricle: The cavity size was normal. Systolic function was   vigorous. The estimated ejection fraction was in the range of 65%   to 70%. Wall motion was normal; there were no regional wall   motion abnormalities. Doppler parameters are consistent with   abnormal left ventricular relaxation (grade 1 diastolic   dysfunction). There was no evidence of elevated ventricular   filling pressure by Doppler parameters. - Aortic valve: Transvalvular velocity was minimally increased.   There was no stenosis. Valve area (VTI): 2.23 cm^2. Valve area   (Vmax): 2.06 cm^2. Valve area (Vmean): 2.22 cm^2. - Aortic root: The aortic root was normal in size. - Mitral valve: There was no regurgitation. - Right ventricle: Systolic function was normal. - Tricuspid valve: There was mild regurgitation. - Pulmonary arteries: Systolic pressure was within the normal   range. - Inferior vena cava: The vessel was normal in size. - Pericardium, extracardiac: There was no pericardial effusion.  Patient Profile     52 y.o. male with a history of CAD with multiple PCI, DM2, HLD, GERD, obesity admitted with chest pain.    Assessment & Plan    Unstable angina/NSTEMI --with troponin 0.88  Normal EF   S/p stent DES to dist RCA-2 lesion  On plavix and ASA plan for DAPT for at least a year.  Continue STATIN --  EKG today SR normal EKG  CAD with hx of multiple PCIs    HLD continue statin.  DM-2  Improved glucose with increase of lantus, will return to home dose   Cardiac rehab is seeing now, plan for discharge.    For questions or updates, please contact [facility] Please  consult [email or web address] for contact info under Cardiology/STEMI.      Signed, Nada Boozer, NP  08/19/2017, 7:48 AM

## 2017-08-23 ENCOUNTER — Encounter: Payer: Self-pay | Admitting: Cardiology

## 2017-08-23 NOTE — Progress Notes (Signed)
Cardiology Office Note   Date:  08/25/2017   ID:  Danny [name], DOB 09/22/1965, MRN [id number]  PCP:  Danny Koch, Danny [name]  Cardiologist:   [name], Danny [name]  Chief Complaint  Patient presents with  . Coronary Artery Disease      History of Present Illness: Danny [name] is a 52 y.o. male who presents for hospital follow up after recent admission with unstable angina.  He had PCI of a distal RCA stenosis.    Since he was discharged she is done relatively well.  He is been a little more tired but he has gone back to work.  He is done some walking.  He has not had any of the chest discomfort that was his angina. The patient denies any new symptoms such as chest discomfort, neck or arm discomfort. There has been no new shortness of breath, PND or orthopnea. There have been no reported palpitations, presyncope or syncope.     Past Medical History:  Diagnosis Date  . CAD (coronary artery disease)    COSTAR study DES Stent to RCA 2006;   LHC  05/19/12: oLM 20%, LAD 40-50%, pD1 50%, mCFX 30-40%, mRCA stent with severe ISR and 70% beyond the stent, dRCA 50%, pPDA 50%, pPLA 30-40%.  EF 55-65%. =>  PCI:  Overlapping Promus DES x2 to the mid RCA ISR. [place] s/p DES to LCx  c.95% circ DES 10/16,  [place]   . CAD in native artery, hx of multiple PCIs 08/19/2017  . DM2 (diabetes mellitus, type 2) (HCC)   . GERD (gastroesophageal reflux disease)   . HLD (hyperlipidemia)   . Obesity     Past Surgical History:  Procedure Laterality Date  . CARDIAC CATHETERIZATION N/A 01/05/2015   Procedure: Left Heart Cath and Coronary Angiography;  Surgeon: Peter M Sw[place]and, Danny [name];  Location: [facility] INVASIVE CV LAB;  Service: Cardiovascular;  Laterality: N/A;  . CARDIAC CATHETERIZATION  01/05/2015   Procedure: Coronary Stent Intervention;  Surgeon: Peter M [place], Danny [name];  Location: [facility] INVASIVE CV LAB;  Service: Cardiovascular;;  . CORONARY ANGIOPLASTY WITH STENT PLACEMENT  05/19/2012   DES   to RCA   by Dr Excell Seltzer  . CORONARY  STENT INTERVENTION N/A 08/18/2017   Procedure: CORONARY STENT INTERVENTION;  Surgeon: Marykay Lex, Danny [name];  Location: [facility] INVASIVE CV LAB;  Service: Cardiovascular;  Laterality: N/A;  . CORONARY STENT PLACEMENT  01/05/2015   mid cx  des  . LEFT HEART CATH AND CORONARY ANGIOGRAPHY N/A 08/18/2017   Procedure: LEFT HEART CATH AND CORONARY ANGIOGRAPHY;  Surgeon: Marykay Lex, Danny [name];  Location: [facility] INVASIVE CV LAB;  Service: Cardiovascular;  Laterality: N/A;  . PERCUTANEOUS CORONARY STENT INTERVENTION (PCI-S) N/A 05/19/2012   Procedure: PERCUTANEOUS CORONARY STENT INTERVENTION (PCI-S);  Surgeon: Tonny Bollman, Danny [name];  Location: [facility] CATH LAB;  Service: Cardiovascular;  Laterality: N/A;     Current Outpatient Medications  Medication Sig Dispense Refill  . acetaminophen (TYLENOL) 325 MG tablet Take 2 tablets (650 mg total) by mouth every 4 (four) hours as needed for headache or mild pain.    Marland Kitchen aspirin EC 81 MG EC tablet Take 1 tablet (81 mg total) by mouth daily.    Marland Kitchen atorvastatin (LIPITOR) 40 MG tablet Take 1 tablet (40 mg total) by mouth daily at 6 PM. 30 tablet 6  . clopidogrel (PLAVIX) 75 MG tablet TAKE 1 TABLET BY MOUTH EVERY DAY 30 tablet 11  . insulin aspart (NOVOLOG) 100 UNIT/ML injection Inject 5-15 Units  into the skin 3 (three) times daily before meals. Per carb counting    . insulin glargine (LANTUS) 100 UNIT/ML injection Inject 25 Units into the skin 2 (two) times daily.    . metoprolol tartrate (LOPRESSOR) 25 MG tablet Take 0.5 tablets (12.5 mg total) by mouth 2 (two) times daily. 30 tablet 6  . NITROSTAT 0.4 MG SL tablet PLACE 1 TABLET (0.4 MG TOTAL) UNDER THE TONGUE EVERY 5 (FIVE) MINUTES AS NEEDED FOR CHEST PAIN. 25 tablet 2   No current facility-administered medications for this visit.     Allergies:   Rosuvastatin; Vascepa [epa ethyl ester]; Ampicillin; Metformin; and Penicillins    ROS:  Please see the history of present illness.   Otherwise, review of systems are positive for none.    All other systems are reviewed and negative.    PHYSICAL EXAM: VS:  BP 114/68 (BP Location: Left Arm, Patient Position: Sitting, Cuff Size: Normal)   Pulse 72   Ht 5\' 5"  (1.651 m)   Wt 178 lb (80.7 kg)   BMI 29.62 kg/m  , BMI Body mass index is 29.62 kg/m. GENERAL:  Well appearing NECK:  No jugular venous distention, waveform within normal limits, carotid upstroke brisk and symmetric, no bruits, no thyromegaly LUNGS:  Clear to auscultation bilaterally CHEST:  Unremarkable HEART:  PMI not displaced or sustained,S1 and S2 within normal limits, no S3, no S4, no clicks, no rubs, no murmurs ABD:  Flat, positive bowel sounds normal in frequency in pitch, no bruits, no rebound, no guarding, no midline pulsatile mass, no hepatomegaly, no splenomegaly EXT:  2 plus pulses throughout, no edema, no cyanosis no clubbing, right radial without bruising or bleeding.  Slight right wrist bruising.    EKG:  EKG is ordered today.   Recent Labs: 08/19/2017: BUN 13; Creatinine, Ser 0.85; Hemoglobin 14.0; Platelets 225; Potassium 4.3; Sodium 136    Lipid Panel    Component Value Date/Time   CHOL 163 08/16/2017 0320   TRIG 619 (H) 08/16/2017 0320   HDL 22 (L) 08/16/2017 0320   CHOLHDL 7.4 08/16/2017 0320   VLDL UNABLE TO CALCULATE IF TRIGLYCERIDE OVER 400 mg/dL 40/98/1191 4782   LDLCALC UNABLE TO CALCULATE IF TRIGLYCERIDE OVER 400 mg/dL 95/62/1308 6578   LDLDIRECT 57.7 08/25/2012 0951      Wt Readings from Last 3 Encounters:  08/25/17 178 lb (80.7 kg)  08/19/17 175 lb 4.3 oz (79.5 kg)  12/23/16 173 lb (78.5 kg)      Other studies Reviewed: Additional studies/ records that were reviewed today include: Hospital records. Review of the above records demonstrates:  Please see elsewhere in the note.       CARDIAC CATH:  08/18/17     Conclusion     Prox Cx lesion is 30% stenosed.  Ost LAD to Mid LAD lesion is 25% stenosed.  Previously placed Prox Cx to Mid Cx stent (DES) is widely  patent.  Ost 1st Diag lesion is 50% stenosed.  Prox RCA - proximal stent is 50% stenosed.  Prox RCA to Mid RCA stent is 30% stenosed.  Dist RCA-1 lesion is 30% stenosed. Post Atrio lesion is 60% stenosed.  _______________________  CULPRIT LESION: Dist RCA-2 lesion is 95% stenosed.  A drug-eluting stent was successfully placed using a STENT SYNERGY DES 2.75X12.  Post intervention, there is a 0% residual stenosis.  __________________________________  The left ventricular systolic function is normal.  LV end diastolic pressure is normal.  The left ventricular ejection fraction is 55-65%  by visual estimate.  There is no aortic valve stenosis.  There is no mitral valve stenosis and no mitral valve prolapse evident.   Severe dRCA stenosis (progressive of disease) - s/p Successful DES PCI - 2.75 x 12 (2.9 mm). Moderate proximal to mid RCA in-stent restenosis. Moderate distal RPL branch disease.    ASSESSMENT AND PLAN:  CAD: The patient has coronary disease as described above.  For now he will continue the meds as listed.  However, he needs aggressive risk reduction.  He is not really wanted to take medications previously but I talked to him at length about the need for continued risk reduction given the extent of his disease at such a young age.  We reviewed his coronary anatomy.  DM:   His A1c in the hospital was 8.2.  He has this managed by Danny KochYousef, Deema, Danny [name] .  We should consider Jardiance in the future.    DYSLIPIDEMIA:    He has not tolerated higher dose statin.  He says that he is taking Vascepa although it was not on his med list.  I would like for him to see Dr. Rennis GoldenHilty.  This young man needs state of the art lipid management.     Current medicines are reviewed at length with the patient today.  The patient does not have concerns regarding medicines.  The following changes have been made:  no change  Labs/ tests ordered today include: None No orders of the defined types  were placed in this encounter.    Disposition:   FU with me in 4 months.      Signed, Rollene RotundaJames Josejulian Tarango, Danny [name]  08/25/2017 11:05 AM    [facility]

## 2017-08-25 ENCOUNTER — Ambulatory Visit (INDEPENDENT_AMBULATORY_CARE_PROVIDER_SITE_OTHER): Payer: BLUE CROSS/BLUE SHIELD | Admitting: Cardiology

## 2017-08-25 ENCOUNTER — Encounter: Payer: Self-pay | Admitting: Cardiology

## 2017-08-25 VITALS — BP 114/68 | HR 72 | Ht 65.0 in | Wt 178.0 lb

## 2017-08-25 DIAGNOSIS — E785 Hyperlipidemia, unspecified: Secondary | ICD-10-CM | POA: Diagnosis not present

## 2017-08-25 DIAGNOSIS — Z794 Long term (current) use of insulin: Secondary | ICD-10-CM | POA: Diagnosis not present

## 2017-08-25 DIAGNOSIS — I25110 Atherosclerotic heart disease of native coronary artery with unstable angina pectoris: Secondary | ICD-10-CM

## 2017-08-25 DIAGNOSIS — E118 Type 2 diabetes mellitus with unspecified complications: Secondary | ICD-10-CM

## 2017-08-25 NOTE — Patient Instructions (Signed)
Medication Instructions:  Continue current medications  If you need a refill on your cardiac medications before your next appointment, please call your pharmacy.  Labwork: None ordered   Testing/Procedures: None Ordered   Follow-Up: Your physician recommends that you schedule a follow-up appointment in: Lipid Clinic with Dr Rennis GoldenHilty.  Your physician wants you to follow-up in: 4 Months.    [facility] you [facility]!!

## 2017-09-01 ENCOUNTER — Ambulatory Visit: Payer: BLUE CROSS/BLUE SHIELD | Admitting: Cardiology

## 2017-09-01 ENCOUNTER — Telehealth (HOSPITAL_COMMUNITY): Payer: Self-pay

## 2017-09-01 DIAGNOSIS — I2510 Atherosclerotic heart disease of native coronary artery without angina pectoris: Secondary | ICD-10-CM | POA: Diagnosis not present

## 2017-09-01 DIAGNOSIS — E782 Mixed hyperlipidemia: Secondary | ICD-10-CM | POA: Diagnosis not present

## 2017-09-01 DIAGNOSIS — E1165 Type 2 diabetes mellitus with hyperglycemia: Secondary | ICD-10-CM | POA: Diagnosis not present

## 2017-09-01 NOTE — Telephone Encounter (Signed)
Patients insurance is active and benefits verified through [place] - No co-pay, deductible amount of $4,000/$4,000 has been met, out of pocket amount of $6,650/$6,650 has been met, no co-insurance, and no pre-authorization is required. Reference [phone]  Will contact patient to see if he is interested in the Cardiac Rehab Program. If interested, patient will be contacted for scheduling upon review by the RN navigator.

## 2017-09-01 NOTE — Telephone Encounter (Signed)
Attempted to call patient in regards to Cardiac Rehab - lm on vm °

## 2017-09-11 ENCOUNTER — Telehealth (HOSPITAL_COMMUNITY): Payer: Self-pay

## 2017-09-11 NOTE — Telephone Encounter (Signed)
Called pt to see if he would like to proceed with scheduling for CR, he stated no. No reason given.  Closed referral

## 2017-10-14 DIAGNOSIS — I2510 Atherosclerotic heart disease of native coronary artery without angina pectoris: Secondary | ICD-10-CM | POA: Diagnosis not present

## 2017-10-14 DIAGNOSIS — E1165 Type 2 diabetes mellitus with hyperglycemia: Secondary | ICD-10-CM | POA: Diagnosis not present

## 2017-10-14 DIAGNOSIS — E1140 Type 2 diabetes mellitus with diabetic neuropathy, unspecified: Secondary | ICD-10-CM | POA: Diagnosis not present

## 2017-10-14 DIAGNOSIS — E1169 Type 2 diabetes mellitus with other specified complication: Secondary | ICD-10-CM | POA: Diagnosis not present

## 2017-10-14 DIAGNOSIS — Z794 Long term (current) use of insulin: Secondary | ICD-10-CM | POA: Diagnosis not present

## 2017-10-22 ENCOUNTER — Ambulatory Visit (INDEPENDENT_AMBULATORY_CARE_PROVIDER_SITE_OTHER): Payer: BLUE CROSS/BLUE SHIELD | Admitting: Internal Medicine

## 2017-10-22 ENCOUNTER — Encounter: Payer: Self-pay | Admitting: Internal Medicine

## 2017-10-22 VITALS — BP 108/62 | HR 96 | Ht 65.0 in | Wt 179.2 lb

## 2017-10-22 DIAGNOSIS — E781 Pure hyperglyceridemia: Secondary | ICD-10-CM | POA: Diagnosis not present

## 2017-10-22 DIAGNOSIS — E119 Type 2 diabetes mellitus without complications: Secondary | ICD-10-CM | POA: Diagnosis not present

## 2017-10-22 DIAGNOSIS — Z794 Long term (current) use of insulin: Secondary | ICD-10-CM

## 2017-10-22 DIAGNOSIS — I2510 Atherosclerotic heart disease of native coronary artery without angina pectoris: Secondary | ICD-10-CM | POA: Diagnosis not present

## 2017-10-22 DIAGNOSIS — IMO0001 Reserved for inherently not codable concepts without codable children: Secondary | ICD-10-CM

## 2017-10-22 DIAGNOSIS — E785 Hyperlipidemia, unspecified: Secondary | ICD-10-CM | POA: Diagnosis not present

## 2017-10-22 MED ORDER — ICOSAPENT ETHYL 1 G PO CAPS: 2.0000 g | ORAL_CAPSULE | Freq: Two times a day (BID) | ORAL | 3 refills | Status: DC

## 2017-10-22 NOTE — Patient Instructions (Addendum)
Medication Instructions:   START vascepa 2gram twice daily  -- provided 2 boxes samples, co-pay card Please do not take any over-the-counter fish oil  Labwork:  Dr. Rennis GoldenHilty would like you to have FASTING lab work   Testing/Procedures:  NONE  Follow-Up:  Your physician wants you to follow-up in: 3 months with Dr. Rennis GoldenHilty for LIPID CLINIC appointment   If you need a refill on your cardiac medications before your next appointment, please call your pharmacy.  Any Other Special Instructions Will Be Listed Below (If Applicable).

## 2017-10-22 NOTE — Progress Notes (Signed)
OFFICE CONSULT NOTE  Chief Complaint:  Lipid clinic evaluation  Primary Care Physician: Hoyt KochYousef, Deema, MD (Inactive)  HPI:  Danny [name] is a 52 y.o. male who is being seen today for the evaluation of dyslipidemia at the request of Dr. Antoine PocheHochrein. This is a pleasant 52 year old male was kindly referred to me by Dr. Antoine PocheHochrein for evaluation of dyslipidemia.  He does have a history of ASCVD with prior PCI to the right coronary artery in 2006.  He has had additional coronary interventions subsequently in 2016, and has type 2 diabetes, marked dyslipidemia and family history of heart disease.  In addition he has a history of statin intolerance, having had side effects on simvastatin and rosuvastatin which caused severe leg cramps at night.  Recently he was switched to atorvastatin after another PCI in May 2019 and reports he had significant leg pain on the 40 mg dose since then.  His most recent lipid profile from May showed a total cholesterol 163, triglycerides 619, HDL 22 and LDL was not able to be calculated.  Hemoglobin A1c of 8.2.  He reports long-standing history of increased triglycerides, despite eating a plant-based diet and trying to watch carbohydrate intake.  PMHx:  Past Medical History:  Diagnosis Date  . CAD (coronary artery disease)    COSTAR study DES Stent to RCA 2006;   LHC  05/19/12: oLM 20%, LAD 40-50%, pD1 50%, mCFX 30-40%, mRCA stent with severe ISR and 70% beyond the stent, dRCA 50%, pPDA 50%, pPLA 30-40%.  EF 55-65%. =>  PCI:  Overlapping Promus DES x2 to the mid RCA ISR. [place] s[place] DES to LCx  c.95% circ DES 10/16,  [place]   . CAD in native artery, hx of multiple PCIs 08/19/2017  . DM2 (diabetes mellitus, type 2) (HCC)   . GERD (gastroesophageal reflux disease)   . HLD (hyperlipidemia)   . Obesity     Past Surgical History:  Procedure Laterality Date  . CARDIAC CATHETERIZATION N/A 01/05/2015   Procedure: Left Heart Cath and Coronary Angiography;  Surgeon: Peter M SwazilandJordan, MD;   [facility];  Service: Cardiovascular;  Laterality: N/A;  . CARDIAC CATHETERIZATION  01/05/2015   Procedure: Coronary Stent Intervention;  Surgeon: Peter M SwazilandJordan, MD;  [facility];  Service: Cardiovascular;;  . CORONARY ANGIOPLASTY WITH STENT PLACEMENT  05/19/2012   DES   to RCA   by Dr Excell Seltzerooper  . CORONARY STENT INTERVENTION N/A 08/18/2017   Procedure: CORONARY STENT INTERVENTION;  Surgeon: Marykay LexHarding, David W, MD;  [facility];  Service: Cardiovascular;  Laterality: N/A;  . CORONARY STENT PLACEMENT  01/05/2015   mid cx  des  . LEFT HEART CATH AND CORONARY ANGIOGRAPHY N/A 08/18/2017   Procedure: LEFT HEART CATH AND CORONARY ANGIOGRAPHY;  Surgeon: Marykay LexHarding, David W, MD;  [facility];  Service: Cardiovascular;  Laterality: N/A;  . PERCUTANEOUS CORONARY STENT INTERVENTION (PCI-S) N/A 05/19/2012   Procedure: PERCUTANEOUS CORONARY STENT INTERVENTION (PCI-S);  Surgeon: Tonny BollmanMichael Cooper, MD;  [facility];  Service: Cardiovascular;  Laterality: N/A;    FAMHx:  Family History  Problem Relation Age of Onset  . Diabetes Mother   . Coronary artery disease Father 3159  . Heart attack Father     SOCHx:   reports that he has never smoked. He has never used smokeless tobacco. He reports that he drinks alcohol. He reports that he does not use drugs.  ALLERGIES:  Allergies  Allergen  Reactions  . Rosuvastatin     Other reaction(s): MUSCLE PAIN  . Ampicillin Nausea And Vomiting    Has patient had a PCN reaction causing immediate rash, facial/tongue/throat swelling, SOB or lightheadedness with hypotension: No Has patient had a PCN reaction causing severe rash involving mucus membranes or skin necrosis: No Has patient had a PCN reaction that required hospitalization: No Has patient had a PCN reaction occurring within the last 10 years: No If all of the above answers are "NO", then may proceed with Cephalosporin use.   Digestive  reaction at age 17  . Metformin Diarrhea  . Penicillins Nausea And Vomiting    Digestive reaction    ROS: Pertinent items noted in HPI and remainder of comprehensive ROS otherwise negative.  HOME MEDS: Current Outpatient Medications on File Prior to Visit  Medication Sig Dispense Refill  . acetaminophen (TYLENOL) 325 MG tablet Take 2 tablets (650 mg total) by mouth every 4 (four) hours as needed for headache or mild pain.    Marland Kitchen aspirin EC 81 MG EC tablet Take 1 tablet (81 mg total) by mouth daily.    Marland Kitchen atorvastatin (LIPITOR) 40 MG tablet Take 1 tablet (40 mg total) by mouth daily at 6 PM. 30 tablet 6  . clopidogrel (PLAVIX) 75 MG tablet TAKE 1 TABLET BY MOUTH EVERY DAY 30 tablet 11  . metoprolol tartrate (LOPRESSOR) 25 MG tablet Take 0.5 tablets (12.5 mg total) by mouth 2 (two) times daily. 30 tablet 6  . NITROSTAT 0.4 MG SL tablet PLACE 1 TABLET (0.4 MG TOTAL) UNDER THE TONGUE EVERY 5 (FIVE) MINUTES AS NEEDED FOR CHEST PAIN. 25 tablet 2  . TRESIBA FLEXTOUCH 200 UNIT/ML SOPN Take 40 mg by mouth daily.  5  . TRULICITY 0.75 MG/0.5ML SOPN Inject 1 Dose into the skin once a week.  5   No current facility-administered medications on file prior to visit.     LABS/IMAGING: No results found for this or any previous visit (from the past 48 hour(s)). No results found.  LIPID PANEL:    Component Value Date/Time   CHOL 163 08/16/2017 0320   TRIG 619 (H) 08/16/2017 0320   HDL 22 (L) 08/16/2017 0320   CHOLHDL 7.4 08/16/2017 0320   VLDL UNABLE TO CALCULATE IF TRIGLYCERIDE OVER 400 mg/dL 96/06/5407 8119   LDLCALC UNABLE TO CALCULATE IF TRIGLYCERIDE OVER 400 mg/dL 14/78/2956 2130   LDLDIRECT 57.7 08/25/2012 0951    WEIGHTS: Wt Readings from Last 3 Encounters:  10/22/17 179 lb 3.2 oz (81.3 kg)  08/25/17 178 lb (80.7 kg)  08/19/17 175 lb 4.3 oz (79.5 kg)    VITALS: BP 108/62   Pulse 96   Ht 5\' 5"  (1.651 m)   Wt 179 lb 3.2 oz (81.3 kg)   BMI 29.82 kg/m   EXAM: General appearance:  alert and no distress Neck: no carotid bruit, no JVD and thyroid not enlarged, symmetric, no tenderness/mass/nodules Lungs: clear to auscultation bilaterally Heart: regular rate and rhythm, S1, S2 normal, no murmur, click, rub or gallop Abdomen: soft, non-tender; bowel sounds normal; no masses,  no organomegaly Extremities: extremities normal, atraumatic, no cyanosis or edema and No tendon xanthomas Pulses: 2+ and symmetric Skin: Skin color, texture, turgor normal. No rashes or lesions Neurologic: Grossly normal Psych: Pleasant  EKG: Deferred  ASSESSMENT: 1. Coronary artery disease with multiple recurrent PCI's 2. Poorly controlled dyslipidemia with primary hypertriglyceridemia (suspect LPL deficiency) 3. Type 2 diabetes, A1c 8.2 4. Dyslipidemia 5. Statin intolerance  PLAN: 1.  Mr. Hoos has poorly controlled dyslipidemia with primary hypertriglyceridemia and triglycerides greater than 500.  Per current guidelines goal is initially for reduction in triglycerides to less than 500 to reduce the risk of pancreatitis.  The ideal treatment for him would be Vascepa.  Apparently he had tried this in the past and had some chest discomfort.  I suspect this was his angina and not a side effect of the medication.  He said he would be willing to try it again and we removed this as an intolerance on his list.  We will go ahead and start 2 g twice daily.  Hopefully this will make marked improvement in his triglycerides and according to the REDUCE-IT trial data, it is associate with 40% reduction in cardiovascular events and a cardiovascular mortality benefit.  I will then plan a repeat lipid profile in 3 months with direct LDL.  Based on that number and as to whether he is tolerating atorvastatin or not, we may consider discontinuing his statin and pursuing PCSK9 inhibitor therapy.  Thanks again for the kind referral.  Follow-up with me in 3 months.  Chrystie Nose, MD, [facility], FACP  [place]   [facility]  Medical Director of the Advanced Lipid Disorders &  Cardiovascular Risk Reduction Clinic Diplomate of the American Board of Clinical Lipidology Attending Cardiologist  Direct Dial: [phone]  Fax: [phone]  Website:  [email or web address] Darrelle Wiberg 10/22/2017, 11:44 AM

## 2018-01-14 ENCOUNTER — Ambulatory Visit: Payer: BLUE CROSS/BLUE SHIELD | Admitting: Internal Medicine

## 2018-02-16 ENCOUNTER — Ambulatory Visit: Payer: BLUE CROSS/BLUE SHIELD | Admitting: Internal Medicine

## 2018-03-09 ENCOUNTER — Ambulatory Visit: Payer: BLUE CROSS/BLUE SHIELD | Admitting: Internal Medicine

## 2018-03-15 NOTE — Telephone Encounter (Signed)
Pt advised of Dr. Jenene [name]'s recommendation to be seen in the office today or tomorrow or report to the ED. Advised pt that we have no appointments available and to report to ED. Pt states he can not do that and has a life to live so he wont be able to go until Thursday. Advised pt of risks. Pt states his manageable at this point and doesn't feel he need to go to ED. Appointment scheduled for pt to be seen in office on 03/22/18 at 1130 with Danny [name]. PA. Pt advised to go to ED if symptoms reoccur. Pt verbalized understanding.

## 2018-03-18 DIAGNOSIS — E1165 Type 2 diabetes mellitus with hyperglycemia: Secondary | ICD-10-CM | POA: Diagnosis not present

## 2018-03-18 DIAGNOSIS — E785 Hyperlipidemia, unspecified: Secondary | ICD-10-CM | POA: Diagnosis not present

## 2018-03-18 DIAGNOSIS — Z794 Long term (current) use of insulin: Secondary | ICD-10-CM | POA: Diagnosis not present

## 2018-03-18 DIAGNOSIS — E1169 Type 2 diabetes mellitus with other specified complication: Secondary | ICD-10-CM | POA: Diagnosis not present

## 2018-03-18 DIAGNOSIS — I2510 Atherosclerotic heart disease of native coronary artery without angina pectoris: Secondary | ICD-10-CM | POA: Diagnosis not present

## 2018-03-18 DIAGNOSIS — E1140 Type 2 diabetes mellitus with diabetic neuropathy, unspecified: Secondary | ICD-10-CM | POA: Diagnosis not present

## 2018-03-22 ENCOUNTER — Ambulatory Visit (INDEPENDENT_AMBULATORY_CARE_PROVIDER_SITE_OTHER): Payer: BLUE CROSS/BLUE SHIELD | Admitting: Physician Assistant

## 2018-03-22 ENCOUNTER — Encounter: Payer: Self-pay | Admitting: Physician Assistant

## 2018-03-22 VITALS — BP 106/60 | HR 69 | Ht 65.0 in | Wt 174.8 lb

## 2018-03-22 DIAGNOSIS — E781 Pure hyperglyceridemia: Secondary | ICD-10-CM

## 2018-03-22 DIAGNOSIS — E119 Type 2 diabetes mellitus without complications: Secondary | ICD-10-CM

## 2018-03-22 DIAGNOSIS — R079 Chest pain, unspecified: Secondary | ICD-10-CM

## 2018-03-22 DIAGNOSIS — I2510 Atherosclerotic heart disease of native coronary artery without angina pectoris: Secondary | ICD-10-CM | POA: Diagnosis not present

## 2018-03-22 DIAGNOSIS — Z794 Long term (current) use of insulin: Secondary | ICD-10-CM

## 2018-03-22 MED ORDER — ICOSAPENT ETHYL 1 G PO CAPS: 2.0000 g | ORAL_CAPSULE | Freq: Two times a day (BID) | ORAL | 3 refills | Status: DC

## 2018-03-22 MED ORDER — NITROGLYCERIN 0.4 MG SL SUBL: SUBLINGUAL_TABLET | SUBLINGUAL | 2 refills | Status: AC

## 2018-03-22 NOTE — Patient Instructions (Signed)
Medication Instructions:  Restart Metoprolol 12.5 mg twice daily. Increase Vascepa to 2 capsules twice daily.  If you need a refill on your cardiac medications before your next appointment, please call your pharmacy.    Follow-Up: At [facility], you and your health needs are our priority.  As part of our continuing mission to provide you with exceptional heart care, we have created designated Provider Care Teams.  These Care Teams include your primary Cardiologist (physician) and Advanced Practice Providers (APPs -  Physician Assistants and Nurse Practitioners) who all work together to provide you with the care you need, when you need it. . Follow up with Danny CourseHao Meng, PA in 2 weeks.

## 2018-03-22 NOTE — Progress Notes (Signed)
Cardiology Office Note    Date:  03/22/2018   ID:  Danny [name], DOB 1965-11-16, MRN [id number]  PCP:  Hoyt Koch, MD (Inactive)  Cardiologist: Dr. Antoine Poche Lipid clinic: Dr. Rennis Golden  Chief Complaint  Patient presents with  . Follow-up    seen for Dr. Antoine Poche. Chest pain    History of Present Illness:  Danny [name] is a 52 y.o. male with past medical history of CAD, DM 2, hyperlipidemia and obesity.  Patient had a history of PCI to distal RCA in 2006.  She had a severe in-stent restenosis by cardiac catheterization in February 2014 this was treated with a overlapping drug-eluting stent.  Patient had a cardiac catheterization on 01/05/2015 that showed 95% mid left circumflex lesion treated with a drug-eluting stent.  Echo obtained on 5/27 showed EF 65 to 70%, grade 1 DD, mild TR. Last cardiac catheterization obtained on 08/10/2017 showed 95% distal RCA lesion treated with 2.75 x 12 mm drug-eluting stent, EF 55 to 65%.  Patient was seen by Dr. Antoine Poche in June 2019, at which time he was doing well.  He was referred to Dr. Rennis Golden to manage hyperlipidemia, given his severely elevated triglyceride, Vascepa was added to his medical regimen.  Patient presents today for cardiology office visit.  For the past few weeks, he has been noticing a intermittent burning sensation in his chest.  This is similar to what he has experienced in the past with his anginal symptom.  It mainly occurs with severe exertion but not with every day activity.  For the past 2 weeks, he has experienced 3-4 episodes of this.  The last episode was about a week and a half ago.  He also attributed to high blood sugar during those events as well.  His EKG does not show any ischemic changes.  This is a patient who is quite accurate with his anginal symptom and when he does complain of angina, previous cardiac catheterization has always demonstrated a new blockage.  In this case, I do not recommend any stress test, however I do  recommend to either consider medical management versus cardiac catheterization.  After discussing various options, I recommended to do a trial of medical management first.  If he is still symptomatic in 2 weeks, then I will arrange for outpatient cardiac catheterization.  The patient has been off of his metoprolol for a while as he think the metoprolol was making him more angry than usual.  I recommended restarting metoprolol, we also discussed atenolol and Imdur.  He says he had a reaction with atenolol in the past as well.  He is willing to do another trial on the metoprolol.  His last triglyceride was also severely high, he was placed on Vascepa, however was not taking the full strength of the Vascepa.  Instead he is only taking 1 g twice a day, I recommend him to take 2 g twice a day of his Vascepa.  When he returns in 2 weeks, I will consider when to repeat a fasting lipid panel.   Past Medical History:  Diagnosis Date  . CAD (coronary artery disease)    COSTAR study DES Stent to RCA 2006;   LHC  05/19/12: oLM 20%, LAD 40-50%, pD1 50%, mCFX 30-40%, mRCA stent with severe ISR and 70% beyond the stent, dRCA 50%, pPDA 50%, pPLA 30-40%.  EF 55-65%. =>  PCI:  Overlapping Promus DES x2 to the mid RCA ISR. [place] s/p DES to LCx  c.95% circ DES  10/16,  [place]   . CAD in native artery, hx of multiple PCIs 08/19/2017  . DM2 (diabetes mellitus, type 2) (HCC)   . GERD (gastroesophageal reflux disease)   . HLD (hyperlipidemia)   . Obesity     Past Surgical History:  Procedure Laterality Date  . CARDIAC CATHETERIZATION N/A 01/05/2015   Procedure: Left Heart Cath and Coronary Angiography;  Surgeon: Peter M Swaziland, MD;  Location: [facility] INVASIVE CV LAB;  Service: Cardiovascular;  Laterality: N/A;  . CARDIAC CATHETERIZATION  01/05/2015   Procedure: Coronary Stent Intervention;  Surgeon: Peter M Swaziland, MD;  Location: [facility] INVASIVE CV LAB;  Service: Cardiovascular;;  . CORONARY ANGIOPLASTY WITH STENT PLACEMENT  05/19/2012     DES   to RCA   by Dr Excell Seltzer  . CORONARY STENT INTERVENTION N/A 08/18/2017   Procedure: CORONARY STENT INTERVENTION;  Surgeon: Marykay Lex, MD;  Location: [facility] INVASIVE CV LAB;  Service: Cardiovascular;  Laterality: N/A;  . CORONARY STENT PLACEMENT  01/05/2015   mid cx  des  . LEFT HEART CATH AND CORONARY ANGIOGRAPHY N/A 08/18/2017   Procedure: LEFT HEART CATH AND CORONARY ANGIOGRAPHY;  Surgeon: Marykay Lex, MD;  Location: [facility] INVASIVE CV LAB;  Service: Cardiovascular;  Laterality: N/A;  . PERCUTANEOUS CORONARY STENT INTERVENTION (PCI-S) N/A 05/19/2012   Procedure: PERCUTANEOUS CORONARY STENT INTERVENTION (PCI-S);  Surgeon: Tonny Bollman, MD;  [facility] C[facility] LAB;  Service: Cardiovascular;  Laterality: N/A;    Current Medications: Outpatient Medications Prior to Visit  Medication Sig Dispense Refill  . acetaminophen (TYLENOL) 325 MG tablet Take 2 tablets (650 mg total) by mouth every 4 (four) hours as needed for headache or mild pain.    Marland Kitchen aspirin EC 81 MG EC tablet Take 1 tablet (81 mg total) by mouth daily.    Marland Kitchen atorvastatin (LIPITOR) 40 MG tablet Take 1 tablet (40 mg total) by mouth daily at 6 PM. 30 tablet 6  . clopidogrel (PLAVIX) 75 MG tablet TAKE 1 TABLET BY MOUTH EVERY DAY 30 tablet 11  . TRESIBA FLEXTOUCH 200 UNIT/ML SOPN Take 40 mg by mouth daily.  5  . TRULICITY 0.75 MG/0.5ML SOPN Inject 1 Dose into the skin once a week.  5  . Icosapent Ethyl 1 g CAPS Take 2 capsules (2 g total) by mouth 2 (two) times daily. (Patient taking differently: Take 1 g by mouth 2 (two) times daily. ) 360 capsule 3  . NITROSTAT 0.4 MG SL tablet PLACE 1 TABLET (0.4 MG TOTAL) UNDER THE TONGUE EVERY 5 (FIVE) MINUTES AS NEEDED FOR CHEST PAIN. 25 tablet 2  . metoprolol tartrate (LOPRESSOR) 25 MG tablet Take 0.5 tablets (12.5 mg total) by mouth 2 (two) times daily. (Patient not taking: Reported on 03/22/2018) 30 tablet 6   No facility-administered medications prior to visit.      Allergies:    Rosuvastatin; Ampicillin; Metformin; and Penicillins   Social History   Socioeconomic History  . Marital status: Married    Spouse name: Not on file  . Number of children: Not on file  . Years of education: Not on file  . Highest education level: Not on file  Occupational History  . Not on file  Social Needs  . Financial resource strain: Not on file  . Food insecurity:    Worry: Not on file    Inability: Not on file  . Transportation needs:    Medical: Not on file    Non-medical: Not on file  Tobacco Use  . Smoking  status: Never Smoker  . Smokeless tobacco: Never Used  Substance and Sexual Activity  . Alcohol use: Yes    Comment: rare  . Drug use: No  . Sexual activity: Not on file  Lifestyle  . Physical activity:    Days per week: Not on file    Minutes per session: Not on file  . Stress: Not on file  Relationships  . Social connections:    Talks on phone: Not on file    Gets together: Not on file    Attends religious service: Not on file    Active member of club or organization: Not on file    Attends meetings of clubs or organizations: Not on file    Relationship status: Not on file  Other Topics Concern  . Not on file  Social History Narrative  . Not on file     Family History:  The patient's family history includes Coronary artery disease (age of onset: 5959) in his father; Diabetes in his mother; Heart attack in his father.   ROS:   Please see the history of present illness.    ROS All other systems reviewed and are negative.   PHYSICAL EXAM:   VS:  BP 106/60   Pulse 69   Ht 5\' 5"  (1.651 m)   Wt 174 lb 12.8 oz (79.3 kg)   SpO2 98%   BMI 29.09 kg/m    GEN: Well nourished, well developed, in no acute distress  HEENT: normal  Neck: no JVD, carotid bruits, or masses Cardiac: RRR; no murmurs, rubs, or gallops,no edema  Respiratory:  clear to auscultation bilaterally, normal work of breathing GI: soft, nontender, nondistended, + BS MS: no deformity or  atrophy  Skin: warm and dry, no rash Neuro:  Alert and Oriented x 3, Strength and sensation are intact Psych: euthymic mood, full affect  Wt Readings from Last 3 Encounters:  03/22/18 174 lb 12.8 oz (79.3 kg)  10/22/17 179 lb 3.2 oz (81.3 kg)  08/25/17 178 lb (80.7 kg)      Studies/Labs Reviewed:   EKG:  EKG is ordered today.  The ekg ordered today demonstrates normal sinus rhythm without significant ST-T wave changes  Recent Labs: 08/19/2017: BUN 13; Creatinine, Ser 0.85; Hemoglobin 14.0; Platelets 225; Potassium 4.3; Sodium 136   Lipid Panel    Component Value Date/Time   CHOL 163 08/16/2017 0320   TRIG 619 (H) 08/16/2017 0320   HDL 22 (L) 08/16/2017 0320   CHOLHDL 7.4 08/16/2017 0320   VLDL UNABLE TO CALCULATE IF TRIGLYCERIDE OVER 400 mg/dL 16/10/960405/26/2019 54090320   LDLCALC UNABLE TO CALCULATE IF TRIGLYCERIDE OVER 400 mg/dL 81/19/147805/26/2019 29560320   LDLDIRECT 57.7 08/25/2012 0951    Additional studies/ records that were reviewed today include:   Echo 08/17/2017 LV EF: 65% -   70% Study Conclusions  - Left ventricle: The cavity size was normal. Systolic function was   vigorous. The estimated ejection fraction was in the range of 65%   to 70%. Wall motion was normal; there were no regional wall   motion abnormalities. Doppler parameters are consistent with   abnormal left ventricular relaxation (grade 1 diastolic   dysfunction). There was no evidence of elevated ventricular   filling pressure by Doppler parameters. - Aortic valve: Transvalvular velocity was minimally increased.   There was no stenosis. Valve area (VTI): 2.23 cm^2. Valve area   (Vmax): 2.06 cm^2. Valve area (Vmean): 2.22 cm^2. - Aortic root: The aortic root was normal in size. -  Mitral valve: There was no regurgitation. - Right ventricle: Systolic function was normal. - Tricuspid valve: There was mild regurgitation. - Pulmonary arteries: Systolic pressure was within the normal   range. - Inferior vena cava: The  vessel was normal in size. - Pericardium, extracardiac: There was no pericardial effusion.   Cath 08/18/2017  Prox Cx lesion is 30% stenosed.  Ost LAD to Mid LAD lesion is 25% stenosed.  Previously placed Prox Cx to Mid Cx stent (DES) is widely patent.  Ost 1st Diag lesion is 50% stenosed.  Prox RCA - proximal stent is 50% stenosed.  Prox RCA to Mid RCA stent is 30% stenosed.  Dist RCA-1 lesion is 30% stenosed. Post Atrio lesion is 60% stenosed.  _______________________  CULPRIT LESION: Dist RCA-2 lesion is 95% stenosed.  A drug-eluting stent was successfully placed using a STENT SYNERGY DES 2.75X12.  Post intervention, there is a 0% residual stenosis.  __________________________________  The left ventricular systolic function is normal.  LV end diastolic pressure is normal.  The left ventricular ejection fraction is 55-65% by visual estimate.  There is no aortic valve stenosis.  There is no mitral valve stenosis and no mitral valve prolapse evident.   Severe dRCA stenosis (progressive of disease) - s/p Successful DES PCI - 2.75 x 12 (2.9 mm). Moderate proximal to mid RCA in-stent restenosis. Moderate distal RPL branch disease.  Plan: Transfer to 6 Central post procedure unit for post PCI care. Continue aspirin Plavix. Continue statin.  Add beta-blocker.    ASSESSMENT:    1. Chest pain, unspecified type   2. Hypertriglyceridemia   3. Controlled type 2 diabetes mellitus without complication, with long-term current use of insulin (HCC)   4. Coronary artery disease involving native coronary artery of native heart without angina pectoris      PLAN:  In order of problems listed above:  1. Chest pain: Plan a trial of medical therapy with reinitiation of metoprolol 12.5 mg twice daily.  I will bring him back in 2 weeks, if he is still symptomatic, I would recommend outpatient cardiac catheterization for definitive evaluation.  Will not attempt stress test as  this patient is quite accurate in the past when it comes to his anginal symptom, therefore if he is symptom, I would favor cath instead of stress test  2. CAD: He has had 3 stents placed in the RCA and one stent in the left circumflex artery  3. Hypertriglyceridemia: Increase Vascepa to 2 g twice a day  4. DM2: Managed by primary care provider.    Medication Adjustments/Labs and Tests Ordered: Current medicines are reviewed at length with the patient today.  Concerns regarding medicines are outlined above.  Medication changes, Labs and Tests ordered today are listed in the Patient Instructions below. Patient Instructions  Medication Instructions:  Restart Metoprolol 12.5 mg twice daily. Increase Vascepa to 2 capsules twice daily.  If you need a refill on your cardiac medications before your next appointment, please call your pharmacy.    Follow-Up: At [facility], you and your health needs are our priority.  As part of our continuing mission to provide you with exceptional heart care, we have created designated Provider Care Teams.  These Care Teams include your primary Cardiologist (physician) and Advanced Practice Providers (APPs -  Physician Assistants and Nurse Practitioners) who all work together to provide you with the care you need, when you need it. . Follow up with Azalee CourseHao Genesys Coggeshall, PA in 2 weeks.  Ramond Dial, [place]  03/22/2018 1:46 PM    [facility] [address] [place], [place], [place]  [address] Phone: [phone]; Fax: [phone]

## 2018-03-22 NOTE — H&P (View-Only) (Signed)
 Cardiology Office Note    Date:  03/22/2018   ID:  Danny [name], DOB 01/11/1966, MRN [id number]  PCP:  Yousef, Deema, MD (Inactive)  Cardiologist: Dr. Hochrein Lipid clinic: Dr. Hilty  Chief Complaint  Patient presents with  . Follow-up    seen for Dr. Hochrein. Chest pain    History of Present Illness:  Danny [name] is a 52 y.o. male with past medical history of CAD, DM 2, hyperlipidemia and obesity.  Patient had a history of PCI to distal RCA in 2006.  She had a severe in-stent restenosis by cardiac catheterization in February 2014 this was treated with a overlapping drug-eluting stent.  Patient had a cardiac catheterization on 01/05/2015 that showed 95% mid left circumflex lesion treated with a drug-eluting stent.  Echo obtained on 5/27 showed EF 65 to 70%, grade 1 DD, mild TR. Last cardiac catheterization obtained on 08/10/2017 showed 95% distal RCA lesion treated with 2.75 x 12 mm drug-eluting stent, EF 55 to 65%.  Patient was seen by Dr. Hochrein in June 2019, at which time he was doing well.  He was referred to Dr. Hilty to manage hyperlipidemia, given his severely elevated triglyceride, Vascepa was added to his medical regimen.  Patient presents today for cardiology office visit.  For the past few weeks, he has been noticing a intermittent burning sensation in his chest.  This is similar to what he has experienced in the past with his anginal symptom.  It mainly occurs with severe exertion but not with every day activity.  For the past 2 weeks, he has experienced 3-4 episodes of this.  The last episode was about a week and a half ago.  He also attributed to high blood sugar during those events as well.  His EKG does not show any ischemic changes.  This is a patient who is quite accurate with his anginal symptom and when he does complain of angina, previous cardiac catheterization has always demonstrated a new blockage.  In this case, I do not recommend any stress test, however I do  recommend to either consider medical management versus cardiac catheterization.  After discussing various options, I recommended to do a trial of medical management first.  If he is still symptomatic in 2 weeks, then I will arrange for outpatient cardiac catheterization.  The patient has been off of his metoprolol for a while as he think the metoprolol was making him more angry than usual.  I recommended restarting metoprolol, we also discussed atenolol and Imdur.  He says he had a reaction with atenolol in the past as well.  He is willing to do another trial on the metoprolol.  His last triglyceride was also severely high, he was placed on Vascepa, however was not taking the full strength of the Vascepa.  Instead he is only taking 1 g twice a day, I recommend him to take 2 g twice a day of his Vascepa.  When he returns in 2 weeks, I will consider when to repeat a fasting lipid panel.   Past Medical History:  Diagnosis Date  . CAD (coronary artery disease)    COSTAR study DES Stent to RCA 2006;   LHC  05/19/12: oLM 20%, LAD 40-50%, pD1 50%, mCFX 30-40%, mRCA stent with severe ISR and 70% beyond the stent, dRCA 50%, pPDA 50%, pPLA 30-40%.  EF 55-65%. =>  PCI:  Overlapping Promus DES x2 to the mid RCA ISR. [place] s/p DES to LCx  c.95% circ DES   10/16,  [place]   . CAD in native artery, hx of multiple PCIs 08/19/2017  . DM2 (diabetes mellitus, type 2) (HCC)   . GERD (gastroesophageal reflux disease)   . HLD (hyperlipidemia)   . Obesity     Past Surgical History:  Procedure Laterality Date  . CARDIAC CATHETERIZATION N/A 01/05/2015   Procedure: Left Heart Cath and Coronary Angiography;  Surgeon: Peter M Jordan, MD;  Location: [facility];  Service: Cardiovascular;  Laterality: N/A;  . CARDIAC CATHETERIZATION  01/05/2015   Procedure: Coronary Stent Intervention;  Surgeon: Peter M Jordan, MD;  Location: [facility];  Service: Cardiovascular;;  . CORONARY ANGIOPLASTY WITH STENT PLACEMENT  05/19/2012     DES   to RCA   by Dr Cooper  . CORONARY STENT INTERVENTION N/A 08/18/2017   Procedure: CORONARY STENT INTERVENTION;  Surgeon: Harding, David W, MD;  Location: [facility];  Service: Cardiovascular;  Laterality: N/A;  . CORONARY STENT PLACEMENT  01/05/2015   mid cx  des  . LEFT HEART CATH AND CORONARY ANGIOGRAPHY N/A 08/18/2017   Procedure: LEFT HEART CATH AND CORONARY ANGIOGRAPHY;  Surgeon: Harding, David W, MD;  Location: [facility];  Service: Cardiovascular;  Laterality: N/A;  . PERCUTANEOUS CORONARY STENT INTERVENTION (PCI-S) N/A 05/19/2012   Procedure: PERCUTANEOUS CORONARY STENT INTERVENTION (PCI-S);  Surgeon: Michael Cooper, MD;  Location: [facility];  Service: Cardiovascular;  Laterality: N/A;    Current Medications: Outpatient Medications Prior to Visit  Medication Sig Dispense Refill  . acetaminophen (TYLENOL) 325 MG tablet Take 2 tablets (650 mg total) by mouth every 4 (four) hours as needed for headache or mild pain.    . aspirin EC 81 MG EC tablet Take 1 tablet (81 mg total) by mouth daily.    . atorvastatin (LIPITOR) 40 MG tablet Take 1 tablet (40 mg total) by mouth daily at 6 PM. 30 tablet 6  . clopidogrel (PLAVIX) 75 MG tablet TAKE 1 TABLET BY MOUTH EVERY DAY 30 tablet 11  . TRESIBA FLEXTOUCH 200 UNIT/ML SOPN Take 40 mg by mouth daily.  5  . TRULICITY 0.75 MG/0.5ML SOPN Inject 1 Dose into the skin once a week.  5  . Icosapent Ethyl 1 g CAPS Take 2 capsules (2 g total) by mouth 2 (two) times daily. (Patient taking differently: Take 1 g by mouth 2 (two) times daily. ) 360 capsule 3  . NITROSTAT 0.4 MG SL tablet PLACE 1 TABLET (0.4 MG TOTAL) UNDER THE TONGUE EVERY 5 (FIVE) MINUTES AS NEEDED FOR CHEST PAIN. 25 tablet 2  . metoprolol tartrate (LOPRESSOR) 25 MG tablet Take 0.5 tablets (12.5 mg total) by mouth 2 (two) times daily. (Patient not taking: Reported on 03/22/2018) 30 tablet 6   No facility-administered medications prior to visit.      Allergies:    Rosuvastatin; Ampicillin; Metformin; and Penicillins   Social History   Socioeconomic History  . Marital status: Married    Spouse name: Not on file  . Number of children: Not on file  . Years of education: Not on file  . Highest education level: Not on file  Occupational History  . Not on file  Social Needs  . Financial resource strain: Not on file  . Food insecurity:    Worry: Not on file    Inability: Not on file  . Transportation needs:    Medical: Not on file    Non-medical: Not on file  Tobacco Use  . Smoking   status: Never Smoker  . Smokeless tobacco: Never Used  Substance and Sexual Activity  . Alcohol use: Yes    Comment: rare  . Drug use: No  . Sexual activity: Not on file  Lifestyle  . Physical activity:    Days per week: Not on file    Minutes per session: Not on file  . Stress: Not on file  Relationships  . Social connections:    Talks on phone: Not on file    Gets together: Not on file    Attends religious service: Not on file    Active member of club or organization: Not on file    Attends meetings of clubs or organizations: Not on file    Relationship status: Not on file  Other Topics Concern  . Not on file  Social History Narrative  . Not on file     Family History:  The patient's family history includes Coronary artery disease (age of onset: 59) in his father; Diabetes in his mother; Heart attack in his father.   ROS:   Please see the history of present illness.    ROS All other systems reviewed and are negative.   PHYSICAL EXAM:   VS:  BP 106/60   Pulse 69   Ht 5' 5" (1.651 m)   Wt 174 lb 12.8 oz (79.3 kg)   SpO2 98%   BMI 29.09 kg/m    GEN: Well nourished, well developed, in no acute distress  HEENT: normal  Neck: no JVD, carotid bruits, or masses Cardiac: RRR; no murmurs, rubs, or gallops,no edema  Respiratory:  clear to auscultation bilaterally, normal work of breathing GI: soft, nontender, nondistended, + BS MS: no deformity or  atrophy  Skin: warm and dry, no rash Neuro:  Alert and Oriented x 3, Strength and sensation are intact Psych: euthymic mood, full affect  Wt Readings from Last 3 Encounters:  03/22/18 174 lb 12.8 oz (79.3 kg)  10/22/17 179 lb 3.2 oz (81.3 kg)  08/25/17 178 lb (80.7 kg)      Studies/Labs Reviewed:   EKG:  EKG is ordered today.  The ekg ordered today demonstrates normal sinus rhythm without significant ST-T wave changes  Recent Labs: 08/19/2017: BUN 13; Creatinine, Ser 0.85; Hemoglobin 14.0; Platelets 225; Potassium 4.3; Sodium 136   Lipid Panel    Component Value Date/Time   CHOL 163 08/16/2017 0320   TRIG 619 (H) 08/16/2017 0320   HDL 22 (L) 08/16/2017 0320   CHOLHDL 7.4 08/16/2017 0320   VLDL UNABLE TO CALCULATE IF TRIGLYCERIDE OVER 400 mg/dL 08/16/2017 0320   LDLCALC UNABLE TO CALCULATE IF TRIGLYCERIDE OVER 400 mg/dL 08/16/2017 0320   LDLDIRECT 57.7 08/25/2012 0951    Additional studies/ records that were reviewed today include:   Echo 08/17/2017 LV EF: 65% -   70% Study Conclusions  - Left ventricle: The cavity size was normal. Systolic function was   vigorous. The estimated ejection fraction was in the range of 65%   to 70%. Wall motion was normal; there were no regional wall   motion abnormalities. Doppler parameters are consistent with   abnormal left ventricular relaxation (grade 1 diastolic   dysfunction). There was no evidence of elevated ventricular   filling pressure by Doppler parameters. - Aortic valve: Transvalvular velocity was minimally increased.   There was no stenosis. Valve area (VTI): 2.23 cm^2. Valve area   (Vmax): 2.06 cm^2. Valve area (Vmean): 2.22 cm^2. - Aortic root: The aortic root was normal in size. -   Mitral valve: There was no regurgitation. - Right ventricle: Systolic function was normal. - Tricuspid valve: There was mild regurgitation. - Pulmonary arteries: Systolic pressure was within the normal   range. - Inferior vena cava: The  vessel was normal in size. - Pericardium, extracardiac: There was no pericardial effusion.   Cath 08/18/2017  Prox Cx lesion is 30% stenosed.  Ost LAD to Mid LAD lesion is 25% stenosed.  Previously placed Prox Cx to Mid Cx stent (DES) is widely patent.  Ost 1st Diag lesion is 50% stenosed.  Prox RCA - proximal stent is 50% stenosed.  Prox RCA to Mid RCA stent is 30% stenosed.  Dist RCA-1 lesion is 30% stenosed. Post Atrio lesion is 60% stenosed.  _______________________  CULPRIT LESION: Dist RCA-2 lesion is 95% stenosed.  A drug-eluting stent was successfully placed using a STENT SYNERGY DES 2.75X12.  Post intervention, there is a 0% residual stenosis.  __________________________________  The left ventricular systolic function is normal.  LV end diastolic pressure is normal.  The left ventricular ejection fraction is 55-65% by visual estimate.  There is no aortic valve stenosis.  There is no mitral valve stenosis and no mitral valve prolapse evident.   Severe dRCA stenosis (progressive of disease) - s/p Successful DES PCI - 2.75 x 12 (2.9 mm). Moderate proximal to mid RCA in-stent restenosis. Moderate distal RPL branch disease.  Plan: Transfer to [facility] post procedure unit for post PCI care. Continue aspirin Plavix. Continue statin.  Add beta-blocker.    ASSESSMENT:    1. Chest pain, unspecified type   2. Hypertriglyceridemia   3. Controlled type 2 diabetes mellitus without complication, with long-term current use of insulin (HCC)   4. Coronary artery disease involving native coronary artery of native heart without angina pectoris      PLAN:  In order of problems listed above:  1. Chest pain: Plan a trial of medical therapy with reinitiation of metoprolol 12.5 mg twice daily.  I will bring him back in 2 weeks, if he is still symptomatic, I would recommend outpatient cardiac catheterization for definitive evaluation.  Will not attempt stress test as  this patient is quite accurate in the past when it comes to his anginal symptom, therefore if he is symptom, I would favor cath instead of stress test  2. CAD: He has had 3 stents placed in the RCA and one stent in the left circumflex artery  3. Hypertriglyceridemia: Increase Vascepa to 2 g twice a day  4. DM2: Managed by primary care provider.    Medication Adjustments/Labs and Tests Ordered: Current medicines are reviewed at length with the patient today.  Concerns regarding medicines are outlined above.  Medication changes, Labs and Tests ordered today are listed in the Patient Instructions below. Patient Instructions  Medication Instructions:  Restart Metoprolol 12.5 mg twice daily. Increase Vascepa to 2 capsules twice daily.  If you need a refill on your cardiac medications before your next appointment, please call your pharmacy.    Follow-Up: At [facility], you and your health needs are our priority.  As part of our continuing mission to provide you with exceptional heart care, we have created designated Provider Care Teams.  These Care Teams include your primary Cardiologist (physician) and Advanced Practice Providers (APPs -  Physician Assistants and Nurse Practitioners) who all work together to provide you with the care you need, when you need it. . Follow up with Holton Sidman, PA in 2 weeks.          Signed, Amarah Brossman, PA  03/22/2018 1:46 PM    [facility] [address], [place], [place]  [address] Phone: [phone]; Fax: [phone]   

## 2018-03-30 ENCOUNTER — Other Ambulatory Visit: Payer: Self-pay

## 2018-03-30 ENCOUNTER — Telehealth: Payer: Self-pay | Admitting: Physician Assistant

## 2018-03-30 ENCOUNTER — Other Ambulatory Visit: Payer: Self-pay | Admitting: Physician Assistant

## 2018-03-30 DIAGNOSIS — R079 Chest pain, unspecified: Secondary | ICD-10-CM

## 2018-03-30 NOTE — Progress Notes (Signed)
FOR UPCOMING CATH PROCEDURE

## 2018-03-30 NOTE — Telephone Encounter (Signed)
I have called Mr. Jayquan, despite the addition of beta blocker, his exertional chest pain still persist. I discussed the case with Dr. Antoine Poche, we recommend he proceed with cardiac catheterization. I discussed with Mr Gerhardt who was agreeable to proceed.  Risk and benefit of procedure explained to the patient who display clear understanding and agree to proceed.  Discussed with patient possible procedural risk include bleeding, vascular injury, renal injury, arrythmia, MI, stroke and loss of limb or life.  His cardiac catheterization is scheduled for Friday 04/02/2017 at 9 AM with Dr. [place]. He is aware he will need to arrive 2 hours early (by 7AM) on the day of the procedure.   Ramond Dial PA Pager: [phone]

## 2018-03-30 NOTE — Telephone Encounter (Signed)
Follow up    Pt is wanting a follow up phone call from previous questions he had yesterday. Please call

## 2018-03-31 DIAGNOSIS — R079 Chest pain, unspecified: Secondary | ICD-10-CM | POA: Diagnosis not present

## 2018-04-01 ENCOUNTER — Telehealth: Payer: Self-pay | Admitting: *Deleted

## 2018-04-01 LAB — BASIC METABOLIC PANEL
BUN/Creatinine Ratio: 13 (ref 9–20)
BUN: 10 mg/dL (ref 6–24)
CHLORIDE: 97 mmol/L (ref 96–106)
CO2: 25 mmol/L (ref 20–29)
Calcium: 9.9 mg/dL (ref 8.7–10.2)
Creatinine, Ser: 0.79 mg/dL (ref 0.76–1.27)
GFR calc Af Amer: 119 mL/min/{1.73_m2} (ref >59)
GFR calc non Af Amer: 103 mL/min/{1.73_m2} (ref >59)
Glucose: 171 mg/dL — ABNORMAL HIGH (ref 65–99)
POTASSIUM: 4.9 mmol/L (ref 3.5–5.2)
Sodium: 137 mmol/L (ref 134–144)

## 2018-04-01 LAB — CBC
HEMOGLOBIN: 14.2 g/dL (ref 13.0–17.7)
Hematocrit: 40.5 % (ref 37.5–51.0)
MCH: 30.6 pg (ref 26.6–33.0)
MCHC: 35.1 g/dL (ref 31.5–35.7)
MCV: 87 fL (ref 79–97)
Platelets: 249 10*3/uL (ref 150–450)
RBC: 4.64 x10E6/uL (ref 4.14–5.80)
RDW: 12.3 % (ref 11.6–15.4)
WBC: 6.8 10*3/uL (ref 3.4–10.8)

## 2018-04-01 NOTE — Telephone Encounter (Signed)
Pt contacted pre-catheterization scheduled at [facility] for: Friday April 02, 2018 9 AM Verified arrival time and place: [facility] Main Entrance A at: 7 AM  No solid food after midnight prior to cath, clear liquids until 5 AM day of procedure. Contrast allergy: no  Hold: [place]-AM of procedure.  Except hold medications AM meds can be  taken pre-cath with sip of water including: ASA 81 mg Clopidogrel 75 mg  Confirm patient has responsible person to drive home post procedure and for 24 hours after you arrive home.  LMTCB to review instructions with patient.

## 2018-04-01 NOTE — Progress Notes (Signed)
Renal function and electrolyte ok. Red cell count normal.

## 2018-04-01 NOTE — Telephone Encounter (Signed)
I reviewed instructions with patient, he verbalized understanding,thanked me for call. 

## 2018-04-02 ENCOUNTER — Ambulatory Visit (HOSPITAL_COMMUNITY)
Admission: RE | Admit: 2018-04-02 | Discharge: 2018-04-02 | Disposition: A | Discharge status: Home / Self Care | Payer: BLUE CROSS/BLUE SHIELD | Attending: Cardiology | Admitting: Cardiology

## 2018-04-02 ENCOUNTER — Other Ambulatory Visit: Payer: Self-pay | Admitting: Cardiology

## 2018-04-02 ENCOUNTER — Encounter (HOSPITAL_COMMUNITY)
Admission: RE | Disposition: A | Discharge status: Home / Self Care | Payer: Self-pay | Source: Home / Self Care | Attending: Cardiology

## 2018-04-02 DIAGNOSIS — Z8249 Family history of ischemic heart disease and other diseases of the circulatory system: Secondary | ICD-10-CM | POA: Insufficient documentation

## 2018-04-02 DIAGNOSIS — I200 Unstable angina: Secondary | ICD-10-CM

## 2018-04-02 DIAGNOSIS — E781 Pure hyperglyceridemia: Secondary | ICD-10-CM | POA: Diagnosis not present

## 2018-04-02 DIAGNOSIS — E785 Hyperlipidemia, unspecified: Secondary | ICD-10-CM | POA: Diagnosis present

## 2018-04-02 DIAGNOSIS — Z7902 Long term (current) use of antithrombotics/antiplatelets: Secondary | ICD-10-CM | POA: Diagnosis not present

## 2018-04-02 DIAGNOSIS — Z6829 Body mass index (BMI) 29.0-29.9, adult: Secondary | ICD-10-CM | POA: Diagnosis not present

## 2018-04-02 DIAGNOSIS — Z7982 Long term (current) use of aspirin: Secondary | ICD-10-CM | POA: Insufficient documentation

## 2018-04-02 DIAGNOSIS — Z955 Presence of coronary angioplasty implant and graft: Secondary | ICD-10-CM | POA: Diagnosis not present

## 2018-04-02 DIAGNOSIS — I25110 Atherosclerotic heart disease of native coronary artery with unstable angina pectoris: Secondary | ICD-10-CM

## 2018-04-02 DIAGNOSIS — Z9861 Coronary angioplasty status: Secondary | ICD-10-CM

## 2018-04-02 DIAGNOSIS — Z79899 Other long term (current) drug therapy: Secondary | ICD-10-CM | POA: Diagnosis not present

## 2018-04-02 DIAGNOSIS — Z794 Long term (current) use of insulin: Secondary | ICD-10-CM | POA: Diagnosis not present

## 2018-04-02 DIAGNOSIS — I2510 Atherosclerotic heart disease of native coronary artery without angina pectoris: Secondary | ICD-10-CM

## 2018-04-02 DIAGNOSIS — E1165 Type 2 diabetes mellitus with hyperglycemia: Secondary | ICD-10-CM | POA: Diagnosis not present

## 2018-04-02 DIAGNOSIS — Z880 Allergy status to penicillin: Secondary | ICD-10-CM | POA: Diagnosis not present

## 2018-04-02 DIAGNOSIS — E118 Type 2 diabetes mellitus with unspecified complications: Secondary | ICD-10-CM

## 2018-04-02 DIAGNOSIS — Z888 Allergy status to other drugs, medicaments and biological substances status: Secondary | ICD-10-CM | POA: Insufficient documentation

## 2018-04-02 DIAGNOSIS — Y831 Surgical operation with implant of artificial internal device as the cause of abnormal reaction of the patient, or of later complication, without mention of misadventure at the time of the procedure: Secondary | ICD-10-CM | POA: Insufficient documentation

## 2018-04-02 DIAGNOSIS — I493 Ventricular premature depolarization: Secondary | ICD-10-CM | POA: Diagnosis not present

## 2018-04-02 DIAGNOSIS — E669 Obesity, unspecified: Secondary | ICD-10-CM | POA: Diagnosis not present

## 2018-04-02 DIAGNOSIS — K219 Gastro-esophageal reflux disease without esophagitis: Secondary | ICD-10-CM | POA: Diagnosis not present

## 2018-04-02 DIAGNOSIS — R0600 Dyspnea, unspecified: Secondary | ICD-10-CM

## 2018-04-02 DIAGNOSIS — Z833 Family history of diabetes mellitus: Secondary | ICD-10-CM | POA: Insufficient documentation

## 2018-04-02 DIAGNOSIS — T82855A Stenosis of coronary artery stent, initial encounter: Secondary | ICD-10-CM | POA: Insufficient documentation

## 2018-04-02 HISTORY — PX: LEFT HEART CATH AND CORONARY ANGIOGRAPHY: CATH118249

## 2018-04-02 HISTORY — PX: CORONARY BALLOON ANGIOPLASTY: CATH118233

## 2018-04-02 HISTORY — PX: INTRAVASCULAR ULTRASOUND/IVUS: CATH118244

## 2018-04-02 LAB — GLUCOSE, CAPILLARY
Glucose-Capillary: 134 mg/dL — ABNORMAL HIGH (ref 70–99)
Glucose-Capillary: 104 mg/dL — ABNORMAL HIGH (ref 70–99)

## 2018-04-02 LAB — POCT ACTIVATED CLOTTING TIME: Activated Clotting Time: 351 seconds

## 2018-04-02 SURGERY — LEFT HEART CATH AND CORONARY ANGIOGRAPHY
Anesthesia: LOCAL

## 2018-04-02 MED ORDER — NITROGLYCERIN 1 MG/10 ML FOR IR/CATH LAB
INTRA_ARTERIAL | Status: DC | PRN
  Administered 2018-04-02: 200 ug via INTRACORONARY

## 2018-04-02 MED ORDER — HEPARIN (PORCINE) IN NACL 1000-0.9 UT/500ML-% IV SOLN
INTRAVENOUS | Status: AC
  Filled 2018-04-02: qty 1000

## 2018-04-02 MED ORDER — FENTANYL CITRATE (PF) 100 MCG/2ML IJ SOLN
INTRAMUSCULAR | Status: AC
  Filled 2018-04-02: qty 2

## 2018-04-02 MED ORDER — ONDANSETRON HCL 4 MG/2ML IJ SOLN: 4.0000 mg | Freq: Four times a day (QID) | INTRAMUSCULAR | Status: DC | PRN

## 2018-04-02 MED ORDER — CLOPIDOGREL BISULFATE 75 MG PO TABS: 75.0000 mg | ORAL_TABLET | Freq: Every day | ORAL | 11 refills | Status: DC

## 2018-04-02 MED ORDER — ASPIRIN 81 MG PO CHEW: 81.0000 mg | CHEWABLE_TABLET | ORAL | Status: DC

## 2018-04-02 MED ORDER — NITROGLYCERIN 0.4 MG SL SUBL: 0.4000 mg | SUBLINGUAL_TABLET | SUBLINGUAL | Status: DC | PRN

## 2018-04-02 MED ORDER — SODIUM CHLORIDE 0.9 % WEIGHT BASED INFUSION: 1.0000 mL/kg/h | INTRAVENOUS | Status: DC

## 2018-04-02 MED ORDER — FENTANYL CITRATE (PF) 100 MCG/2ML IJ SOLN
INTRAMUSCULAR | Status: DC | PRN
  Administered 2018-04-02: 25 ug via INTRAVENOUS

## 2018-04-02 MED ORDER — SODIUM CHLORIDE 0.9 % IV SOLN: 250.0000 mL | INTRAVENOUS | Status: DC | PRN

## 2018-04-02 MED ORDER — HEPARIN SODIUM (PORCINE) 1000 UNIT/ML IJ SOLN
INTRAMUSCULAR | Status: DC | PRN
  Administered 2018-04-02 (×2): 4000 [IU] via INTRAVENOUS

## 2018-04-02 MED ORDER — INSULIN DEGLUDEC 200 UNIT/ML ~~LOC~~ SOPN: 40.0000 [IU] | PEN_INJECTOR | Freq: Every day | SUBCUTANEOUS | Status: DC

## 2018-04-02 MED ORDER — LIDOCAINE HCL (PF) 1 % IJ SOLN
INTRAMUSCULAR | Status: AC
  Filled 2018-04-02: qty 30

## 2018-04-02 MED ORDER — ICOSAPENT ETHYL 1 G PO CAPS: 2.0000 g | ORAL_CAPSULE | Freq: Two times a day (BID) | ORAL | Status: DC

## 2018-04-02 MED ORDER — SODIUM CHLORIDE 0.9% FLUSH: 3.0000 mL | Freq: Two times a day (BID) | INTRAVENOUS | Status: DC

## 2018-04-02 MED ORDER — CLOPIDOGREL BISULFATE 75 MG PO TABS: 75.0000 mg | ORAL_TABLET | Freq: Every day | ORAL | Status: DC

## 2018-04-02 MED ORDER — VERAPAMIL HCL 2.5 MG/ML IV SOLN
INTRAVENOUS | Status: DC | PRN
  Administered 2018-04-02: 10 mL via INTRA_ARTERIAL

## 2018-04-02 MED ORDER — SODIUM CHLORIDE 0.9 % WEIGHT BASED INFUSION
3.0000 mL/kg/h | INTRAVENOUS | Status: AC
  Administered 2018-04-02: 3 mL/kg/h via INTRAVENOUS

## 2018-04-02 MED ORDER — ACETAMINOPHEN 325 MG PO TABS: 650.0000 mg | ORAL_TABLET | ORAL | Status: DC | PRN

## 2018-04-02 MED ORDER — HEPARIN SODIUM (PORCINE) 1000 UNIT/ML IJ SOLN
INTRAMUSCULAR | Status: AC
  Filled 2018-04-02: qty 1

## 2018-04-02 MED ORDER — IOHEXOL 350 MG/ML SOLN
INTRAVENOUS | Status: DC | PRN
  Administered 2018-04-02: 140 mL via INTRAVENOUS

## 2018-04-02 MED ORDER — SODIUM CHLORIDE 0.9% FLUSH: 3.0000 mL | INTRAVENOUS | Status: DC | PRN

## 2018-04-02 MED ORDER — SODIUM CHLORIDE 0.9 % WEIGHT BASED INFUSION: 1.0000 mL/kg/h | INTRAVENOUS | Status: AC

## 2018-04-02 MED ORDER — MIDAZOLAM HCL 2 MG/2ML IJ SOLN
INTRAMUSCULAR | Status: DC | PRN
  Administered 2018-04-02: 1 mg via INTRAVENOUS

## 2018-04-02 MED ORDER — NITROGLYCERIN 1 MG/10 ML FOR IR/CATH LAB
INTRA_ARTERIAL | Status: AC
  Filled 2018-04-02: qty 10

## 2018-04-02 MED ORDER — VERAPAMIL HCL 2.5 MG/ML IV SOLN
INTRAVENOUS | Status: AC
  Filled 2018-04-02: qty 2

## 2018-04-02 MED ORDER — ASPIRIN 81 MG PO TBEC: 81.0000 mg | DELAYED_RELEASE_TABLET | Freq: Every day | ORAL | Status: DC

## 2018-04-02 MED ORDER — MIDAZOLAM HCL 2 MG/2ML IJ SOLN
INTRAMUSCULAR | Status: AC
  Filled 2018-04-02: qty 2

## 2018-04-02 MED ORDER — ATORVASTATIN CALCIUM 80 MG PO TABS: 80.0000 mg | ORAL_TABLET | Freq: Every day | ORAL | 4 refills | Status: DC

## 2018-04-02 MED ORDER — HEPARIN (PORCINE) IN NACL 1000-0.9 UT/500ML-% IV SOLN
INTRAVENOUS | Status: DC | PRN
  Administered 2018-04-02 (×2): 500 mL

## 2018-04-02 MED ORDER — LIDOCAINE HCL (PF) 1 % IJ SOLN
INTRAMUSCULAR | Status: DC | PRN
  Administered 2018-04-02: 2 mL

## 2018-04-02 MED ORDER — DULAGLUTIDE 1.5 MG/0.5ML ~~LOC~~ SOAJ: 1.5000 mg | SUBCUTANEOUS | Status: DC

## 2018-04-02 MED ORDER — ATORVASTATIN CALCIUM 80 MG PO TABS: 80.0000 mg | ORAL_TABLET | Freq: Every day | ORAL | Status: DC

## 2018-04-02 SURGICAL SUPPLY — 15 items
BALLN SAPPHIRE ~~LOC~~ 3.25X10 (BALLOONS) ×1 IMPLANT
CATH 5FR JL3.5 JR4 ANG PIG MP (CATHETERS) ×1 IMPLANT
CATH DRAGONFLY OPTIS 2.7FR (CATHETERS) ×1 IMPLANT
CATH VISTA GUIDE 6FR AL1 (CATHETERS) ×1 IMPLANT
DEVICE RAD COMP TR BAND LRG (VASCULAR PRODUCTS) ×1 IMPLANT
GLIDESHEATH SLEND SS 6F .021 (SHEATH) ×1 IMPLANT
GUIDEWIRE INQWIRE 1.5J.035X260 (WIRE) IMPLANT
INQWIRE 1.5J .035X260CM (WIRE) ×2
KIT ENCORE 26 ADVANTAGE (KITS) ×1 IMPLANT
KIT HEART LEFT (KITS) ×2 IMPLANT
PACK CARDIAC CATHETERIZATION (CUSTOM PROCEDURE TRAY) ×2 IMPLANT
SYR MEDRAD MARK 7 150ML (SYRINGE) ×2 IMPLANT
TRANSDUCER W/STOPCOCK (MISCELLANEOUS) ×2 IMPLANT
TUBING CIL FLEX 10 FLL-RA (TUBING) ×2 IMPLANT
WIRE ASAHI PROWATER 180CM (WIRE) ×1 IMPLANT

## 2018-04-02 NOTE — Progress Notes (Signed)
We were called to assess Danny [name] in post cath recovery short stay for shortness of breath.  I presented to the bedside immediately.  On arrival his saturations were 100% on supplemental oxygen, blood pressure systolic 125 mmHg, and heart rate 60 to 70 bpm.  He was sitting comfortably in the chair, and was in no acute distress.  He tells me that at the end of each expiration he felt more short of breath.  He has had this sensation before, after a procedure but could not explain what caused it.  He has had food and 3 cups of coffee today and when his symptoms started he also started having intermittent PVC on telemetry.  He had an angiogram today with balloon angioplasty.  No immediate complications from the procedure.  I performed a limited bedside echocardiogram to evaluate for ventricular function and pericardial effusion.  Grossly normal biventricular function, no pericardial effusion was identified.  Imaging was performed from the left parasternal, subcostal, and apical windows.  His exam was unremarkable.  Cardiovascular: RRR, no murmurs lungs: Clear, abdomen: Soft, nontender, extremities: Warm, well-perfused, no edema.  After review of all of his symptoms, telemetry, and examination with limited bedside echocardiogram, his symptom of mild shortness of breath on expiration may be related to PVCs.  He has had several cups of coffee today which he is not used to doing.  I have asked for short stay to monitor the patient for an hour to longer than they had planned.  I have encouraged him to contact me with any concerns if the patient appears that he is not doing well.  I have discussed this with the patient and his wife.  We have participated in shared decision making regarding his disposition.  I have answered their questions to the best my ability.  We were in agreement at the end of our discussion.  This was also communicated to the patient's nurse.

## 2018-04-02 NOTE — Discharge Instructions (Addendum)
Radial Site Care ° °This sheet gives you information about how to care for yourself after your procedure. Your health care provider may also give you more specific instructions. If you have problems or questions, contact your health care provider. °What can I expect after the procedure? °After the procedure, it is common to have: °· Bruising and tenderness at the catheter insertion area. °Follow these instructions at home: °Medicines °· Take over-the-counter and prescription medicines only as told by your health care provider. °Insertion site care °· Follow instructions from your health care provider about how to take care of your insertion site. Make sure you: °? Wash your hands with soap and water before you change your bandage (dressing). If soap and water are not available, use hand sanitizer. °? Change your dressing as told by your health care provider. °? Leave stitches (sutures), skin glue, or adhesive strips in place. These skin closures may need to stay in place for 2 weeks or longer. If adhesive strip edges start to loosen and curl up, you may trim the loose edges. Do not remove adhesive strips completely unless your health care provider tells you to do that. °· Check your insertion site every day for signs of infection. Check for: °? Redness, swelling, or pain. °? Fluid or blood. °? Pus or a bad smell. °? Warmth. °· Do not take baths, swim, or use a hot tub until your health care provider approves. °· You may shower 24-48 hours after the procedure, or as directed by your health care provider. °? Remove the dressing and gently wash the site with plain soap and water. °? Pat the area dry with a clean towel. °? Do not rub the site. That could cause bleeding. °· Do not apply powder or lotion to the site. °Activity ° °· For 24 hours after the procedure, or as directed by your health care provider: °? Do not flex or bend the affected arm. °? Do not push or pull heavy objects with the affected arm. °? Do not  drive yourself home from the hospital or clinic. You may drive 24 hours after the procedure unless your health care provider tells you not to. °? Do not operate machinery or power tools. °· Do not lift anything that is heavier than 10 lb (4.5 kg), or the limit that you are told, until your health care provider says that it is safe. °· Ask your health care provider when it is okay to: °? Return to work or school. °? Resume usual physical activities or sports. °? Resume sexual activity. °General instructions °· If the catheter site starts to bleed, raise your arm and put firm pressure on the site. If the bleeding does not stop, get help right away. This is a medical emergency. °· If you went home on the same day as your procedure, a responsible adult should be with you for the first 24 hours after you arrive home. °· Keep all follow-up visits as told by your health care provider. This is important. °Contact a health care provider if: °· You have a fever. °· You have redness, swelling, or yellow drainage around your insertion site. °Get help right away if: °· You have unusual pain at the radial site. °· The catheter insertion area swells very fast. °· The insertion area is bleeding, and the bleeding does not stop when you hold steady pressure on the area. °· Your arm or hand becomes pale, cool, tingly, or numb. °These symptoms may represent a serious problem   that is an emergency. Do not wait to see if the symptoms will go away. Get medical help right away. Call your local emergency services (911 in the [place] Do not drive yourself to the hospital. Summary  After the procedure, it is common to have bruising and tenderness at the site.  Follow instructions from your health care provider about how to take care of your radial site wound. Check the wound every day for signs of infection.  Do not lift anything that is heavier than 10 lb (4.5 kg), or the limit that you are told, until your health care provider says  that it is safe. This information is not intended to replace advice given to you by your health care provider. Make sure you discuss any questions you have with your health care provider. Document Released: 04/12/2010 Document Revised: 04/15/2017 Document Reviewed: 04/15/2017 Elsevier Interactive Patient Education  2019 [employer]    Moderate Conscious Sedation, Adult, Care After These instructions provide you with information about caring for yourself after your procedure. Your health care provider may also give you more specific instructions. Your treatment has been planned according to current medical practices, but problems sometimes occur. Call your health care provider if you have any problems or questions after your procedure. What can I expect after the procedure? After your procedure, it is common:  To feel sleepy for several hours.  To feel clumsy and have poor balance for several hours.  To have poor judgment for several hours.  To vomit if you eat too soon. Follow these instructions at home: For at least 24 hours after the procedure:   Do not: ? Participate in activities where you could fall or become injured. ? Drive. ? Use heavy machinery. ? Drink alcohol. ? Take sleeping pills or medicines that cause drowsiness. ? Make important decisions or sign legal documents. ? Take care of children on your own.  Rest. Eating and drinking  Follow the diet recommended by your health care provider.  If you vomit: ? Drink water, juice, or soup when you can drink without vomiting. ? Make sure you have little or no nausea before eating solid foods. General instructions  Have a responsible adult stay with you until you are awake and alert.  Take over-the-counter and prescription medicines only as told by your health care provider.  If you smoke, do not smoke without supervision.  Keep all follow-up visits as told by your health care provider. This is  important. Contact a health care provider if:  You keep feeling nauseous or you keep vomiting.  You feel light-headed.  You develop a rash.  You have a fever. Get help right away if:  You have trouble breathing. This information is not intended to replace advice given to you by your health care provider. Make sure you discuss any questions you have with your health care provider. Document Released: 12/29/2012 Document Revised: 08/13/2015 Document Reviewed: 06/30/2015 Elsevier Interactive Patient Education  2019 [employer]    Information about your medication: Plavix (anti-platelet agent)  Generic Name (Brand): clopidogrel (Plavix), once daily medication  PURPOSE: You are taking this medication along with aspirin to lower your chance of having a heart attack, stroke, or blood clots in your heart stent. These can be fatal. Brilinta and aspirin help prevent platelets from sticking together and forming a clot that can block an artery or your stent.   Common SIDE EFFECTS you may experience include: bruising or bleeding more easily, shortness of breath  Do not stop taking PLAVIX without talking to the doctor who prescribes it for you. People who are treated with a stent and stop taking Plavix too soon, have a higher risk of getting a blood clot in the stent, having a heart attack, or dying. If you stop Plavix because of bleeding, or for other reasons, your risk of a heart attack or stroke may increase.   Tell all of your doctors and dentists that you are taking Plavix. They should talk to the doctor who prescribed plavix for you before you have any surgery or invasive procedure.   Contact your health care provider if you experience: severe or uncontrollable bleeding, pink/red/brown urine, vomiting blood or vomit that looks like "coffee grounds", red or black stools (looks like tar), coughing up blood or blood  clots ----------------------------------------------------------------------------------------------------------------------

## 2018-04-02 NOTE — Discharge Summary (Signed)
Discharge Summary    Patient ID: Danny [name] MRN: [id number]; DOB: June 18, 1965  Admit date: 04/02/2018 Discharge date: 04/02/2018  Primary Care Provider: Hoyt Koch, MD (Inactive)  Primary Cardiologist: [name], MD   Discharge Diagnoses    Principal Problem:   Unstable angina [facility]) Active Problems:   Dyslipidemia   Hypertriglyceridemia   Type 2 diabetes mellitus with complication, with long-term current use of insulin (HCC)   CAD S/P percutaneous coronary angioplasty  Allergies Allergies  Allergen Reactions  . Rosuvastatin     MUSCLE PAIN  . Ampicillin Nausea And Vomiting    DID THE REACTION INVOLVE: Swelling of the face/tongue/throat, SOB, or low BP? No Sudden or severe rash/hives, skin peeling, or the inside of the mouth or nose? No Did it require medical treatment? No When did it last happen?childhood allergy  If all above answers are "NO", may proceed with cephalosporin use.   . Metformin Diarrhea  . Penicillins     DID THE REACTION INVOLVE: Swelling of the face/tongue/throat, SOB, or low BP? Unknown Sudden or severe rash/hives, skin peeling, or the inside of the mouth or nose? Unknown Did it require medical treatment? Unknown When did it last happen?childhood allergy If all above answers are "NO", may proceed with cephalosporin use.    Diagnostic Studies/Procedures    Cardiac catheterization 04/02/2018:   Ost 1st Diag lesion is 50% stenosed.  Prox Cx lesion is 30% stenosed.  Non-stenotic Prox Cx to Mid Cx lesion was previously treated.  Prox RCA to Mid RCA lesion is 30% stenosed.  Prox RCA lesion is 50% stenosed.  Dist RCA-1 lesion is 30% stenosed.  Post Atrio lesion is 60% stenosed.  Balloon angioplasty was performed.  Dist RCA-2 lesion is 95% stenosed.  Mid LAD lesion is 75% stenosed.  Ost LAD to Prox LAD lesion is 40% stenosed.  Mid Cx to Dist Cx lesion is 40% stenosed.  Post intervention, there is a 0% residual  stenosis.  Balloon angioplasty was performed using a BALLOON SAPPHIRE [place] 3.25X10.  The left ventricular systolic function is normal.  LV end diastolic pressure is normal.  The left ventricular ejection fraction is 55-65% by visual estimate.   1. Patient has moderate diffuse 3 vessel CAD. His culprit lesion is in the distal RCA with in stent restenosis. By OCT this is secondary to underexpansion of the stent with intimal hyperplasia.  2. Normal LV function 3. Normal LVEDP 4. Successful PCI with balloon angioplasty of the distal RCA for in stent restenosis.  Plan: continue DAPT indefinitely. Will increase lipitor to 80 mg daily. Risk factor modification. Reviewing prior cath films he does have progressive disease in all coronaries compared to 2006. If he has recurrent stenosis may need ultimately to consider for CABG with history of DM.  History of Present Illness     Danny [name] is a 53 y.o. male with past medical history of CAD, DM 2, hyperlipidemia and obesity.  Patient had a history of PCI to distal RCA in 2006.  She had a severe in-stent restenosis by cardiac catheterization in February 2014 this was treated with a overlapping drug-eluting stent.  Patient had a cardiac catheterization on 01/05/2015 that showed 95% mid left circumflex lesion treated with a drug-eluting stent.  Echo obtained on 5/27 showed EF 65 to 70%, grade 1 DD, mild TR. Last cardiac catheterization obtained on 08/10/2017 showed 95% distal RCA lesion treated with 2.75 x 12 mm drug-eluting stent, EF 55 to 65%.  Patient was seen by  Dr. Antoine Poche in June 2019, at which time he was doing well.  He was referred to Dr. Rennis Golden to manage hyperlipidemia, given his severely elevated triglyceride, Vascepa was added to his medical regimen.  Was seen 03/22/18 and had been noticing intermittent burning sensation in his chest, similar to what he had experienced in the past with his anginal symptom. He reported that it mainly occurred with  severe exertion but not with every day activity. For the past 2 weeks prior admission, he had experienced 3-4 episodes. EKG with no ischemic changes in the office. He reported that in the past with recurrent episodes cardiac catheterization has always demonstrated a new blockage. Initially medical management was recommended however is he continued to be symptomatic, cardiac catheterization for definitive evaluation.   Per telephone encounter, his chest pain was re-evaluated. Case discussed with Dr. Antoine Poche and plans were made to proceed with cath.   Hospital Course   Pt underwent cardiac catheterization on 04/02/2018 which showed moderate diffuse 3 vessel CAD. Culprit lesion was found to be distal  RCA with in stent restenosis. By OCT this is secondary tot under expansion of the stent with intimal hyperplasia. He had normal LV function and normal LVEDP. A successful PCI with balloon angioplasty of the distal RCA for in stent restenosis was performed. Recommendations are for continue DAPT indefinitely. Lipitor was increased to 80 mg daily. Continue with aggressive risk factor modification. Per cath report, he has progressive disease in all coronaries compared to 2006. If he has recurrent stenosis, may need ultimately to consider for CABG with history of DM.  Medication changes: -Increase atorvastatin to 80mg  daily   Consultants: None   The patient has been seen and examined by Dr. Swaziland who feels that he is stable and ready for discharge. Cath site unremarkable. Pt has follow up appointment scheduled. _____________  Discharge Vitals Blood pressure 112/70, pulse 66, temperature 97.9 F (36.6 C), temperature source Oral, resp. rate 13, height 5\' 5"  (1.651 m), weight 76.2 kg, SpO2 99 %.  Filed Weights   04/02/18 0707  Weight: 76.2 kg   Labs & Radiologic Studies    CBC Recent Labs    03/31/18 0816  WBC 6.8  HGB 14.2  HCT 40.5  MCV 87  PLT 249   Basic Metabolic Panel Recent Labs     03/31/18 0816  NA 137  K 4.9  CL 97  CO2 25  GLUCOSE 171*  BUN 10  CREATININE 0.79  CALCIUM 9.9   Disposition   Pt is being discharged home today in good condition.  Follow-up Plans & Appointments    Follow-up Information    Azalee Course, [place] Follow up on 04/08/2018.   Specialties:  Cardiology, Radiology Why:  Your follow up appointment will be on 04/08/18 at 11am. Please arrive by 1045am. Contact information: [address] [place] [address] [phone]          Discharge Instructions    Amb Referral to Cardiac Rehabilitation   Complete by:  As directed    Diagnosis:  PTCA   Call MD for:  difficulty breathing, headache or visual disturbances   Complete by:  As directed    Call MD for:  extreme fatigue   Complete by:  As directed    Call MD for:  persistant dizziness or light-headedness   Complete by:  As directed    Call MD for:  persistant nausea and vomiting   Complete by:  As directed    Call  MD for:  redness, tenderness, or signs of infection (pain, swelling, redness, odor or green/yellow discharge around incision site)   Complete by:  As directed    Call MD for:  severe uncontrolled pain   Complete by:  As directed    Call MD for:  temperature >100.4   Complete by:  As directed    Diet - low sodium heart healthy   Complete by:  As directed    Discharge instructions   Complete by:  As directed    No driving for 3 days. No lifting over 5 lbs for 1 week. No sexual activity for 1 week. Keep procedure site clean & dry. If you notice increased pain, swelling, bleeding or pus, call/return!  You may shower, but no soaking baths/hot tubs/pools for 1 week.   If you notice any bleeding such as blood in stool, black tarry stools, blood in urine, nosebleeds or any other unusual bleeding, call your doctor immediately. It is not normal to have this kind of bleeding while on a blood thinner and usually indicates there is an underlying problem with one of  your body systems that needs to be checked out.   Increase activity slowly   Complete by:  As directed      Discharge Medications   Allergies as of 04/02/2018      Reactions   Rosuvastatin    MUSCLE PAIN   Ampicillin Nausea And Vomiting   DID THE REACTION INVOLVE: Swelling of the face/tongue/throat, SOB, or low BP? No Sudden or severe rash/hives, skin peeling, or the inside of the mouth or nose? No Did it require medical treatment? No When did it last happen?childhood allergy  If all above answers are "NO", may proceed with cephalosporin use.   Metformin Diarrhea   Penicillins    DID THE REACTION INVOLVE: Swelling of the face/tongue/throat, SOB, or low BP? Unknown Sudden or severe rash/hives, skin peeling, or the inside of the mouth or nose? Unknown Did it require medical treatment? Unknown When did it last happen?childhood allergy If all above answers are "NO", may proceed with cephalosporin use.      Medication List    TAKE these medications   ARTIFICIAL TEAR SOLUTION OP Place 1 drop into both eyes daily as needed (dry eyes).   aspirin 81 MG EC tablet Take 1 tablet (81 mg total) by mouth daily.   atorvastatin 80 MG tablet Commonly known as:  LIPITOR Take 1 tablet (80 mg total) by mouth daily. What changed:    medication strength  how much to take  when to take this   clopidogrel 75 MG tablet Commonly known as:  PLAVIX TAKE 1 TABLET BY MOUTH EVERY DAY   Icosapent Ethyl 1 g Caps Take 2 capsules (2 g total) by mouth 2 (two) times daily.   nitroGLYCERIN 0.4 MG SL tablet Commonly known as:  NITROSTAT PLACE 1 TABLET (0.4 MG TOTAL) UNDER THE TONGUE EVERY 5 (FIVE) MINUTES AS NEEDED FOR CHEST PAIN.   TRESIBA FLEXTOUCH 200 UNIT/ML Sopn Generic drug:  Insulin Degludec Inject 40 Units as directed daily.   TRULICITY 1.5 MG/0.5ML Sopn Generic drug:  Dulaglutide Inject 1.5 mg into the skin every Sunday.        Acute coronary syndrome (MI, NSTEMI,  STEMI, etc) this admission?: Yes.     AHA/ACC Clinical Performance & Quality Measures: 1. Aspirin prescribed? - Yes 2. ADP Receptor Inhibitor (Plavix/Clopidogrel, Brilinta/Ticagrelor or Effient/Prasugrel) prescribed (includes medically managed patients)? - Yes 3. Beta Blocker prescribed? -  Yes 4. High Intensity Statin (Lipitor 40-80mg  or Crestor 20-40mg ) prescribed? - Yes 5. EF assessed during THIS hospitalization? - No - Yes  6. For EF <40%, was ACEI/ARB prescribed? - Not Applicable (EF >/= 40%) 7. For EF <40%, Aldosterone Antagonist (Spironolactone or Eplerenone) prescribed? - Not Applicable (EF >/= 40%) 8. Cardiac Rehab Phase II ordered (Included Medically managed Patients)? - Yes     Outstanding Labs/Studies   None   Duration of Discharge Encounter   Greater than 30 minutes including physician time.  Signed, Georgie ChardJill , NP 04/02/2018, 1:56 PM

## 2018-04-02 NOTE — Progress Notes (Signed)
Ed completed/reviewed with pt and wife. He exercises daily and watches his diet. Will refer to [place] CRPII however pt prefers to exercise on his own. [id number] Ethelda Chick CES, ACSM 12:03 PM 04/02/2018

## 2018-04-02 NOTE — Progress Notes (Signed)
Client c/o " feeling like going to pass out and feels like short of breath" paged PA to see

## 2018-04-02 NOTE — Progress Notes (Signed)
Dr Jacques Navy in to see client

## 2018-04-02 NOTE — Progress Notes (Signed)
Dr Swaziland in and OK to d/c home

## 2018-04-02 NOTE — Interval H&P Note (Signed)
History and Physical Interval Note:  04/02/2018 8:37 AM  Danny [name]  has presented today for surgery, with the diagnosis of angina  The various methods of treatment have been discussed with the patient and family. After consideration of risks, benefits and other options for treatment, the patient has consented to  Procedure(s): LEFT HEART CATH AND CORONARY ANGIOGRAPHY (N/A) as a surgical intervention .  The patient's history has been reviewed, patient examined, no change in status, stable for surgery.  I have reviewed the patient's chart and labs.  Questions were answered to the patient's satisfaction.   Cath Lab Visit (complete for each Cath Lab visit)  Clinical Evaluation Leading to the Procedure:   ACS: Yes.    Non-ACS:    Anginal Classification: CCS III  Anti-ischemic medical therapy: Minimal Therapy (1 class of medications)  Non-Invasive Test Results: No non-invasive testing performed  Prior CABG: No previous CABG        [name] 04/02/2018 8:37 AM

## 2018-04-05 ENCOUNTER — Encounter (HOSPITAL_COMMUNITY): Payer: Self-pay | Admitting: Cardiology

## 2018-04-07 ENCOUNTER — Telehealth (HOSPITAL_COMMUNITY): Payer: Self-pay

## 2018-04-07 NOTE — Telephone Encounter (Signed)
Per Phrase 1 pt will exercise on his own.  Closed referral

## 2018-04-08 ENCOUNTER — Ambulatory Visit: Payer: BLUE CROSS/BLUE SHIELD | Admitting: Physician Assistant

## 2018-04-08 ENCOUNTER — Encounter (HOSPITAL_COMMUNITY): Payer: Self-pay | Admitting: Cardiology

## 2018-04-16 ENCOUNTER — Ambulatory Visit (INDEPENDENT_AMBULATORY_CARE_PROVIDER_SITE_OTHER): Payer: BLUE CROSS/BLUE SHIELD | Admitting: Internal Medicine

## 2018-04-16 ENCOUNTER — Encounter: Payer: Self-pay | Admitting: Internal Medicine

## 2018-04-16 VITALS — BP 110/72 | HR 73 | Ht 65.0 in | Wt 165.0 lb

## 2018-04-16 DIAGNOSIS — E781 Pure hyperglyceridemia: Secondary | ICD-10-CM | POA: Diagnosis not present

## 2018-04-16 DIAGNOSIS — Z9861 Coronary angioplasty status: Secondary | ICD-10-CM | POA: Diagnosis not present

## 2018-04-16 DIAGNOSIS — E785 Hyperlipidemia, unspecified: Secondary | ICD-10-CM

## 2018-04-16 DIAGNOSIS — I2510 Atherosclerotic heart disease of native coronary artery without angina pectoris: Secondary | ICD-10-CM

## 2018-04-16 NOTE — Progress Notes (Signed)
OFFICE CONSULT NOTE  Chief Complaint:  Follow-up dyslipidemia  Primary Care Physician: Aliene Beams, MD  HPI:  Danny [name] is a 53 y.o. male who is being seen today for the evaluation of dyslipidemia at the request of Dr. Antoine Poche. This is a pleasant 53 year old male was kindly referred to me by Dr. Antoine Poche for evaluation of dyslipidemia.  He does have a history of ASCVD with prior PCI to the right coronary artery in 2006.  He has had additional coronary interventions subsequently in 2016, and has type 2 diabetes, marked dyslipidemia and family history of heart disease.  In addition he has a history of statin intolerance, having had side effects on simvastatin and rosuvastatin which caused severe leg cramps at night.  Recently he was switched to atorvastatin after another PCI in May 2019 and reports he had significant leg pain on the 40 mg dose since then.  His most recent lipid profile from May showed a total cholesterol 163, triglycerides 619, HDL 22 and LDL was not able to be calculated.  Hemoglobin A1c of 8.2.  He reports long-standing history of increased triglycerides, despite eating a plant-based diet and trying to watch carbohydrate intake.  04/16/2018  Danny [name] returns today for follow-up dyslipidemia.  Unfortunately he recently presented with progressive chest pain and underwent left heart catheterization on April 02, 2018.  Demonstrated in-stent restenosis of a distal RCA stent.  Apparently this was underexpanded.  He underwent balloon angioplasty by Dr. Swaziland and has had relief of his chest pain.  During that hospitalization his atorvastatin was increased to 80 mg and he is on Vascepa.  He had been taking it pretty regularly however stopped it for a week or 2 thinking that his chest pain may be related to that.  He is now back on the medication.  He has not had repeat labs.  Unfortunately today he is not fasting.  He was scheduled for a hospital follow-up next Monday however I  do not feel that that is necessary in addition to this visit.  He has no further anginal symptoms.  He is compliant with the aspirin and Plavix.  PMHx:  Past Medical History:  Diagnosis Date  . CAD (coronary artery disease)    COSTAR study DES Stent to RCA 2006;   LHC  05/19/12: oLM 20%, LAD 40-50%, pD1 50%, mCFX 30-40%, mRCA stent with severe ISR and 70% beyond the stent, dRCA 50%, pPDA 50%, pPLA 30-40%.  EF 55-65%. =>  PCI:  Overlapping Promus DES x2 to the mid RCA ISR. [place] s/p DES to LCx  c.95% circ DES 10/16,  [place]   . CAD in native artery, hx of multiple PCIs 08/19/2017  . DM2 (diabetes mellitus, type 2) (HCC)   . GERD (gastroesophageal reflux disease)   . HLD (hyperlipidemia)   . Obesity     Past Surgical History:  Procedure Laterality Date  . CARDIAC CATHETERIZATION N/A 01/05/2015   Procedure: Left Heart Cath and Coronary Angiography;  Surgeon: Peter M Swaziland, MD;  Location: [facility] INVASIVE CV LAB;  Service: Cardiovascular;  Laterality: N/A;  . CARDIAC CATHETERIZATION  01/05/2015   Procedure: Coronary Stent Intervention;  Surgeon: Peter M Swaziland, MD;  Location: [facility] INVASIVE CV LAB;  Service: Cardiovascular;;  . CORONARY ANGIOPLASTY WITH STENT PLACEMENT  05/19/2012   DES   to RCA   by Dr Excell Seltzer  . CORONARY BALLOON ANGIOPLASTY N/A 04/02/2018   Procedure: CORONARY BALLOON ANGIOPLASTY;  Surgeon: Swaziland, Peter M, MD;  Location: [facility] INVASIVE  CV LAB;  Service: Cardiovascular;  Laterality: N/A;  . CORONARY STENT INTERVENTION N/A 08/18/2017   Procedure: CORONARY STENT INTERVENTION;  Surgeon: Marykay LexHarding, David W, MD;  [facility];  Service: Cardiovascular;  Laterality: N/A;  . CORONARY STENT PLACEMENT  01/05/2015   mid cx  des  . INTRAVASCULAR ULTRASOUND/IVUS N/A 04/02/2018   Procedure: Intravascular Ultrasound/IVUS;  Surgeon: SwazilandJordan, Peter M, MD;  [facility];  Service: Cardiovascular;  Laterality: N/A;  . LEFT HEART CATH AND CORONARY ANGIOGRAPHY N/A 08/18/2017    Procedure: LEFT HEART CATH AND CORONARY ANGIOGRAPHY;  Surgeon: Marykay LexHarding, David W, MD;  [facility];  Service: Cardiovascular;  Laterality: N/A;  . LEFT HEART CATH AND CORONARY ANGIOGRAPHY N/A 04/02/2018   Procedure: LEFT HEART CATH AND CORONARY ANGIOGRAPHY;  Surgeon: SwazilandJordan, Peter M, MD;  [facility];  Service: Cardiovascular;  Laterality: N/A;  . PERCUTANEOUS CORONARY STENT INTERVENTION (PCI-S) N/A 05/19/2012   Procedure: PERCUTANEOUS CORONARY STENT INTERVENTION (PCI-S);  Surgeon: Tonny BollmanMichael Cooper, MD;  [facility];  Service: Cardiovascular;  Laterality: N/A;    FAMHx:  Family History  Problem Relation Age of Onset  . Diabetes Mother   . Coronary artery disease Father [id number]  . Heart attack Father     SOCHx:   reports that he has never smoked. He has never used smokeless tobacco. He reports current alcohol use. He reports that he does not use drugs.  ALLERGIES:  Allergies  Allergen Reactions  . Rosuvastatin     MUSCLE PAIN  . Ampicillin Nausea And Vomiting    DID THE REACTION INVOLVE: Swelling of the face/tongue/throat, SOB, or low BP? No Sudden or severe rash/hives, skin peeling, or the inside of the mouth or nose? No Did it require medical treatment? No When did it last happen?childhood allergy  If all above answers are "NO", may proceed with cephalosporin use.   . Metformin Diarrhea  . Penicillins     DID THE REACTION INVOLVE: Swelling of the face/tongue/throat, SOB, or low BP? Unknown Sudden or severe rash/hives, skin peeling, or the inside of the mouth or nose? Unknown Did it require medical treatment? Unknown When did it last happen?childhood allergy If all above answers are "NO", may proceed with cephalosporin use.     ROS: Pertinent items noted in HPI and remainder of comprehensive ROS otherwise negative.  HOME MEDS: Current Outpatient Medications on File Prior to Visit  Medication Sig Dispense Refill  . ARTIFICIAL  TEAR SOLUTION OP Place 1 drop into both eyes daily as needed (dry eyes).    Marland Kitchen. aspirin EC 81 MG EC tablet Take 1 tablet (81 mg total) by mouth daily.    Marland Kitchen. atorvastatin (LIPITOR) 80 MG tablet Take 1 tablet (80 mg total) by mouth daily. 90 tablet 4  . clopidogrel (PLAVIX) 75 MG tablet Take 1 tablet (75 mg total) by mouth daily. 30 tablet 11  . Dulaglutide (TRULICITY) 1.5 MG/0.5ML SOPN Inject 1.5 mg into the skin every Sunday.    Bess Harvest. Icosapent Ethyl 1 g CAPS Take 2 capsules (2 g total) by mouth 2 (two) times daily. 360 capsule 3  . nitroGLYCERIN (NITROSTAT) 0.4 MG SL tablet PLACE 1 TABLET (0.4 MG TOTAL) UNDER THE TONGUE EVERY 5 (FIVE) MINUTES AS NEEDED FOR CHEST PAIN. 25 tablet 2  . TRESIBA FLEXTOUCH 200 UNIT/ML SOPN Inject 40 Units as directed daily.   5   No current facility-administered medications on file prior to visit.  LABS/IMAGING: No results found for this or any previous visit (from the past 48 hour(s)). No results found.  LIPID PANEL:    Component Value Date/Time   CHOL 163 08/16/2017 0320   TRIG 619 (H) 08/16/2017 0320   HDL 22 (L) 08/16/2017 0320   CHOLHDL 7.4 08/16/2017 0320   VLDL UNABLE TO CALCULATE IF TRIGLYCERIDE OVER 400 mg/dL 13/08/657805/26/2019 46960320   LDLCALC UNABLE TO CALCULATE IF TRIGLYCERIDE OVER 400 mg/dL 29/52/841305/26/2019 24400320   LDLDIRECT 57.7 08/25/2012 0951    WEIGHTS: Wt Readings from Last 3 Encounters:  04/16/18 165 lb (74.8 kg)  04/02/18 168 lb (76.2 kg)  03/22/18 174 lb 12.8 oz (79.3 kg)    VITALS: BP 110/72   Pulse 73   Ht 5\' 5"  (1.651 m)   Wt 165 lb (74.8 kg)   BMI 27.46 kg/m   EXAM: General appearance: alert and no distress Neck: no carotid bruit, no JVD and thyroid not enlarged, symmetric, no tenderness/mass/nodules Lungs: clear to auscultation bilaterally Heart: regular rate and rhythm, S1, S2 normal, no murmur, click, rub or gallop Abdomen: soft, non-tender; bowel sounds normal; no masses,  no organomegaly Extremities: extremities normal,  atraumatic, no cyanosis or edema and No tendon xanthomas Pulses: 2+ and symmetric Skin: Skin color, texture, turgor normal. No rashes or lesions Neurologic: Grossly normal Psych: Pleasant  EKG: Deferred  ASSESSMENT: 1. Coronary artery disease with multiple recurrent PCI's 2. Poorly controlled dyslipidemia with primary hypertriglyceridemia (suspect LPL deficiency) 3. Type 2 diabetes, A1c 8.2 4. Dyslipidemia  PLAN: 1.   Danny [name] had recurrent chest pain and now was found to have in-stent restenosis of a distal RCA stent which he had balloon angioplasty.  We have not done a reassessment of his lipids.  Recently during this hospitalization his atorvastatin was increased to 80 mg.  He is been mostly compliant with his Vascepa but stopped it for about a week prior to his chest pain because he thought this was possibly the reason for.  Although he has a history of statin intolerance he seems to be taking the atorvastatin without issues.  We will plan a repeat assessment of his lipid profile which understandably will not be maximally reduced and likely repeat this study in 6 months.  He will follow-up with Dr. Antoine PocheHochrein his primary cardiologist in 3 months.  Chrystie NoseKenneth C. Hilty, MD, [facility] TransplantFACC, FACP    [facility]  Medical Director of the Advanced Lipid Disorders &  Cardiovascular Risk Reduction Clinic Diplomate of the American Board of Clinical Lipidology Attending Cardiologist  Direct Dial: [phone]  Fax: [phone]  Website:  [email or web address]  Kenneth C Hilty 04/16/2018, 8:36 AM

## 2018-04-16 NOTE — Patient Instructions (Signed)
Medication Instructions:  Continue current medications If you need a refill on your cardiac medications before your next appointment, please call your pharmacy.   Lab work: FASTING lab work today - direct LDL, lipid panel If you have labs (blood work) drawn today and your tests are completely normal, you will receive your results only by: Marland Kitchen MyChart Message (if you have MyChart) OR . A paper copy in the mail If you have any lab test that is abnormal or we need to change your treatment, we will call you to review the results.  Testing/Procedures: NONE  Follow-Up: At [facility], you and your health needs are our priority.  As part of our continuing mission to provide you with exceptional heart care, we have created designated Provider Care Teams.  These Care Teams include your primary Cardiologist (physician) and Advanced Practice Providers (APPs -  Physician Assistants and Nurse Practitioners) who all work together to provide you with the care you need, when you need it. . You will need a follow up appointment in 3 months with Dr. Antoine Poche for general cardiology follow up . You will need a follow up appointment in 6 months with Dr. Rennis Golden for lipid clinic follow up. Please call our office 2 months in advance you schedule. You should receive a reminder about this via MyChart.  Any Other Special Instructions Will Be Listed Below (If Applicable).

## 2018-04-19 ENCOUNTER — Ambulatory Visit: Payer: BLUE CROSS/BLUE SHIELD | Admitting: Physician Assistant

## 2018-06-16 DIAGNOSIS — I2510 Atherosclerotic heart disease of native coronary artery without angina pectoris: Secondary | ICD-10-CM | POA: Diagnosis not present

## 2018-06-16 DIAGNOSIS — E1140 Type 2 diabetes mellitus with diabetic neuropathy, unspecified: Secondary | ICD-10-CM | POA: Diagnosis not present

## 2018-06-16 DIAGNOSIS — E1169 Type 2 diabetes mellitus with other specified complication: Secondary | ICD-10-CM | POA: Diagnosis not present

## 2018-06-16 DIAGNOSIS — Z794 Long term (current) use of insulin: Secondary | ICD-10-CM | POA: Diagnosis not present

## 2018-07-15 ENCOUNTER — Telehealth: Payer: Self-pay | Admitting: Cardiology

## 2018-07-15 NOTE — Progress Notes (Signed)
Virtual Visit via Video Note   This visit type was conducted due to national recommendations for restrictions regarding the COVID-19 Pandemic (e.g. social distancing) in an effort to limit this patient's exposure and mitigate transmission in our community.  Due to his co-morbid illnesses, this patient is at least at moderate risk for complications without adequate follow up.  This format is felt to be most appropriate for this patient at this time.  All issues noted in this document were discussed and addressed.  A limited physical exam was performed with this format.  Please refer to the patient's chart for his consent to telehealth for [facility].   Evaluation Performed:  Follow-up visit  Date:  07/16/2018   ID:  VILAS EDGERLY, DOB 01/07/1966, MRN [id number]  Patient Location: Home Provider Location: Home  PCP:  Aliene Beams, MD  Cardiologist:  [name], MD  Electrophysiologist:  None   Chief Complaint:  CAD  History of Present Illness:    Danny [name] is a 53 y.o. male who presents for follow up of CAD.  He had a history of PCI to distal RCA in 2006. She had a severe in-stent restenosis by cardiac catheterization in February 2014 this was treated with a overlapping drug-eluting stent. Patient had a cardiac catheterization on 01/05/2015 that showed 95% mid left circumflex lesion treated with a drug-eluting stent. Echo obtained on 5/27 showed EF 65 to 70%, grade 1 DD, mild TR.  Cardiac catheterization obtained on 08/10/2017 showed 95% distal RCA lesion treated with 2.75 x 12 mm drug-eluting stent, EF 55 to 65%.  He was last in the hospital in Jan with unstable angina again.  He underwent cardiac catheterization with results as below.  He was found to have distal RCA stent restenosis.  He had PCI of the in-stent restenosis.  He presents for follow up.  I reviewed these records for this visit.    Since I last saw him he has been doing well.  He denies chest pain, neck or arm  discomfort.  He has no palpitations, presyncope or syncope.  He is working.  He takes the stairs at work.  Has not needed any nitroglycerin since he was hospitalized.   The patient does not have symptoms concerning for COVID-19 infection (fever, chills, cough, or new shortness of breath).    Past Medical History:  Diagnosis Date  . CAD (coronary artery disease)    COSTAR study DES Stent to RCA 2006;   LHC  05/19/12: oLM 20%, LAD 40-50%, pD1 50%, mCFX 30-40%, mRCA stent with severe ISR and 70% beyond the stent, dRCA 50%, pPDA 50%, pPLA 30-40%.  EF 55-65%. =>  PCI:  Overlapping Promus DES x2 to the mid RCA ISR. [place] s/p DES to LCx  c.95% circ DES 10/16,  [place]   . CAD in native artery, hx of multiple PCIs 08/19/2017  . DM2 (diabetes mellitus, type 2) (HCC)   . GERD (gastroesophageal reflux disease)   . HLD (hyperlipidemia)   . Obesity    Past Surgical History:  Procedure Laterality Date  . CARDIAC CATHETERIZATION N/A 01/05/2015   Procedure: Left Heart Cath and Coronary Angiography;  Surgeon: Peter M Swaziland, MD;  Location: [facility] INVASIVE CV LAB;  Service: Cardiovascular;  Laterality: N/A;  . CARDIAC CATHETERIZATION  01/05/2015   Procedure: Coronary Stent Intervention;  Surgeon: Peter M Swaziland, MD;  Location: [facility] INVASIVE CV LAB;  Service: Cardiovascular;;  . CORONARY ANGIOPLASTY WITH STENT PLACEMENT  05/19/2012   DES  to RCA   by Dr Excell Seltzer  . CORONARY BALLOON ANGIOPLASTY N/A 04/02/2018   Procedure: CORONARY BALLOON ANGIOPLASTY;  Surgeon: Swaziland, Peter M, MD;  Location: [facility] INVASIVE CV LAB;  Service: Cardiovascular;  Laterality: N/A;  . CORONARY STENT INTERVENTION N/A 08/18/2017   Procedure: CORONARY STENT INTERVENTION;  Surgeon: Marykay Lex, MD;  [facility];  Service: Cardiovascular;  Laterality: N/A;  . CORONARY STENT PLACEMENT  01/05/2015   mid cx  des  . INTRAVASCULAR ULTRASOUND/IVUS N/A 04/02/2018   Procedure: Intravascular Ultrasound/IVUS;  Surgeon: Swaziland, Peter M, MD;   Location: [facility] INVASIVE CV LAB;  Service: Cardiovascular;  Laterality: N/A;  . LEFT HEART CATH AND CORONARY ANGIOGRAPHY N/A 08/18/2017   Procedure: LEFT HEART CATH AND CORONARY ANGIOGRAPHY;  Surgeon: Marykay Lex, MD;  Location: [facility] INVASIVE CV LAB;  Service: Cardiovascular;  Laterality: N/A;  . LEFT HEART CATH AND CORONARY ANGIOGRAPHY N/A 04/02/2018   Procedure: LEFT HEART CATH AND CORONARY ANGIOGRAPHY;  Surgeon: Swaziland, Peter M, MD;  Location: [facility] INVASIVE CV LAB;  Service: Cardiovascular;  Laterality: N/A;  . PERCUTANEOUS CORONARY STENT INTERVENTION (PCI-S) N/A 05/19/2012   Procedure: PERCUTANEOUS CORONARY STENT INTERVENTION (PCI-S);  Surgeon: Tonny Bollman, MD;  Location: [facility] CATH LAB;  Service: Cardiovascular;  Laterality: N/A;     Current Meds  Medication Sig  . ARTIFICIAL TEAR SOLUTION OP Place 1 drop into both eyes daily as needed (dry eyes).  Marland Kitchen aspirin EC 81 MG EC tablet Take 1 tablet (81 mg total) by mouth daily.  Marland Kitchen atorvastatin (LIPITOR) 80 MG tablet Take 1 tablet (80 mg total) by mouth daily.  . clopidogrel (PLAVIX) 75 MG tablet Take 1 tablet (75 mg total) by mouth daily.  . Dulaglutide (TRULICITY) 1.5 MG/0.5ML SOPN Inject 1.5 mg into the skin every Sunday.  Bess Harvest Ethyl 1 g CAPS Take 2 capsules (2 g total) by mouth 2 (two) times daily.  . INVOKANA 100 MG TABS tablet TK 1 T PO D  . nitroGLYCERIN (NITROSTAT) 0.4 MG SL tablet PLACE 1 TABLET (0.4 MG TOTAL) UNDER THE TONGUE EVERY 5 (FIVE) MINUTES AS NEEDED FOR CHEST PAIN.  . TRESIBA FLEXTOUCH 200 UNIT/ML SOPN Inject 40 Units as directed daily.      Allergies:   Rosuvastatin; Ampicillin; Metformin; and Penicillins   Social History   Tobacco Use  . Smoking status: Never Smoker  . Smokeless tobacco: Never Used  Substance Use Topics  . Alcohol use: Yes    Comment: rare  . Drug use: No     Family Hx: The patient's family history includes Coronary artery disease (age of onset: 67) in his father; Diabetes in his mother; Heart  attack in his father.  ROS:   Please see the history of present illness.    As stated in the HPI and negative for all other systems.   Prior CV studies:   The following studies were reviewed today:  Cardiac catheterization 04/02/2018:   Ost 1st Diag lesion is 50% stenosed.  Prox Cx lesion is 30% stenosed.  Non-stenotic Prox Cx to Mid Cx lesion was previously treated.  Prox RCA to Mid RCA lesion is 30% stenosed.  Prox RCA lesion is 50% stenosed.  Dist RCA-1 lesion is 30% stenosed.  Post Atrio lesion is 60% stenosed.  Balloon angioplasty was performed.  Dist RCA-2 lesion is 95% stenosed.  Mid LAD lesion is 75% stenosed.  Ost LAD to Prox LAD lesion is 40% stenosed.  Mid Cx to Dist Cx lesion is 40%  stenosed.  Post intervention, there is a 0% residual stenosis.  Balloon angioplasty was performed using a BALLOON SAPPHIRE Sebastian 3.25X10.  The left ventricular systolic function is normal.  LV end diastolic pressure is normal.  The left ventricular ejection fraction is 55-65% by visual estimate.  1. Patient has moderate diffuse 3 vessel CAD. His culprit lesion is in the distal RCA with in stent restenosis. By OCT this is secondary to underexpansion of the stent with intimal hyperplasia.  2. Normal LV function 3. Normal LVEDP 4. Successful PCI with balloon angioplasty of the distal RCA for in stent restenosis.  Labs/Other Tests and Data Reviewed:    EKG:  No ECG reviewed.  Recent Labs: 03/31/2018: BUN 10; Creatinine, Ser 0.79; Hemoglobin 14.2; Platelets 249; Potassium 4.9; Sodium 137   Recent Lipid Panel Lab Results  Component Value Date/Time   CHOL 163 08/16/2017 03:20 AM   TRIG 619 (H) 08/16/2017 03:20 AM   HDL 22 (L) 08/16/2017 03:20 AM   CHOLHDL 7.4 08/16/2017 03:20 AM   LDLCALC UNABLE TO CALCULATE IF TRIGLYCERIDE OVER 400 mg/dL 16/10/960405/26/2019 54:0903:20 AM   LDLDIRECT 57.7 08/25/2012 09:51 AM    Wt Readings from Last 3 Encounters:  07/16/18 159 lb (72.1 kg)   04/16/18 165 lb (74.8 kg)  04/02/18 168 lb (76.2 kg)     Objective:    Vital Signs:  Ht 5\' 5"  (1.651 m)   Wt 159 lb (72.1 kg)   BMI 26.46 kg/m    VITAL SIGNS:  reviewed GEN:  no acute distress EYES:  sclerae anicteric, EOMI - Extraocular Movements Intact NEURO:  alert and oriented x 3, no obvious focal deficit PSYCH:  normal affect  ASSESSMENT & PLAN:    CAD:   The patient has no new sypmtoms.  No further cardiovascular testing is indicated.  We will continue with aggressive risk reduction and meds as listed.  DM:   His A1c in the hospital was 8.4 in May of last year.   He is followed by Dr. Shawnee KnappLevy who is actively managing his DM.   DYSLIPIDEMIA:    He had his Lipitor increased in the hospital earlier this year.  He will come back for a lipid profile.    COVID-19 Education: The signs and symptoms of COVID-19 were discussed with the patient and how to seek care for testing (follow up with PCP or arrange E-visit).  The importance of social distancing was discussed today.  Time:   Today, I have spent 16 minutes with the patient with telehealth technology discussing the above problems.     Medication Adjustments/Labs and Tests Ordered: Current medicines are reviewed at length with the patient today.  Concerns regarding medicines are outlined above.   Tests Ordered: No orders of the defined types were placed in this encounter.   Medication Changes: No orders of the defined types were placed in this encounter.   Disposition:  Follow up six months  Signed, Rollene RotundaJames Taylorann Tkach, MD  07/16/2018 9:51 AM    [facility]

## 2018-07-15 NOTE — Telephone Encounter (Signed)
Insurance added/Smartphone/ my chart/virtual consent/ pre reg completed

## 2018-07-16 ENCOUNTER — Encounter: Payer: Self-pay | Admitting: Cardiology

## 2018-07-16 ENCOUNTER — Telehealth (INDEPENDENT_AMBULATORY_CARE_PROVIDER_SITE_OTHER): Payer: BLUE CROSS/BLUE SHIELD | Admitting: Cardiology

## 2018-07-16 VITALS — Ht 65.0 in | Wt 159.0 lb

## 2018-07-16 DIAGNOSIS — Z7189 Other specified counseling: Secondary | ICD-10-CM

## 2018-07-16 DIAGNOSIS — Z9861 Coronary angioplasty status: Secondary | ICD-10-CM

## 2018-07-16 DIAGNOSIS — I2510 Atherosclerotic heart disease of native coronary artery without angina pectoris: Secondary | ICD-10-CM

## 2018-07-16 DIAGNOSIS — E119 Type 2 diabetes mellitus without complications: Secondary | ICD-10-CM

## 2018-07-16 DIAGNOSIS — Z794 Long term (current) use of insulin: Secondary | ICD-10-CM

## 2018-07-16 NOTE — Patient Instructions (Signed)

## 2018-07-19 ENCOUNTER — Other Ambulatory Visit: Payer: Self-pay

## 2018-07-23 ENCOUNTER — Telehealth: Payer: Self-pay | Admitting: *Deleted

## 2018-07-23 NOTE — Telephone Encounter (Signed)
PA for Vascepa 1 gm submitted to plan via cover my meds.

## 2018-08-10 ENCOUNTER — Telehealth: Payer: Self-pay | Admitting: Internal Medicine

## 2018-08-10 NOTE — Telephone Encounter (Signed)
Faxed to [employer] completed general authorization/quantity limit exception form  Fax# [phone]

## 2018-09-23 DIAGNOSIS — E1159 Type 2 diabetes mellitus with other circulatory complications: Secondary | ICD-10-CM | POA: Diagnosis not present

## 2018-09-23 DIAGNOSIS — R1011 Right upper quadrant pain: Secondary | ICD-10-CM | POA: Diagnosis not present

## 2018-09-23 DIAGNOSIS — R197 Diarrhea, unspecified: Secondary | ICD-10-CM | POA: Diagnosis not present

## 2018-09-23 DIAGNOSIS — R55 Syncope and collapse: Secondary | ICD-10-CM | POA: Diagnosis not present

## 2018-09-28 DIAGNOSIS — E785 Hyperlipidemia, unspecified: Secondary | ICD-10-CM | POA: Diagnosis not present

## 2018-09-28 DIAGNOSIS — E781 Pure hyperglyceridemia: Secondary | ICD-10-CM | POA: Diagnosis not present

## 2018-09-28 LAB — LIPID PANEL
Chol/HDL Ratio: 3.1 ratio (ref 0.0–5.0)
Cholesterol, Total: 87 mg/dL — ABNORMAL LOW (ref 100–199)
HDL: 28 mg/dL — AB (ref >39)
LDL Calculated: 28 mg/dL (ref 0–99)
Triglycerides: 157 mg/dL — ABNORMAL HIGH (ref 0–149)
VLDL Cholesterol Cal: 31 mg/dL (ref 5–40)

## 2018-09-28 LAB — LDL CHOLESTEROL, DIRECT: LDL DIRECT: 38 mg/dL (ref 0–99)

## 2018-09-30 ENCOUNTER — Other Ambulatory Visit: Payer: Self-pay | Admitting: Family Medicine

## 2018-09-30 DIAGNOSIS — R1011 Right upper quadrant pain: Secondary | ICD-10-CM

## 2018-09-30 DIAGNOSIS — R197 Diarrhea, unspecified: Secondary | ICD-10-CM

## 2018-10-12 ENCOUNTER — Ambulatory Visit
Admission: RE | Admit: 2018-10-12 | Discharge: 2018-10-12 | Disposition: A | Discharge status: Home / Self Care | Payer: BLUE CROSS/BLUE SHIELD | Source: Ambulatory Visit | Attending: Family Medicine | Admitting: Family Medicine

## 2018-10-12 DIAGNOSIS — R197 Diarrhea, unspecified: Secondary | ICD-10-CM

## 2018-10-12 DIAGNOSIS — R1011 Right upper quadrant pain: Secondary | ICD-10-CM | POA: Diagnosis not present

## 2018-12-28 DIAGNOSIS — E781 Pure hyperglyceridemia: Secondary | ICD-10-CM | POA: Diagnosis not present

## 2018-12-28 DIAGNOSIS — I2510 Atherosclerotic heart disease of native coronary artery without angina pectoris: Secondary | ICD-10-CM | POA: Diagnosis not present

## 2018-12-28 DIAGNOSIS — E782 Mixed hyperlipidemia: Secondary | ICD-10-CM | POA: Diagnosis not present

## 2018-12-29 NOTE — Progress Notes (Signed)
Virtual Visit via Video Note   This visit type was conducted due to national recommendations for restrictions regarding the COVID-19 Pandemic (e.g. social distancing) in an effort to limit this patient's exposure and mitigate transmission in our community.  Due to his co-morbid illnesses, this patient is at least at moderate risk for complications without adequate follow up.  This format is felt to be most appropriate for this patient at this time.  All issues noted in this document were discussed and addressed.  A limited physical exam was performed with this format.  Please refer to the patient's chart for his consent to telehealth for [facility].   Evaluation Performed:  Follow-up visit  Date:  12/30/2018   ID:  Danny [name], DOB 1965-06-03, MRN [id number]  Patient Location: Home Provider Location: Home  PCP:  Aliene Beams, MD  Cardiologist:  [name], MD  Electrophysiologist:  None   Chief Complaint:  CAD  History of Present Illness:    Danny [name] is a 53 y.o. male who presents for follow up of CAD.  He had a history of PCI to distal RCA in 2006. She had a severe in-stent restenosis by cardiac catheterization in February 2014 this was treated with a overlapping drug-eluting stent. Patient had a cardiac catheterization on 01/05/2015 that showed 95% mid left circumflex lesion treated with a drug-eluting stent. Echo obtained on 5/27 showed EF 65 to 70%, grade 1 DD, mild TR.  Cardiac catheterization obtained on 08/10/2017 showed 95% distal RCA lesion treated with 2.75 x 12 mm drug-eluting stent, EF 55 to 65%.  He was last in the hospital in Jan with unstable angina again.  He underwent cardiac catheterization with results as below.  He was found to have distal RCA stent restenosis.  He had PCI of the in-stent restenosis.  He presents for follow up.  Since I last saw him he is done very well.  He denies any cardiovascular symptoms.  He has been starting to do some  activities.  He is getting 8-10,000 steps a day.  He is not started hiking outside yet.  His weight is down to 152 pounds which is about 55 pounds from his peak.  His blood sugars is better controlled but not yet at target.  His lipids are at target.  His blood pressures well controlled. The patient denies any new symptoms such as chest discomfort, neck or arm discomfort. There has been no new shortness of breath, PND or orthopnea. There have been no reported palpitations, presyncope or syncope.   The patient does not have symptoms concerning for COVID-19 infection (fever, chills, cough, or new shortness of breath).    Past Medical History:  Diagnosis Date  . CAD (coronary artery disease)    COSTAR study DES Stent to RCA 2006;   LHC  05/19/12: oLM 20%, LAD 40-50%, pD1 50%, mCFX 30-40%, mRCA stent with severe ISR and 70% beyond the stent, dRCA 50%, pPDA 50%, pPLA 30-40%.  EF 55-65%. =>  PCI:  Overlapping Promus DES x2 to the mid RCA ISR. [place] s/p DES to LCx  c.95% circ DES 10/16,  [place]   . CAD in native artery, hx of multiple PCIs 08/19/2017  . DM2 (diabetes mellitus, type 2) (HCC)   . GERD (gastroesophageal reflux disease)   . HLD (hyperlipidemia)   . Obesity    Past Surgical History:  Procedure Laterality Date  . CARDIAC CATHETERIZATION N/A 01/05/2015   Procedure: Left Heart Cath and Coronary Angiography;  Surgeon: Peter M Martinique, MD;  Location: [place] [facility];  Service: Cardiovascular;  Laterality: N/A;  . CARDIAC CATHETERIZATION  01/05/2015   Procedure: Coronary Stent Intervention;  Surgeon: Peter M Martinique, MD;  Location: [place] [facility];  Service: Cardiovascular;;  . CORONARY ANGIOPLASTY WITH STENT PLACEMENT  05/19/2012   DES   to RCA   by Dr Burt Knack  . CORONARY BALLOON ANGIOPLASTY N/A 04/02/2018   Procedure: CORONARY BALLOON ANGIOPLASTY;  Surgeon: Martinique, Peter M, MD;  Location: [place] [facility];  Service: Cardiovascular;  Laterality: N/A;  . CORONARY STENT INTERVENTION N/A 08/18/2017    Procedure: CORONARY STENT INTERVENTION;  Surgeon: Leonie Man, MD;  Location: [place] [facility];  Service: Cardiovascular;  Laterality: N/A;  . CORONARY STENT PLACEMENT  01/05/2015   mid cx  des  . INTRAVASCULAR ULTRASOUND/IVUS N/A 04/02/2018   Procedure: Intravascular Ultrasound/IVUS;  Surgeon: Martinique, Peter M, MD;  Location: [place] [facility];  Service: Cardiovascular;  Laterality: N/A;  . LEFT HEART CATH AND CORONARY ANGIOGRAPHY N/A 08/18/2017   Procedure: LEFT HEART CATH AND CORONARY ANGIOGRAPHY;  Surgeon: Leonie Man, MD;  Location: [place] [facility];  Service: Cardiovascular;  Laterality: N/A;  . LEFT HEART CATH AND CORONARY ANGIOGRAPHY N/A 04/02/2018   Procedure: LEFT HEART CATH AND CORONARY ANGIOGRAPHY;  Surgeon: Martinique, Peter M, MD;  Location: [place] [facility];  Service: Cardiovascular;  Laterality: N/A;  . PERCUTANEOUS CORONARY STENT INTERVENTION (PCI-S) N/A 05/19/2012   Procedure: PERCUTANEOUS CORONARY STENT INTERVENTION (PCI-S);  Surgeon: Sherren Mocha, MD;  Location: [facility];  Service: Cardiovascular;  Laterality: N/A;     Prior to Admission medications   Medication Sig Start Date End Date Taking? Authorizing Provider  ARTIFICIAL TEAR SOLUTION OP Place 1 drop into both eyes daily as needed (dry eyes).   Yes [provider]  aspirin EC 81 MG EC tablet Take 1 tablet (81 mg total) by mouth daily. 01/06/15  Yes Eileen Stanford, PA-C  atorvastatin (LIPITOR) 80 MG tablet Take 1 tablet (80 mg total) by mouth daily. 04/02/18  Yes Kathyrn Drown D, NP  clopidogrel (PLAVIX) 75 MG tablet Take 1 tablet (75 mg total) by mouth daily. 04/02/18  Yes Kathyrn Drown D, NP  Dulaglutide (TRULICITY) 1.5 ZD/6.3OV SOPN Inject 1.5 mg into the skin every Sunday.   Yes [provider]  Icosapent Ethyl 1 g CAPS Take 2 capsules (2 g total) by mouth 2 (two) times daily. 03/22/18  Yes Meng, Hao, PA  INVOKANA 100 MG TABS tablet TK 1 T PO D 06/16/18  Yes [provider]  nitroGLYCERIN (NITROSTAT) 0.4 MG SL tablet PLACE 1 TABLET (0.4 MG TOTAL) UNDER THE TONGUE EVERY 5 (FIVE) MINUTES AS NEEDED FOR CHEST PAIN. 03/22/18  Yes Meng, Hao, PA  TRESIBA FLEXTOUCH 200 UNIT/ML SOPN Inject 40 Units as directed daily.  10/14/17  Yes [provider]     Allergies:   Rosuvastatin, Ampicillin, Metformin, and Penicillins   Social History   Tobacco Use  . Smoking status: Never Smoker  . Smokeless tobacco: Never Used  Substance Use Topics  . Alcohol use: Yes    Comment: rare  . Drug use: No     Family Hx: The patient's family history includes Coronary artery disease (age of onset: 32) in his father; Diabetes in his mother; Heart attack in his father.  ROS:   Please see the history of present illness.    As stated in the HPI and negative for  all other systems.   Prior CV studies:   The following studies were reviewed today:  Cardiac catheterization 04/02/2018:   Ost 1st Diag lesion is 50% stenosed.  Prox Cx lesion is 30% stenosed.  Non-stenotic Prox Cx to Mid Cx lesion was previously treated.  Prox RCA to Mid RCA lesion is 30% stenosed.  Prox RCA lesion is 50% stenosed.  Dist RCA-1 lesion is 30% stenosed.  Post Atrio lesion is 60% stenosed.  Balloon angioplasty was performed.  Dist RCA-2 lesion is 95% stenosed.  Mid LAD lesion is 75% stenosed.  Ost LAD to Prox LAD lesion is 40% stenosed.  Mid Cx to Dist Cx lesion is 40% stenosed.  Post intervention, there is a 0% residual stenosis.  Balloon angioplasty was performed using a BALLOON SAPPHIRE [place] 3.25X10.  The left ventricular systolic function is normal.  LV end diastolic pressure is normal.  The left ventricular ejection fraction is 55-65% by visual estimate.  1. Patient has moderate diffuse 3 vessel CAD. His culprit lesion is in the distal RCA with in stent restenosis. By OCT this is secondary to underexpansion of the stent with intimal hyperplasia.  2. Normal LV  function 3. Normal LVEDP 4. Successful PCI with balloon angioplasty of the distal RCA for in stent restenosis.  Labs/Other Tests and Data Reviewed:    EKG:  No ECG reviewed.  Recent Labs: 03/31/2018: BUN 10; Creatinine, Ser 0.79; Hemoglobin 14.2; Platelets 249; Potassium 4.9; Sodium 137   Recent Lipid Panel Lab Results  Component Value Date/Time   CHOL 87 (L) 09/28/2018 08:15 AM   TRIG 157 (H) 09/28/2018 08:15 AM   HDL 28 (L) 09/28/2018 08:15 AM   CHOLHDL 3.1 09/28/2018 08:15 AM   CHOLHDL 7.4 08/16/2017 03:20 AM   LDLCALC 28 09/28/2018 08:15 AM   LDLDIRECT 38 09/28/2018 08:15 AM   LDLDIRECT 57.7 08/25/2012 09:51 AM    Wt Readings from Last 3 Encounters:  12/30/18 153 lb (69.4 kg)  07/16/18 159 lb (72.1 kg)  04/16/18 165 lb (74.8 kg)     Objective:    Vital Signs:  BP 121/72   Pulse 86   Ht 5\' 5"  (1.651 m)   Wt 153 lb (69.4 kg)   BMI 25.46 kg/m    VITAL SIGNS:  reviewed GEN:  no acute distress EYES:  sclerae anicteric, EOMI - Extraocular Movements Intact NEURO:  alert and oriented x 3, no obvious focal deficit PSYCH:  normal affect  ASSESSMENT & PLAN:    CAD:  He did have several questions about his coronary anatomy I was able to pull up the films and look at them and reviewed these with him on the phone.  He does have diffuse disease but optimal medical management is preferred and he is having no symptoms.    DM:   His A1c in the hospital was  8.4 in May of last year.   However, he says it is much improved.  He is done very well with Invokana.  No change in therapy.   DYSLIPIDEMIA:    His LDL calculated is 28.  Direct is 38.  He is doing very well on the medications as listed.  I appreciate Dr.  June follow-up.  No change in therapy.  The patient will follow with me now that he is at target.  COVID-19 Education: The signs and symptoms of COVID-19 were discussed with the patient and how to seek care for testing (follow up with PCP or arrange E-visit).  The  importance  of social distancing was discussed today.  Time:   Today, I have spent 25 minutes with the patient with telehealth technology discussing the above problems.  I reviewed the cath films personally and discussed these with the patient.    Medication Adjustments/Labs and Tests Ordered: Current medicines are reviewed at length with the patient today.  Concerns regarding medicines are outlined above.   Tests Ordered: No orders of the defined types were placed in this encounter.   Medication Changes: No orders of the defined types were placed in this encounter.   Disposition:  Follow up with me in one year.   Signed, Rollene RotundaJames Uchechi Denison, MD  12/30/2018 10:56 AM     [facility]

## 2018-12-30 ENCOUNTER — Encounter: Payer: Self-pay | Admitting: Cardiology

## 2018-12-30 ENCOUNTER — Telehealth (INDEPENDENT_AMBULATORY_CARE_PROVIDER_SITE_OTHER): Payer: BC Managed Care – PPO | Admitting: Cardiology

## 2018-12-30 VITALS — BP 121/72 | HR 86 | Ht 65.0 in | Wt 153.0 lb

## 2018-12-30 DIAGNOSIS — I2510 Atherosclerotic heart disease of native coronary artery without angina pectoris: Secondary | ICD-10-CM

## 2018-12-30 DIAGNOSIS — E785 Hyperlipidemia, unspecified: Secondary | ICD-10-CM

## 2018-12-30 DIAGNOSIS — E119 Type 2 diabetes mellitus without complications: Secondary | ICD-10-CM

## 2018-12-30 NOTE — Patient Instructions (Signed)

## 2019-03-31 DIAGNOSIS — E1140 Type 2 diabetes mellitus with diabetic neuropathy, unspecified: Secondary | ICD-10-CM | POA: Diagnosis not present

## 2019-03-31 DIAGNOSIS — E1165 Type 2 diabetes mellitus with hyperglycemia: Secondary | ICD-10-CM | POA: Diagnosis not present

## 2019-03-31 DIAGNOSIS — Z794 Long term (current) use of insulin: Secondary | ICD-10-CM | POA: Diagnosis not present

## 2019-04-06 DIAGNOSIS — E1140 Type 2 diabetes mellitus with diabetic neuropathy, unspecified: Secondary | ICD-10-CM | POA: Diagnosis not present

## 2019-04-06 DIAGNOSIS — E785 Hyperlipidemia, unspecified: Secondary | ICD-10-CM | POA: Diagnosis not present

## 2019-04-06 DIAGNOSIS — Z794 Long term (current) use of insulin: Secondary | ICD-10-CM | POA: Diagnosis not present

## 2019-04-06 DIAGNOSIS — E1169 Type 2 diabetes mellitus with other specified complication: Secondary | ICD-10-CM | POA: Diagnosis not present

## 2019-04-11 DIAGNOSIS — M549 Dorsalgia, unspecified: Secondary | ICD-10-CM | POA: Diagnosis not present

## 2019-04-11 DIAGNOSIS — E782 Mixed hyperlipidemia: Secondary | ICD-10-CM | POA: Diagnosis not present

## 2019-04-11 DIAGNOSIS — E118 Type 2 diabetes mellitus with unspecified complications: Secondary | ICD-10-CM | POA: Diagnosis not present

## 2019-04-11 DIAGNOSIS — I2510 Atherosclerotic heart disease of native coronary artery without angina pectoris: Secondary | ICD-10-CM | POA: Diagnosis not present

## 2019-04-16 IMAGING — CR DG CHEST 2V
2 series · 2 of 2 positions shown · non-contrast
Comparison: 01/05/2015

CLINICAL DATA: Left-sided chest pain radiating to the left jaw.
History of diabetes and heart disease. Nonsmoker.

EXAM:
CHEST - 2 VIEW

[chest pa]
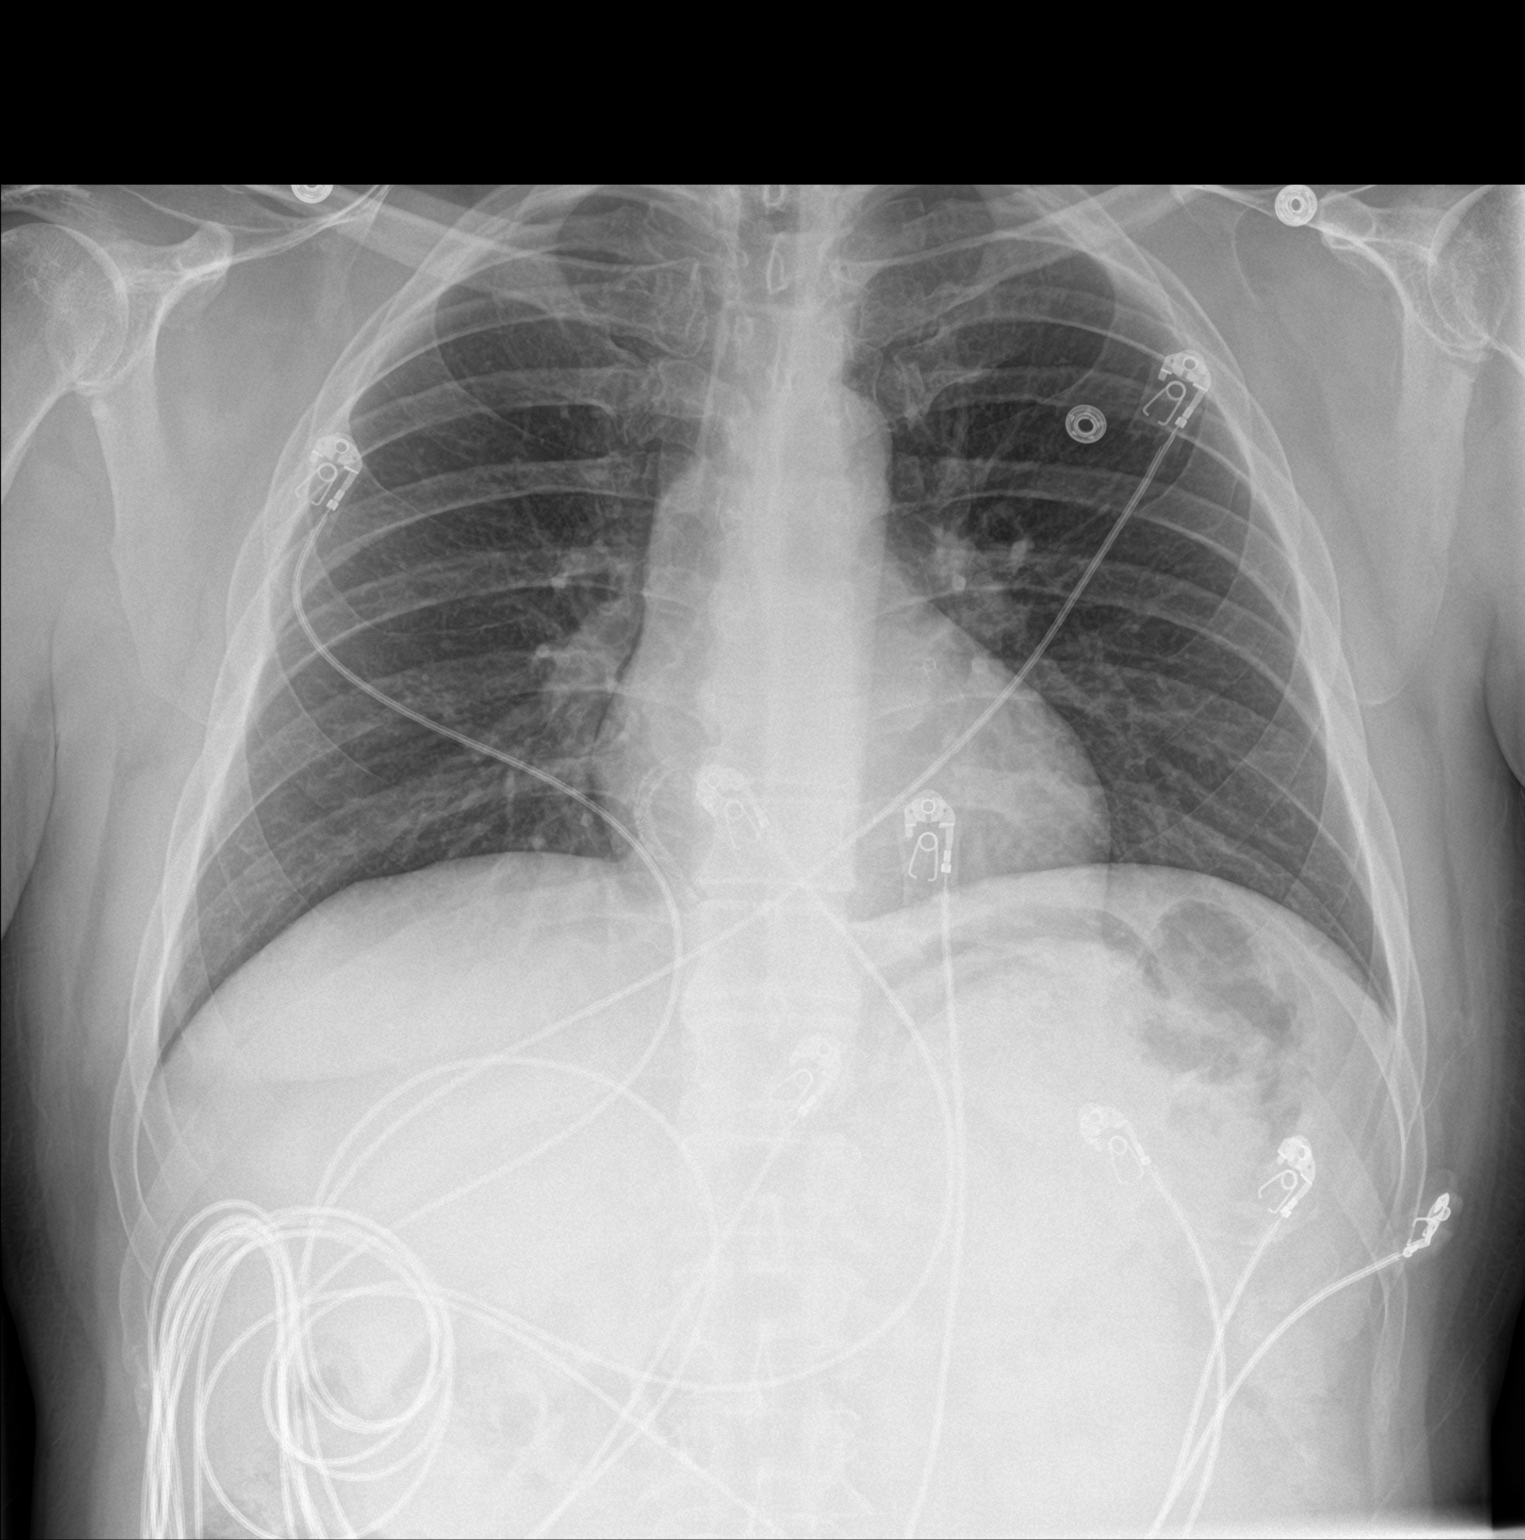

[chest lat]
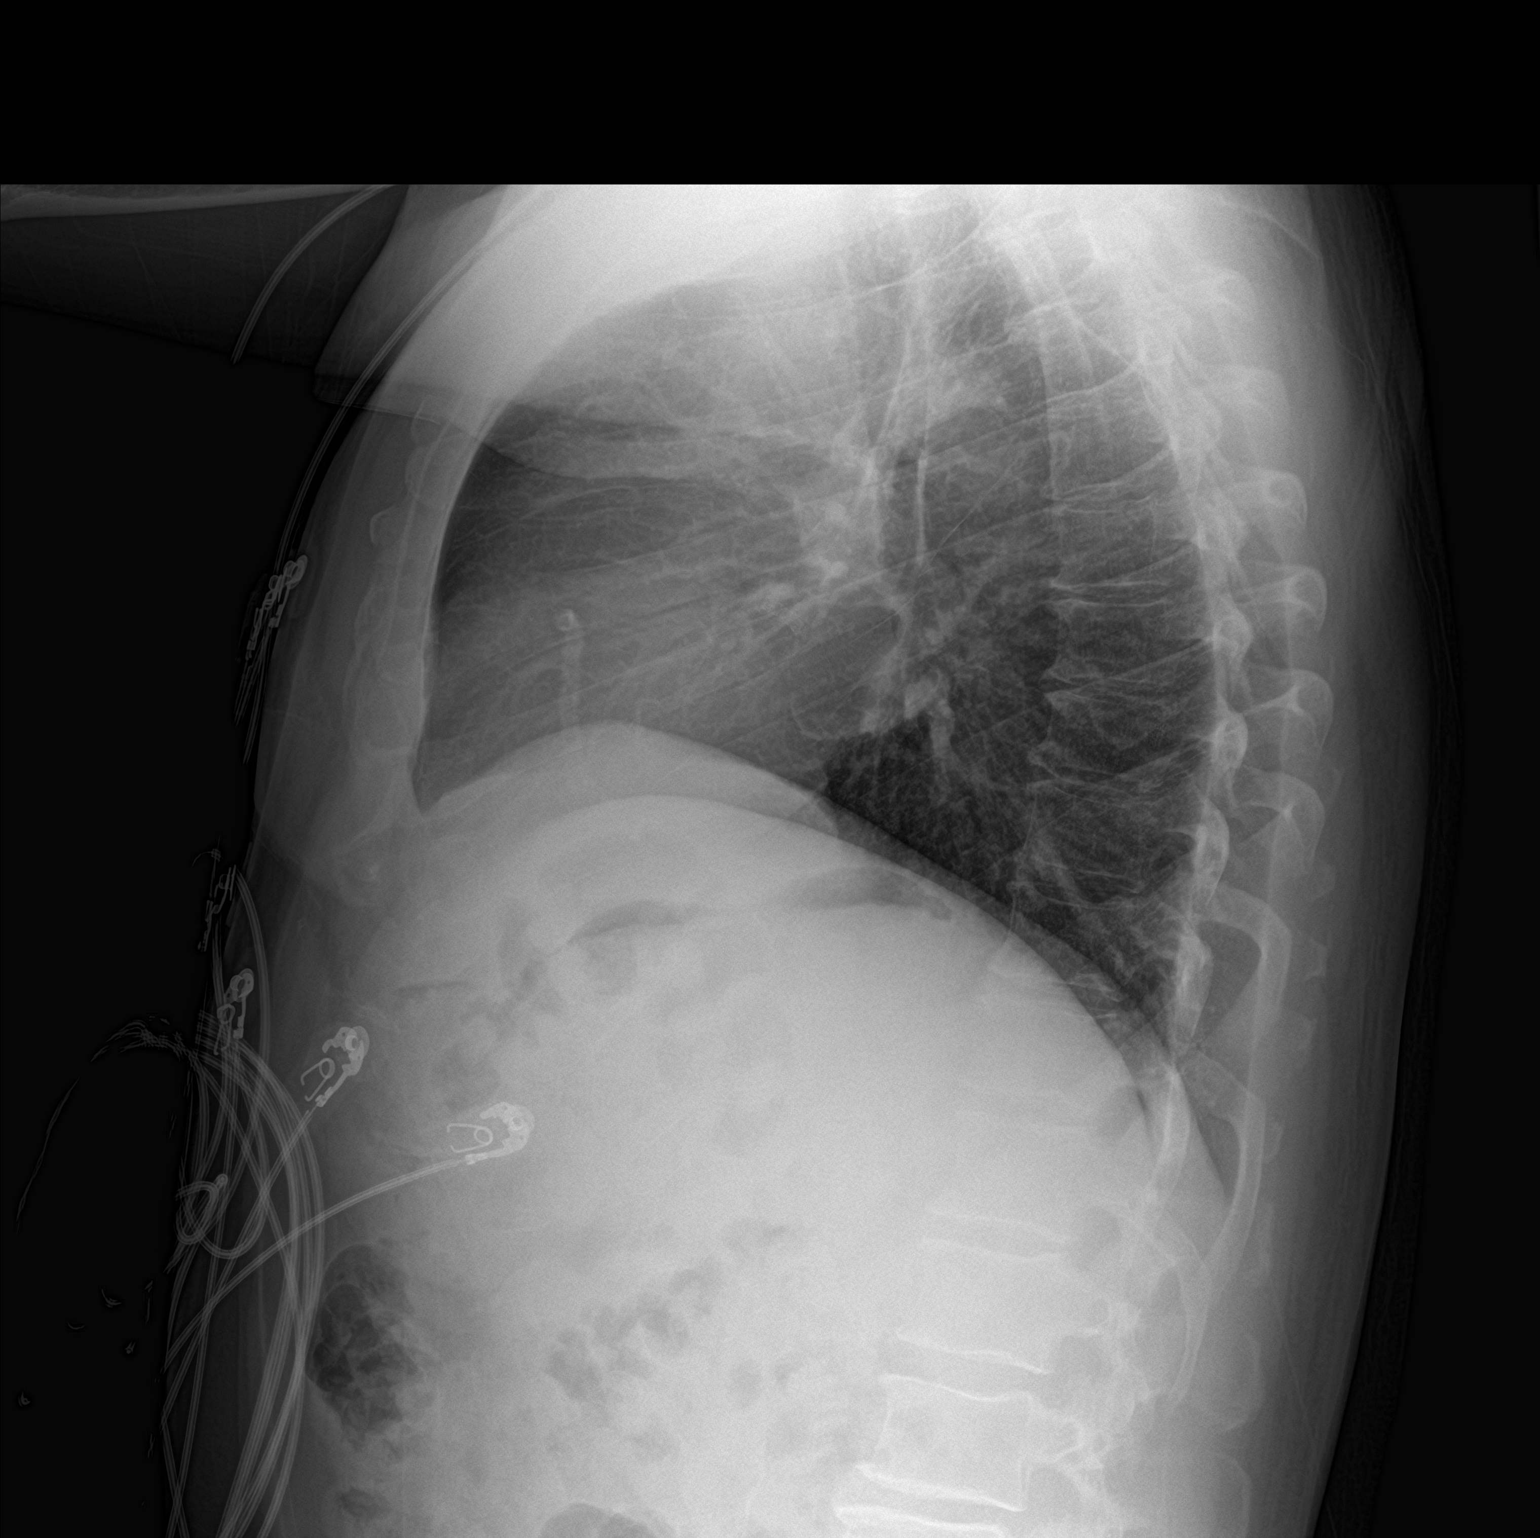

[2 of 2 positions shown; findings below may reference images not displayed]

FINDINGS: Normal heart size and pulmonary vascularity. No focal airspace
disease or consolidation in the lungs. No blunting of costophrenic
angles. No pneumothorax. Mediastinal contours appear intact.
Coronary artery stent. Degenerative changes in the spine and
shoulders.
IMPRESSION: No active cardiopulmonary disease.

## 2019-04-25 ENCOUNTER — Other Ambulatory Visit: Payer: Self-pay | Admitting: Cardiology

## 2019-04-26 NOTE — Telephone Encounter (Signed)
This is Dr. Hochrein's pt. °

## 2019-05-03 DIAGNOSIS — Z20828 Contact with and (suspected) exposure to other viral communicable diseases: Secondary | ICD-10-CM | POA: Diagnosis not present

## 2019-06-24 ENCOUNTER — Other Ambulatory Visit: Payer: Self-pay | Admitting: Cardiology

## 2019-12-23 ENCOUNTER — Other Ambulatory Visit: Payer: Self-pay | Admitting: Cardiology

## 2020-02-19 ENCOUNTER — Other Ambulatory Visit: Payer: Self-pay

## 2020-02-19 ENCOUNTER — Emergency Department (HOSPITAL_COMMUNITY)
Admission: EM | Admit: 2020-02-19 | Discharge: 2020-02-19 | Disposition: A | Discharge status: Home / Self Care | Payer: Managed Care, Other (non HMO) | Attending: Emergency Medicine | Admitting: Emergency Medicine

## 2020-02-19 ENCOUNTER — Encounter (HOSPITAL_COMMUNITY): Payer: Self-pay | Admitting: Emergency Medicine

## 2020-02-19 ENCOUNTER — Emergency Department (HOSPITAL_COMMUNITY): Payer: Managed Care, Other (non HMO)

## 2020-02-19 DIAGNOSIS — Z7984 Long term (current) use of oral hypoglycemic drugs: Secondary | ICD-10-CM | POA: Insufficient documentation

## 2020-02-19 DIAGNOSIS — I25119 Atherosclerotic heart disease of native coronary artery with unspecified angina pectoris: Secondary | ICD-10-CM | POA: Insufficient documentation

## 2020-02-19 DIAGNOSIS — R42 Dizziness and giddiness: Secondary | ICD-10-CM | POA: Diagnosis not present

## 2020-02-19 DIAGNOSIS — R0789 Other chest pain: Secondary | ICD-10-CM

## 2020-02-19 DIAGNOSIS — E119 Type 2 diabetes mellitus without complications: Secondary | ICD-10-CM | POA: Insufficient documentation

## 2020-02-19 DIAGNOSIS — Z7982 Long term (current) use of aspirin: Secondary | ICD-10-CM | POA: Diagnosis not present

## 2020-02-19 DIAGNOSIS — Z79899 Other long term (current) drug therapy: Secondary | ICD-10-CM | POA: Diagnosis not present

## 2020-02-19 DIAGNOSIS — Z955 Presence of coronary angioplasty implant and graft: Secondary | ICD-10-CM | POA: Insufficient documentation

## 2020-02-19 LAB — HEPATIC FUNCTION PANEL
ALT: 20 U/L (ref 0–44)
AST: 31 U/L (ref 15–41)
Albumin: 4 g/dL (ref 3.5–5.0)
Alkaline Phosphatase: 58 U/L (ref 38–126)
Bilirubin, Direct: 0.4 mg/dL — ABNORMAL HIGH (ref 0.0–0.2)
Indirect Bilirubin: 1 mg/dL — ABNORMAL HIGH (ref 0.3–0.9)
Total Bilirubin: 1.4 mg/dL — ABNORMAL HIGH (ref 0.3–1.2)
Total Protein: 6.7 g/dL (ref 6.5–8.1)

## 2020-02-19 LAB — BASIC METABOLIC PANEL
Anion gap: 10 (ref 5–15)
BUN: 13 mg/dL (ref 6–20)
CO2: 26 mmol/L (ref 22–32)
Calcium: 9.6 mg/dL (ref 8.9–10.3)
Chloride: 99 mmol/L (ref 98–111)
Creatinine, Ser: 0.76 mg/dL (ref 0.61–1.24)
GFR, Estimated: >60 mL/min (ref >60)
Glucose, Bld: 176 mg/dL — ABNORMAL HIGH (ref 70–99)
Potassium: 3.8 mmol/L (ref 3.5–5.1)
Sodium: 135 mmol/L (ref 135–145)

## 2020-02-19 LAB — TROPONIN I (HIGH SENSITIVITY)
Troponin I (High Sensitivity): <2 ng/L (ref <18)
Troponin I (High Sensitivity): 3 ng/L (ref <18)

## 2020-02-19 LAB — CBC
HCT: 37.4 % — ABNORMAL LOW (ref 39.0–52.0)
Hemoglobin: 13.1 g/dL (ref 13.0–17.0)
MCH: 30.6 pg (ref 26.0–34.0)
MCHC: 35 g/dL (ref 30.0–36.0)
MCV: 87.4 fL (ref 80.0–100.0)
Platelets: 213 10*3/uL (ref 150–400)
RBC: 4.28 MIL/uL (ref 4.22–5.81)
RDW: 12.1 % (ref 11.5–15.5)
WBC: 10.5 10*3/uL (ref 4.0–10.5)
nRBC: 0 % (ref 0.0–0.2)

## 2020-02-19 LAB — LIPASE, BLOOD: Lipase: 33 U/L (ref 11–51)

## 2020-02-19 MED ORDER — ESOMEPRAZOLE MAGNESIUM 40 MG PO CPDR: 40.0000 mg | DELAYED_RELEASE_CAPSULE | Freq: Every day | ORAL | 0 refills | Status: DC

## 2020-02-19 MED ORDER — ALUM & MAG HYDROXIDE-SIMETH 200-200-20 MG/5ML PO SUSP
30.0000 mL | Freq: Once | ORAL | Status: AC
  Administered 2020-02-19: 30 mL via ORAL
  Filled 2020-02-19: qty 30

## 2020-02-19 MED ORDER — LIDOCAINE VISCOUS HCL 2 % MT SOLN
15.0000 mL | Freq: Once | OROMUCOSAL | Status: AC
  Administered 2020-02-19: 15 mL via ORAL
  Filled 2020-02-19: qty 15

## 2020-02-19 MED ORDER — PANTOPRAZOLE SODIUM 40 MG IV SOLR
40.0000 mg | Freq: Once | INTRAVENOUS | Status: AC
  Administered 2020-02-19: 40 mg via INTRAVENOUS
  Filled 2020-02-19: qty 40

## 2020-02-19 NOTE — ED Notes (Signed)
Pt has been called X2 to go back to room and cannot be found.

## 2020-02-19 NOTE — ED Provider Notes (Signed)
[facility] EMERGENCY DEPARTMENT Provider Note   CSN: [id number] Arrival date & time: 02/19/20  1709     History Chief Complaint  Patient presents with  . Chest Pain    Danny [name] is a 54 y.o. male.  Danny [name] is a 54 y.o. male with a history of CAD with multiple previous PCI's, hypertension, hyperlipidemia, diabetes, GERD, obesity, who presents to the ED for evaluation of chest pain.  Patient states he has been having intermittent episodes of central chest pain and pressure over the past week, the most recent episode occurred today prior to arrival.  He states these episodes are very brief lasting only a few seconds but he reports it feels like an intense pressure in the center of his chest.  Pain is nonradiating.  He states pain is not worse with exertion, and that he was able to carry a toilet up a flight of stairs without experiencing any of this pain.  He has had multiple stents placed and states the pressure feels similar to a sensation he has had while he is in the Cath Lab but otherwise the symptoms are not similar at all to those he is experience prior to needing cardiac cath.  Usually his pain is worse with exertion and radiates.  He denies any associated shortness of breath.  No lightheadedness or syncope.  No nausea, vomiting or diaphoresis.  He states that today when the episode started he became a bit lightheaded and felt tunnel vision like he was going to pass out, was able to sit down and stop this.  Does report that this started after he felt the pain, but has not recurred since then, does have a history of some vasovagal syncope.  He reports that these episodes seem to come on in particular after eating or drinking something.  That is the only trigger he has found.  He took 2 nitro which he thinks may have helped pain, but it is so brief anyways he is not sure.  He called EMS after today's episode, refused aspirin because he is found that that seems to make  the symptoms worse.  Followed by Dr. Antoine Poche with cardiology.  No other aggravating relieving factors.        Past Medical History:  Diagnosis Date  . CAD (coronary artery disease)    COSTAR study DES Stent to RCA 2006;   LHC  05/19/12: oLM 20%, LAD 40-50%, pD1 50%, mCFX 30-40%, mRCA stent with severe ISR and 70% beyond the stent, dRCA 50%, pPDA 50%, pPLA 30-40%.  EF 55-65%. =>  PCI:  Overlapping Promus DES x2 to the mid RCA ISR. [place] s/p DES to LCx  c.95% circ DES 10/16,  [place]   . CAD in native artery, hx of multiple PCIs 08/19/2017  . DM2 (diabetes mellitus, type 2) (HCC)   . GERD (gastroesophageal reflux disease)   . HLD (hyperlipidemia)   . Obesity     Patient Active Problem List   Diagnosis Date Noted  . Coronary artery disease involving native coronary artery of native heart with unstable angina pectoris (HCC) 08/19/2017  . Autonomic orthostatic hypotension 12/23/2016  . Carotid artery disease (HCC) 12/23/2016  . CAD S/P percutaneous coronary angioplasty   . Unstable angina (HCC) 01/05/2015  . Dyslipidemia   . Hypertriglyceridemia   . Type 2 diabetes mellitus with complication, with long-term current use of insulin [facility])     Past Surgical History:  Procedure Laterality Date  . CARDIAC CATHETERIZATION  N/A 01/05/2015   Procedure: Left Heart Cath and Coronary Angiography;  Surgeon: Peter M Swaziland, MD;  Location: [facility] INVASIVE CV LAB;  Service: Cardiovascular;  Laterality: N/A;  . CARDIAC CATHETERIZATION  01/05/2015   Procedure: Coronary Stent Intervention;  Surgeon: Peter M Swaziland, MD;  Location: [facility] INVASIVE CV LAB;  Service: Cardiovascular;;  . CORONARY ANGIOPLASTY WITH STENT PLACEMENT  05/19/2012   DES   to RCA   by Dr Excell Seltzer  . CORONARY BALLOON ANGIOPLASTY N/A 04/02/2018   Procedure: CORONARY BALLOON ANGIOPLASTY;  Surgeon: Swaziland, Peter M, MD;  Location: [facility] INVASIVE CV LAB;  Service: Cardiovascular;  Laterality: N/A;  . CORONARY STENT INTERVENTION N/A 08/18/2017   Procedure:  CORONARY STENT INTERVENTION;  Surgeon: Marykay Lex, MD;  Location: [facility] INVASIVE CV LAB;  Service: Cardiovascular;  Laterality: N/A;  . CORONARY STENT PLACEMENT  01/05/2015   mid cx  des  . INTRAVASCULAR ULTRASOUND/IVUS N/A 04/02/2018   Procedure: Intravascular Ultrasound/IVUS;  Surgeon: Swaziland, Peter M, MD;  Location: [facility] INVASIVE CV LAB;  Service: Cardiovascular;  Laterality: N/A;  . LEFT HEART CATH AND CORONARY ANGIOGRAPHY N/A 08/18/2017   Procedure: LEFT HEART CATH AND CORONARY ANGIOGRAPHY;  Surgeon: Marykay Lex, MD;  Location: [facility] INVASIVE CV LAB;  Service: Cardiovascular;  Laterality: N/A;  . LEFT HEART CATH AND CORONARY ANGIOGRAPHY N/A 04/02/2018   Procedure: LEFT HEART CATH AND CORONARY ANGIOGRAPHY;  Surgeon: Swaziland, Peter M, MD;  Location: [facility] INVASIVE CV LAB;  Service: Cardiovascular;  Laterality: N/A;  . PERCUTANEOUS CORONARY STENT INTERVENTION (PCI-S) N/A 05/19/2012   Procedure: PERCUTANEOUS CORONARY STENT INTERVENTION (PCI-S);  Surgeon: Tonny Bollman, MD;  Location: [facility] CATH LAB;  Service: Cardiovascular;  Laterality: N/A;       Family History  Problem Relation Age of Onset  . Diabetes Mother   . Coronary artery disease Father 2  . Heart attack Father     Social History   Tobacco Use  . Smoking status: Never Smoker  . Smokeless tobacco: Never Used  Vaping Use  . Vaping Use: Never used  Substance Use Topics  . Alcohol use: Yes    Comment: rare  . Drug use: No    Home Medications Prior to Admission medications   Medication Sig Start Date End Date Taking? Authorizing Provider  ARTIFICIAL TEAR SOLUTION OP Place 1 drop into both eyes daily as needed (dry eyes).    [provider]  aspirin EC 81 MG EC tablet Take 1 tablet (81 mg total) by mouth daily. 01/06/15   Janetta Hora, PA-C  atorvastatin (LIPITOR) 80 MG tablet TAKE 1 TABLET BY MOUTH DAILY 06/24/19   Georgie Chard D, NP  clopidogrel (PLAVIX) 75 MG tablet TAKE 1 TABLET EVERY DAY 12/23/19    [name], MD  Dulaglutide (TRULICITY) 1.5 MG/0.5ML SOPN Inject 1.5 mg into the skin every Sunday.    [provider]  esomeprazole (NEXIUM) 40 MG capsule Take 1 capsule (40 mg total) by mouth daily. 02/19/20   Dartha Lodge, PA-C  Icosapent Ethyl 1 g CAPS Take 2 capsules (2 g total) by mouth 2 (two) times daily. 03/22/18   Azalee Course, PA  INVOKANA 100 MG TABS tablet TK 1 T PO D 06/16/18   [provider]  nitroGLYCERIN (NITROSTAT) 0.4 MG SL tablet PLACE 1 TABLET (0.4 MG TOTAL) UNDER THE TONGUE EVERY 5 (FIVE) MINUTES AS NEEDED FOR CHEST PAIN. 03/22/18   Azalee Course, PA  TRESIBA FLEXTOUCH 200 UNIT/ML SOPN Inject 40 Units as directed daily.  10/14/17  [provider]    Allergies    Rosuvastatin, Ampicillin, Metformin, and Penicillins  Review of Systems   Review of Systems  Constitutional: Negative for chills and fever.  HENT: Negative.   Eyes: Negative for visual disturbance.  Respiratory: Negative for cough and shortness of breath.   Cardiovascular: Positive for chest pain. Negative for palpitations and leg swelling.  Gastrointestinal: Negative for abdominal pain, diarrhea, nausea and vomiting.  Genitourinary: Negative for dysuria and frequency.  Musculoskeletal: Negative for arthralgias and myalgias.  Skin: Negative for color change and rash.  Neurological: Positive for light-headedness. Negative for dizziness and syncope.  All other systems reviewed and are negative.   Physical Exam Updated Vital Signs BP 102/72   Pulse 73   Temp 98.7 F (37.1 C) (Oral)   Resp 15   Ht  (1.651 m)   Wt 69.9 kg   SpO2 97%   BMI 25.63 kg/m   Physical Exam Vitals and nursing note reviewed.  Constitutional:      General: He is not in acute distress.    Appearance: He is well-developed. He is not ill-appearing or diaphoretic.     Comments: Well-appearing and in no distress  HENT:     Head: Normocephalic and atraumatic.  Eyes:     General:        Right  eye: No discharge.        Left eye: No discharge.  Cardiovascular:     Rate and Rhythm: Normal rate and regular rhythm.     Pulses:          Radial pulses are 2+ on the right side and 2+ on the left side.       Dorsalis pedis pulses are 2+ on the right side and 2+ on the left side.     Heart sounds: Normal heart sounds. No murmur heard.  No friction rub. No gallop.   Pulmonary:     Effort: Pulmonary effort is normal. No respiratory distress.     Breath sounds: Normal breath sounds. No wheezing or rales.     Comments: Respirations equal and unlabored, patient able to speak in full sentences, lungs clear to auscultation bilaterally  Chest:     Chest wall: No tenderness.  Abdominal:     General: Bowel sounds are normal. There is no distension.     Palpations: Abdomen is soft. There is no mass.     Tenderness: There is no abdominal tenderness. There is no guarding.     Comments: Abdomen soft, nondistended, nontender to palpation in all quadrants without guarding or peritoneal signs  Musculoskeletal:        General: No deformity.     Cervical back: Neck supple.     Right lower leg: No tenderness. No edema.     Left lower leg: No tenderness. No edema.  Skin:    General: Skin is warm and dry.     Capillary Refill: Capillary refill takes less than 2 seconds.  Neurological:     Mental Status: He is alert.     Coordination: Coordination normal.     Comments: Speech is clear, able to follow commands Moves extremities without ataxia, coordination intact  Psychiatric:        Mood and Affect: Mood normal.        Behavior: Behavior normal.     ED Results / Procedures / Treatments   Labs (all labs ordered are listed, but only abnormal results are displayed) Labs Reviewed  BASIC METABOLIC  PANEL - Abnormal; Notable for the following components:      Result Value   Glucose, Bld 176 (*)    All other components within normal limits  CBC - Abnormal; Notable for the following components:    HCT 37.4 (*)    All other components within normal limits  HEPATIC FUNCTION PANEL - Abnormal; Notable for the following components:   Total Bilirubin 1.4 (*)    Bilirubin, Direct 0.4 (*)    Indirect Bilirubin 1.0 (*)    All other components within normal limits  LIPASE, BLOOD  TROPONIN I (HIGH SENSITIVITY)  TROPONIN I (HIGH SENSITIVITY)    EKG EKG Interpretation  Date/Time:  Sunday February 19 2020 17:49:24 EST Ventricular Rate:  79 PR Interval:  152 QRS Duration: 88 QT Interval:  368 QTC Calculation: 421 R Axis:   -16 Text Interpretation: Normal sinus rhythm Normal ECG no ischemic changes compared to previous Confirmed by Frederick Peers [phone]) on 02/19/2020 8:52:46 PM   Radiology DG Chest 2 View  Result Date: 02/19/2020 CLINICAL DATA:  Chest pressure and shortness of breath. EXAM: CHEST - 2 VIEW COMPARISON:  08/19/2017 FINDINGS: Mild thoracic spondylosis. Coronary stents. Heart size within normal limits. The lungs appear clear. No blunting of the costophrenic angles. Mild thoracic spondylosis. IMPRESSION: 1. No active cardiopulmonary disease is radiographically apparent. 2. Coronary stents are noted. Electronically Signed   By: Gaylyn Rong M.D.   On: 02/19/2020 17:37    Procedures Procedures (including critical care time)  Medications Ordered in ED Medications  pantoprazole (PROTONIX) injection 40 mg (40 mg Intravenous Given 02/19/20 2021)  alum & mag hydroxide-simeth (MAALOX/MYLANTA) 200-200-20 MG/5ML suspension 30 mL (30 mLs Oral Given 02/19/20 2021)    And  lidocaine (XYLOCAINE) 2 % viscous mouth solution 15 mL (15 mLs Oral Given 02/19/20 2021)    ED Course  I have reviewed the triage vital signs and the nursing notes.  Pertinent labs & imaging results that were available during my care of the patient were reviewed by me and considered in my medical decision making (see chart for details).    MDM Rules/Calculators/A&P                         Patient  presents to the emergency department with chest pain. Patient nontoxic appearing, in no apparent distress, vitals without significant abnormality. Fairly benign physical exam.   Prior heart catheterization reviewed: 04/02/18, 3 vessel disease, currently managed with medication, but if pt has recurrent stenosis, may need CABG Patient's primary cardiologist: Dr. Antoine Poche  DDX: ACS, pulmonary embolism, dissection, pneumothorax, pneumonia, arrhythmia, severe anemia, MSK, GERD, anxiety. Evaluation initiated with labs, EKG, and CXR. Patient on cardiac monitor.   CBC: no leukocytosis, normal Hgb BMP: Glucose 176, no other electrolyte derangements and normal renal function Troponin: Neg x 2 EKG: NSR with no ischemic changes when compared to prior CXR: Negative, without infiltrate, effusion, pneumothorax, or fracture/dislocation.   EKG without obvious acute ischemia, delta troponin negative, doubt ACS. Patient is low risk wells, PERC negative, doubt pulmonary embolism. Pain is not a tearing sensation, symmetric pulses, no widening of mediastinum on CXR, doubt dissection. Cardiac monitor reviewed, no notable arrhythmias or tachycardia.Suspect pain may be due to esophageal spasm or GERD, symptoms reproduced with eating and drinking, will start on PPI and refer to GI. Patient has appeared hemodynamically stable throughout ER visit and appears safe for discharge with close PCP/cardiology follow up. I discussed results, treatment plan, need for PCP  follow-up, and return precautions with the patient. Provided opportunity for questions, patient confirmed understanding and is in agreement with plan.    Final Clinical Impression(s) / ED Diagnoses Final diagnoses:  Atypical chest pain    Rx / DC Orders ED Discharge Orders         Ordered    esomeprazole (NEXIUM) 40 MG capsule  Daily        02/19/20 2226           Dartha Lodge, [place] 02/21/20 1829    Little, Ambrose Finland, MD 03/11/20 [phone]

## 2020-02-19 NOTE — ED Notes (Signed)
Called pt x2 for room, no response. 

## 2020-02-19 NOTE — ED Triage Notes (Signed)
Pt to triage via [facility] from home.  On the way home from picking up chinese he started having chest pain.  Took 2 home NTG.  Reports SOB.  Denies nausea and diaphoresis.  Chest pain non-radiating 5/10.  NTG relieved pressure but then felt like he was going to pass out.  Normal EKG with EMS.  Refused ASA.  18 g LFA.

## 2020-02-19 NOTE — Discharge Instructions (Signed)
Your evaluation today was very reassuring, I suspect the symptoms may be more so related to esophageal spasm or acid reflux, especially since they are related to food and drink.  Please read instructions on dietary modifications and begin taking esomeprazole daily at least 30 minutes before your first meal of the day.  Your cardiac work-up was very reassuring today.  Please continue all your regular medications.  You can follow-up with your cardiologist regarding PVCs.  I would like for you to schedule a follow-up appointment with [facility] for further evaluation of your symptoms.

## 2020-02-29 ENCOUNTER — Encounter: Payer: Self-pay | Admitting: *Deleted

## 2020-02-29 ENCOUNTER — Emergency Department
Admission: EM | Admit: 2020-02-29 | Discharge: 2020-02-29 | Disposition: A | Discharge status: Home / Self Care | Payer: Managed Care, Other (non HMO) | Attending: Emergency Medicine | Admitting: Emergency Medicine

## 2020-02-29 ENCOUNTER — Other Ambulatory Visit: Payer: Self-pay

## 2020-02-29 ENCOUNTER — Emergency Department: Payer: Managed Care, Other (non HMO)

## 2020-02-29 DIAGNOSIS — R200 Anesthesia of skin: Secondary | ICD-10-CM | POA: Diagnosis not present

## 2020-02-29 DIAGNOSIS — Z5321 Procedure and treatment not carried out due to patient leaving prior to being seen by health care provider: Secondary | ICD-10-CM | POA: Insufficient documentation

## 2020-02-29 DIAGNOSIS — R0789 Other chest pain: Secondary | ICD-10-CM | POA: Diagnosis present

## 2020-02-29 LAB — CBC
HCT: 38.7 % — ABNORMAL LOW (ref 39.0–52.0)
Hemoglobin: 13.8 g/dL (ref 13.0–17.0)
MCH: 30.5 pg (ref 26.0–34.0)
MCHC: 35.7 g/dL (ref 30.0–36.0)
MCV: 85.4 fL (ref 80.0–100.0)
Platelets: 241 10*3/uL (ref 150–400)
RBC: 4.53 MIL/uL (ref 4.22–5.81)
RDW: 12.1 % (ref 11.5–15.5)
WBC: 11.5 10*3/uL — ABNORMAL HIGH (ref 4.0–10.5)
nRBC: 0 % (ref 0.0–0.2)

## 2020-02-29 LAB — BASIC METABOLIC PANEL
Anion gap: 12 (ref 5–15)
BUN: 12 mg/dL (ref 6–20)
CO2: 27 mmol/L (ref 22–32)
Calcium: 9.6 mg/dL (ref 8.9–10.3)
Chloride: 101 mmol/L (ref 98–111)
Creatinine, Ser: 0.74 mg/dL (ref 0.61–1.24)
GFR, Estimated: >60 mL/min (ref >60)
Glucose, Bld: 193 mg/dL — ABNORMAL HIGH (ref 70–99)
Potassium: 4 mmol/L (ref 3.5–5.1)
Sodium: 140 mmol/L (ref 135–145)

## 2020-02-29 LAB — TROPONIN I (HIGH SENSITIVITY)
Troponin I (High Sensitivity): 3 ng/L (ref <18)
Troponin I (High Sensitivity): 3 ng/L (ref <18)

## 2020-02-29 NOTE — ED Notes (Signed)
Rainbow sent to the lab.  

## 2020-02-29 NOTE — ED Triage Notes (Signed)
Pt to triage via wheelchair.  Pt has chest pressure and numbness in right leg. No sob.  Nonsmoker.  No cough.  Pt alert  Speech clear.

## 2020-03-05 ENCOUNTER — Telehealth: Payer: Self-pay | Admitting: Emergency Medicine

## 2020-03-05 NOTE — Telephone Encounter (Signed)
Called patient due to left emergency department before provider exam to inquire about condition and follow up plans. Left message. 

## 2020-03-06 NOTE — Progress Notes (Signed)
Cardiology Office Note   Date:  03/08/2020   ID:  Danny [name], DOB 06-16-1965, MRN [id number]  PCP:  Aliene Beams, MD  Cardiologist:   [name], MD   No chief complaint on file.     History of Present Illness: Danny [name] is a 54 y.o. male who presents for follow up of CAD.  He had a history of PCI to distal RCA in 2006. She had a severe in-stent restenosis by cardiac catheterization in February 2014 this was treated with a overlapping drug-eluting stent. Patient had a cardiac catheterization on 01/05/2015 that showed 95% mid left circumflex lesion treated with a drug-eluting stent. Echo obtained on 5/27 showed EF 65 to 70%, grade 1 DD, mild TR.  Cardiac catheterization obtained on 08/10/2017 showed 95% distal RCA lesion treated with 2.75 x 12 mm drug-eluting stent, EF 55 to 65%.  He was last in the hospital in Jan 2020 with unstable angina again.  He underwent cardiac catheterization with results as below.  He was found to have distal RCA stent restenosis.  He had PCI of the in-stent restenosis.  I had a teleheatlh visit with him in October.  He was in the emergency room in late November with chest discomfort.  This was felt to be not anginal.  Enzymes were negative.  EKG was nonacute.  Chest x-ray had no acute abnormalities.  I reviewed these records for this visit.  He was in the emergency room on 12 8 but left without being seen.  He actually has been feeling quite well.  He is vigorously exercising in the has been remodeling a bathroom.  He walks up and down stairs carrying things.  He cannot bring on any chest discomfort, neck or arm discomfort.  He describes the events as feeling like it is associated with food.  His heart skips a beat.  He feels lightheaded.  He is not describing substernal chest pressure, neck or arm discomfort.  There is been no new shortness of breath, PND or orthopnea.   Past Medical History:  Diagnosis Date  . CAD (coronary artery disease)     COSTAR study DES Stent to RCA 2006;   LHC  05/19/12: oLM 20%, LAD 40-50%, pD1 50%, mCFX 30-40%, mRCA stent with severe ISR and 70% beyond the stent, dRCA 50%, pPDA 50%, pPLA 30-40%.  EF 55-65%. =>  PCI:  Overlapping Promus DES x2 to the mid RCA ISR. [place] s/p DES to LCx  c.95% circ DES 10/16,  [place]   . CAD in native artery, hx of multiple PCIs 08/19/2017  . DM2 (diabetes mellitus, type 2) (HCC)   . GERD (gastroesophageal reflux disease)   . HLD (hyperlipidemia)   . Obesity     Past Surgical History:  Procedure Laterality Date  . CARDIAC CATHETERIZATION N/A 01/05/2015   Procedure: Left Heart Cath and Coronary Angiography;  Surgeon: Peter M Swaziland, MD;  Location: [facility] INVASIVE CV LAB;  Service: Cardiovascular;  Laterality: N/A;  . CARDIAC CATHETERIZATION  01/05/2015   Procedure: Coronary Stent Intervention;  Surgeon: Peter M Swaziland, MD;  Location: [facility] INVASIVE CV LAB;  Service: Cardiovascular;;  . CORONARY ANGIOPLASTY WITH STENT PLACEMENT  05/19/2012   DES   to RCA   by Dr Excell Seltzer  . CORONARY BALLOON ANGIOPLASTY N/A 04/02/2018   Procedure: CORONARY BALLOON ANGIOPLASTY;  Surgeon: Swaziland, Peter M, MD;  Location: [facility] INVASIVE CV LAB;  Service: Cardiovascular;  Laterality: N/A;  . CORONARY STENT INTERVENTION N/A 08/18/2017  Procedure: CORONARY STENT INTERVENTION;  Surgeon: Marykay Lex, MD;  Location: [facility] INVASIVE CV LAB;  Service: Cardiovascular;  Laterality: N/A;  . CORONARY STENT PLACEMENT  01/05/2015   mid cx  des  . INTRAVASCULAR ULTRASOUND/IVUS N/A 04/02/2018   Procedure: Intravascular Ultrasound/IVUS;  Surgeon: Swaziland, Peter M, MD;  Location: [facility] INVASIVE CV LAB;  Service: Cardiovascular;  Laterality: N/A;  . LEFT HEART CATH AND CORONARY ANGIOGRAPHY N/A 08/18/2017   Procedure: LEFT HEART CATH AND CORONARY ANGIOGRAPHY;  Surgeon: Marykay Lex, MD;  Location: [facility] INVASIVE CV LAB;  Service: Cardiovascular;  Laterality: N/A;  . LEFT HEART CATH AND CORONARY ANGIOGRAPHY N/A 04/02/2018   Procedure: LEFT  HEART CATH AND CORONARY ANGIOGRAPHY;  Surgeon: Swaziland, Peter M, MD;  Location: [facility] INVASIVE CV LAB;  Service: Cardiovascular;  Laterality: N/A;  . PERCUTANEOUS CORONARY STENT INTERVENTION (PCI-S) N/A 05/19/2012   Procedure: PERCUTANEOUS CORONARY STENT INTERVENTION (PCI-S);  Surgeon: Tonny Bollman, MD;  Location: [facility] CATH LAB;  Service: Cardiovascular;  Laterality: N/A;     Current Outpatient Medications  Medication Sig Dispense Refill  . ARTIFICIAL TEAR SOLUTION OP Place 1 drop into both eyes daily as needed (dry eyes).    Marland Kitchen aspirin EC 81 MG EC tablet Take 1 tablet (81 mg total) by mouth daily.    Marland Kitchen atorvastatin (LIPITOR) 80 MG tablet TAKE 1 TABLET BY MOUTH DAILY 90 tablet 3  . clopidogrel (PLAVIX) 75 MG tablet TAKE 1 TABLET EVERY DAY 90 tablet 1  . nitroGLYCERIN (NITROSTAT) 0.4 MG SL tablet PLACE 1 TABLET (0.4 MG TOTAL) UNDER THE TONGUE EVERY 5 (FIVE) MINUTES AS NEEDED FOR CHEST PAIN. 25 tablet 2  . TRESIBA FLEXTOUCH 200 UNIT/ML SOPN Inject 40 Units as directed daily.   5  . icosapent Ethyl (VASCEPA) 1 g capsule Take 2 capsules (2 g total) by mouth 2 (two) times daily. 360 capsule 3   No current facility-administered medications for this visit.    Allergies:   Rosuvastatin, Ampicillin, Metformin, and Penicillins    ROS:  Please see the history of present illness.   Otherwise, review of systems are positive for none.   All other systems are reviewed and negative.    PHYSICAL EXAM: VS:  BP 110/62   Pulse 78   Temp (!) 97.3 F (36.3 C)   Ht 5\' 5"  (1.651 m)   Wt 164 lb 12.8 oz (74.8 kg)   SpO2 96%   BMI 27.42 kg/m  , BMI Body mass index is 27.42 kg/m. GENERAL:  Well appearing NECK:  No jugular venous distention, waveform within normal limits, carotid upstroke brisk and symmetric, no bruits, no thyromegaly LUNGS:  Clear to auscultation bilaterally CHEST:  Unremarkable HEART:  PMI not displaced or sustained,S1 and S2 within normal limits, no S3, no S4, no clicks, no rubs, no  murmurs ABD:  Flat, positive bowel sounds normal in frequency in pitch, no bruits, no rebound, no guarding, no midline pulsatile mass, no hepatomegaly, no splenomegaly EXT:  2 plus pulses upper and diminished dorsalis pedis and posterior tibialis bilateral lower, no edema, no cyanosis no clubbing   KG:  EKG is not  ordered today. The ekg ordered 02/29/2020 demonstrates sinus rhythm, rate 69, leftward axis, no acute ST-T wave changes.   Recent Labs: 02/19/2020: ALT 20 02/29/2020: BUN 12; Creatinine, Ser 0.74; Hemoglobin 13.8; Platelets 241; Potassium 4.0; Sodium 140    Lipid Panel    Component Value Date/Time   CHOL 87 (L) 09/28/2018 0815   TRIG 157 (H) 09/28/2018 0815  HDL 28 (L) 09/28/2018 0815   CHOLHDL 3.1 09/28/2018 0815   CHOLHDL 7.4 08/16/2017 0320   VLDL UNABLE TO CALCULATE IF TRIGLYCERIDE OVER 400 mg/dL 76/22/6333 5456   LDLCALC 28 09/28/2018 0815   LDLDIRECT 38 09/28/2018 0815   LDLDIRECT 57.7 08/25/2012 0951      Wt Readings from Last 3 Encounters:  03/08/20 164 lb 12.8 oz (74.8 kg)  02/19/20 154 lb (69.9 kg)  12/30/18 153 lb (69.4 kg)      Other studies Reviewed: Additional studies/ records that were reviewed today include: ED records. Review of the above records demonstrates:  Please see elsewhere in the note.     ASSESSMENT AND PLAN:  CAD:  I did an extensive review of his ER.  I did review with him of his symptoms.  I went over the cath films looking at them in the office during this appointment.  His symptoms are not at all similar to what happened in January of last year.  The are nonanginal sounding.  At this point he and I have agreed that this may be something GI or other than cardiac but he will let me know if his symptoms change going forward.   DM:His A1c was 7.6.  He working closely with his primary provider.  He is having some trouble with the Trulicity which is on hold right now because of some GI complaints.  I will defer to their management.     DYSLIPIDEMIA: His LDL calculated is 78 with an HDL of 35.  He is on a good medical regimen.   CAROTID: He did have mild disease previously.  Is been almost 5 years since his last test.  I will order carotid Dopplers  PVD: He does have some reduced pulses and some leg discomfort.  I will order ABIs.     Current medicines are reviewed at length with the patient today.  The patient does not have concerns regarding medicines.  The following changes have been made:  no change  Labs/ tests ordered today include:   Orders Placed This Encounter  Procedures  . VAS US CAROTID  . VAS [place] ABI WITH/WO TBI     Disposition:   FU with me in six months.  He can   Signed, [name], MD  03/08/2020 12:23 PM    [facility]

## 2020-03-08 ENCOUNTER — Other Ambulatory Visit: Payer: Self-pay

## 2020-03-08 ENCOUNTER — Ambulatory Visit: Payer: Managed Care, Other (non HMO) | Admitting: Cardiology

## 2020-03-08 ENCOUNTER — Encounter: Payer: Self-pay | Admitting: Cardiology

## 2020-03-08 ENCOUNTER — Ambulatory Visit (INDEPENDENT_AMBULATORY_CARE_PROVIDER_SITE_OTHER): Payer: Managed Care, Other (non HMO) | Admitting: Cardiology

## 2020-03-08 VITALS — BP 110/62 | HR 78 | Temp 97.3°F | Ht 65.0 in | Wt 164.8 lb

## 2020-03-08 DIAGNOSIS — E785 Hyperlipidemia, unspecified: Secondary | ICD-10-CM | POA: Diagnosis not present

## 2020-03-08 DIAGNOSIS — I2510 Atherosclerotic heart disease of native coronary artery without angina pectoris: Secondary | ICD-10-CM | POA: Diagnosis not present

## 2020-03-08 DIAGNOSIS — M79605 Pain in left leg: Secondary | ICD-10-CM

## 2020-03-08 DIAGNOSIS — I6529 Occlusion and stenosis of unspecified carotid artery: Secondary | ICD-10-CM

## 2020-03-08 DIAGNOSIS — Z794 Long term (current) use of insulin: Secondary | ICD-10-CM

## 2020-03-08 DIAGNOSIS — E119 Type 2 diabetes mellitus without complications: Secondary | ICD-10-CM

## 2020-03-08 DIAGNOSIS — M79604 Pain in right leg: Secondary | ICD-10-CM

## 2020-03-08 DIAGNOSIS — E781 Pure hyperglyceridemia: Secondary | ICD-10-CM

## 2020-03-08 MED ORDER — ICOSAPENT ETHYL 1 G PO CAPS: 2.0000 g | ORAL_CAPSULE | Freq: Two times a day (BID) | ORAL | 3 refills | Status: AC

## 2020-03-08 NOTE — Patient Instructions (Signed)
Medication Instructions:  No changes *If you need a refill on your cardiac medications before your next appointment, please call your pharmacy*  Lab Work: None ordered this visit  Testing/Procedures: Your physician has requested that you have an ankle brachial index (ABI). During this test an ultrasound and blood pressure cuff are used to evaluate the arteries that supply the arms and legs with blood. Allow thirty minutes for this exam. There are no restrictions or special instructions.  Your physician has requested that you have a carotid duplex. This test is an ultrasound of the carotid arteries in your neck. It looks at blood flow through these arteries that supply the brain with blood. Allow one hour for this exam. There are no restrictions or special instructions.  Follow-Up: At [facility], you and your health needs are our priority.  As part of our continuing mission to provide you with exceptional heart care, we have created designated Provider Care Teams.  These Care Teams include your primary Cardiologist (physician) and Advanced Practice Providers (APPs -  Physician Assistants and Nurse Practitioners) who all work together to provide you with the care you need, when you need it.   Your next appointment:   6 month(s)  The format for your next appointment:   In Person  Provider:   [name], MD

## 2020-03-19 ENCOUNTER — Telehealth: Payer: Self-pay

## 2020-03-19 NOTE — Telephone Encounter (Signed)
   [facility] Pre-operative Risk Assessment    Request for surgical clearance:  1. What type of surgery is being performed? COLONOSCOPY  2. When is this surgery scheduled? 05-08-2020   3. What type of clearance is required (medical clearance vs. Pharmacy clearance to hold med vs. Both)? BOTH  4. Are there any medications that need to be held prior to surgery and how long? PLAVIX   5. Practice name and name of physician performing surgery? [facility]    6. What is the office phone number? [phone]   7.   What is the office fax number? [phone]  8.   Anesthesia type (None, local, MAC, general) ? NOT LISTED--USUALLY PROPOFOL??? [id number]

## 2020-03-19 NOTE — Telephone Encounter (Signed)
Seen by Dr. Antoine Poche 03/08/20. PMH of CAD with PCI to dRCA 2006 severe in-stent restenosis treated with overlapping DES 04/2012. LHC 12/2014 with DES to mid LCx. LHC 07/2017 with DES to dRCA. Hospitalized 03/2018 with angina, LHC with PCI to in-stent-restenosis of RCA. ED visit 01/2020 with atypical chest pain, negative troponin, EKG non acute. At last clinic visit noted good exercise tolerance and no recurrent chest pain. Concern for GI etiology of previous discomfort. Carotid duplex ordered for update. ABIs ordered for reduced pulses and leg discomfort. These are scheduled for 04/07/19.   Will route to Dr. Antoine Poche regarding recommendations for holding Plavix prior to colonoscopy 05/08/2020.  Alver Sorrow, NP

## 2020-03-19 NOTE — Telephone Encounter (Signed)
It will be OK to hold the Plavix as needed for the colonoscopy.

## 2020-03-20 NOTE — Telephone Encounter (Signed)
   Primary Cardiologist: [name], MD  Chart reviewed as part of pre-operative protocol coverage. Given past medical history and time since last visit, based on ACC/AHA guidelines, Danny [name] would be at acceptable risk for the planned procedure without further cardiovascular testing.   Per his primary cardiologist, Dr. Antoine Poche, he may hold Plavix as needed prior to colonoscopy. Educated patient to resume Plavix when told by gastroenterologist.   The patient was advised that if he develops new symptoms prior to surgery to contact our office to arrange for a follow-up visit, and he verbalized understanding.  I will route this recommendation to the requesting party via Epic fax function and remove from pre-op pool.  Please call with questions.  Alver Sorrow, NP 03/20/2020, 8:25 AM

## 2020-03-21 ENCOUNTER — Other Ambulatory Visit: Payer: Self-pay | Admitting: Cardiology

## 2020-03-21 DIAGNOSIS — M79605 Pain in left leg: Secondary | ICD-10-CM

## 2020-03-21 DIAGNOSIS — M79604 Pain in right leg: Secondary | ICD-10-CM

## 2020-04-06 ENCOUNTER — Ambulatory Visit (HOSPITAL_COMMUNITY): Payer: Managed Care, Other (non HMO)

## 2020-04-13 ENCOUNTER — Other Ambulatory Visit: Payer: Self-pay | Admitting: Cardiology

## 2020-04-13 ENCOUNTER — Ambulatory Visit (HOSPITAL_COMMUNITY)
Admission: RE | Admit: 2020-04-13 | Discharge: 2020-04-13 | Disposition: A | Discharge status: Home / Self Care | Payer: Managed Care, Other (non HMO) | Source: Ambulatory Visit | Attending: Cardiology | Admitting: Cardiology

## 2020-04-13 ENCOUNTER — Other Ambulatory Visit: Payer: Self-pay

## 2020-04-13 ENCOUNTER — Ambulatory Visit (HOSPITAL_BASED_OUTPATIENT_CLINIC_OR_DEPARTMENT_OTHER)
Admission: RE | Admit: 2020-04-13 | Discharge: 2020-04-13 | Disposition: A | Discharge status: Home / Self Care | Payer: Managed Care, Other (non HMO) | Source: Ambulatory Visit | Attending: Cardiology | Admitting: Cardiology

## 2020-04-13 DIAGNOSIS — R0989 Other specified symptoms and signs involving the circulatory and respiratory systems: Secondary | ICD-10-CM | POA: Diagnosis not present

## 2020-04-13 DIAGNOSIS — I6523 Occlusion and stenosis of bilateral carotid arteries: Secondary | ICD-10-CM

## 2020-04-13 DIAGNOSIS — M79604 Pain in right leg: Secondary | ICD-10-CM | POA: Insufficient documentation

## 2020-04-13 DIAGNOSIS — M79605 Pain in left leg: Secondary | ICD-10-CM | POA: Diagnosis not present

## 2020-04-13 DIAGNOSIS — I6529 Occlusion and stenosis of unspecified carotid artery: Secondary | ICD-10-CM | POA: Insufficient documentation

## 2020-09-11 NOTE — Progress Notes (Signed)
Cardiology Office Note   Date:  09/12/2020   ID:  Danny [name], DOB 1965/08/01, MRN [id number]  PCP:  Aliene Beams, MD  Cardiologist:   [name], MD   No chief complaint on file.     History of Present Illness: Danny [name] is a 55 y.o. male who presents for follow up of CAD.  He had a history of PCI to distal RCA in 2006.  She had a severe in-stent restenosis by cardiac catheterization in February 2014 this was treated with a overlapping drug-eluting stent.  Patient had a cardiac catheterization on 01/05/2015 that showed 95% mid left circumflex lesion treated with a drug-eluting stent.  Echo obtained on 5/27 showed EF 65 to 70%, grade 1 DD, mild TR.  Cardiac catheterization obtained on 08/10/2017 showed 95% distal RCA lesion treated with 2.75 x 12 mm drug-eluting stent, EF 55 to 65%.   He was last in the hospital in Jan 2020 with unstable angina again.  He underwent cardiac catheterization with results as below.  He was found to have distal RCA stent restenosis.  He had PCI of the in-stent restenosis.  He presents for follow up.  Since I last saw him he has done well.  The patient denies any new symptoms such as chest discomfort, neck or arm discomfort. There has been no new shortness of breath, PND or orthopnea. There have been no reported palpitations, presyncope or syncope.  He exercise routinely.  He did have some GI issues and had to have scopes.   Past Medical History:  Diagnosis Date   CAD (coronary artery disease)    COSTAR study DES Stent to RCA 2006;   LHC  05/19/12: oLM 20%, LAD 40-50%, pD1 50%, mCFX 30-40%, mRCA stent with severe ISR and 70% beyond the stent, dRCA 50%, pPDA 50%, pPLA 30-40%.  EF 55-65%. =>  PCI:  Overlapping Promus DES x2 to the mid RCA ISR. [place] s/p DES to LCx  c.95% circ DES 10/16,  [place]    CAD in native artery, hx of multiple PCIs 08/19/2017   DM2 (diabetes mellitus, type 2) (HCC)    GERD (gastroesophageal reflux disease)    HLD (hyperlipidemia)     Obesity     Past Surgical History:  Procedure Laterality Date   CARDIAC CATHETERIZATION N/A 01/05/2015   Procedure: Left Heart Cath and Coronary Angiography;  Surgeon: Peter M Swaziland, MD;  Location: [facility] INVASIVE CV LAB;  Service: Cardiovascular;  Laterality: N/A;   CARDIAC CATHETERIZATION  01/05/2015   Procedure: Coronary Stent Intervention;  Surgeon: Peter M Swaziland, MD;  Location: [facility] INVASIVE CV LAB;  Service: Cardiovascular;;   CORONARY ANGIOPLASTY WITH STENT PLACEMENT  05/19/2012   DES   to RCA   by Dr Excell Seltzer   CORONARY BALLOON ANGIOPLASTY N/A 04/02/2018   Procedure: CORONARY BALLOON ANGIOPLASTY;  Surgeon: Swaziland, Peter M, MD;  Location: [facility] INVASIVE CV LAB;  Service: Cardiovascular;  Laterality: N/A;   CORONARY STENT INTERVENTION N/A 08/18/2017   Procedure: CORONARY STENT INTERVENTION;  Surgeon: Marykay Lex, MD;  Location: [facility] INVASIVE CV LAB;  Service: Cardiovascular;  Laterality: N/A;   CORONARY STENT PLACEMENT  01/05/2015   mid cx  des   INTRAVASCULAR ULTRASOUND/IVUS N/A 04/02/2018   Procedure: Intravascular Ultrasound/IVUS;  Surgeon: Swaziland, Peter M, MD;  Location: [facility] INVASIVE CV LAB;  Service: Cardiovascular;  Laterality: N/A;   LEFT HEART CATH AND CORONARY ANGIOGRAPHY N/A 08/18/2017   Procedure: LEFT HEART CATH AND CORONARY ANGIOGRAPHY;  Surgeon: Herbie Baltimore,  Piedad Climes, MD;  Location: [facility] INVASIVE CV LAB;  Service: Cardiovascular;  Laterality: N/A;   LEFT HEART CATH AND CORONARY ANGIOGRAPHY N/A 04/02/2018   Procedure: LEFT HEART CATH AND CORONARY ANGIOGRAPHY;  Surgeon: Swaziland, Peter M, MD;  Location: [facility] INVASIVE CV LAB;  Service: Cardiovascular;  Laterality: N/A;   PERCUTANEOUS CORONARY STENT INTERVENTION (PCI-S) N/A 05/19/2012   Procedure: PERCUTANEOUS CORONARY STENT INTERVENTION (PCI-S);  Surgeon: Tonny Bollman, MD;  Location: [facility] CATH LAB;  Service: Cardiovascular;  Laterality: N/A;     Current Outpatient Medications  Medication Sig Dispense Refill   ARTIFICIAL TEAR SOLUTION OP  Place 1 drop into both eyes daily as needed (dry eyes).     aspirin EC 81 MG EC tablet Take 1 tablet (81 mg total) by mouth daily.     atorvastatin (LIPITOR) 80 MG tablet TAKE 1 TABLET BY MOUTH DAILY 90 tablet 3   clopidogrel (PLAVIX) 75 MG tablet TAKE 1 TABLET EVERY DAY 90 tablet 1   Continuous Blood Gluc Sensor (FREESTYLE LIBRE 14 DAY SENSOR) MISC SMARTSIG:1 Topical Every 2 Weeks     Continuous Blood Gluc Sensor (FREESTYLE LIBRE 14 DAY SENSOR) MISC See admin instructions.     doxycycline (VIBRAMYCIN) 100 MG capsule Take 100 mg by mouth 2 (two) times daily.     escitalopram (LEXAPRO) 10 MG tablet Take 1 tablet by mouth daily.     FARXIGA 10 MG TABS tablet Take 10 mg by mouth daily.     fenofibrate (TRICOR) 145 MG tablet Take 145 mg by mouth daily.     icosapent Ethyl (VASCEPA) 1 g capsule Take 2 capsules (2 g total) by mouth 2 (two) times daily. 360 capsule 3   ketoconazole (NIZORAL) 2 % cream Apply topically daily as needed.     metFORMIN (GLUCOPHAGE) 1000 MG tablet Take 1 tablet by mouth 2 (two) times daily.     nitroGLYCERIN (NITROSTAT) 0.4 MG SL tablet PLACE 1 TABLET (0.4 MG TOTAL) UNDER THE TONGUE EVERY 5 (FIVE) MINUTES AS NEEDED FOR CHEST PAIN. 25 tablet 2   TRESIBA FLEXTOUCH 200 UNIT/ML SOPN Inject 40 Units as directed daily.   5   Vitamin D, Ergocalciferol, (DRISDOL) 1.25 MG (50000 UNIT) CAPS capsule Take by mouth.     No current facility-administered medications for this visit.    Allergies:   Rosuvastatin, Ampicillin, Metformin, and Penicillins    ROS:  Please see the history of present illness.   Otherwise, review of systems are positive for none.   All other systems are reviewed and negative.    PHYSICAL EXAM: VS:  BP 114/71   Pulse 65   Ht 5\' 6"  (1.676 m)   Wt 160 lb (72.6 kg)   SpO2 97%   BMI 25.82 kg/m  , BMI Body mass index is 25.82 kg/m. GENERAL:  Well appearing NECK:  No jugular venous distention, waveform within normal limits, carotid upstroke brisk and  symmetric, no bruits, no thyromegaly LUNGS:  Clear to auscultation bilaterally CHEST:  Unremarkable HEART:  PMI not displaced or sustained,S1 and S2 within normal limits, no S3, no S4, no clicks, no rubs, no murmurs ABD:  Flat, positive bowel sounds normal in frequency in pitch, no bruits, no rebound, no guarding, no midline pulsatile mass, no hepatomegaly, no splenomegaly EXT:  2 plus pulses upper and mildly diminished dorsalis pedis and posterior tibialis bilateral lower, no edema, no cyanosis no clubbing    KG:  EKG is  not ordered today. The ekg ordered 02/29/2020 demonstrates Probable ectopic atrial  rhythm, rate 69, leftward axis, no acute ST-T wave changes.   Recent Labs: 02/19/2020: ALT 20 02/29/2020: BUN 12; Creatinine, Ser 0.74; Hemoglobin 13.8; Platelets 241; Potassium 4.0; Sodium 140    Lipid Panel    Component Value Date/Time   CHOL 87 (L) 09/28/2018 0815   TRIG 157 (H) 09/28/2018 0815   HDL 28 (L) 09/28/2018 0815   CHOLHDL 3.1 09/28/2018 0815   CHOLHDL 7.4 08/16/2017 0320   VLDL UNABLE TO CALCULATE IF TRIGLYCERIDE OVER 400 mg/dL 22/04/5425 0623   LDLCALC 28 09/28/2018 0815   LDLDIRECT 38 09/28/2018 0815   LDLDIRECT 57.7 08/25/2012 0951      Wt Readings from Last 3 Encounters:  09/12/20 160 lb (72.6 kg)  03/08/20 164 lb 12.8 oz (74.8 kg)  02/19/20 154 lb (69.9 kg)      Other studies Reviewed: Additional studies/ records that were reviewed today include:  Labs Review of the above records demonstrates:  Please see elsewhere in the note.     ASSESSMENT AND PLAN:  CAD:   The patient has no new sypmtoms.  No further cardiovascular testing is indicated.  We will continue with aggressive risk reduction and meds as listed.  DM:   His A1c was up to 8 but Marcelline Deist has been added.  I will defer to Aliene Beams, MD  DYSLIPIDEMIA:    His LDL calculated is 76 with HDL of 35.  Continue current therapy.  We talked about plant-based diet.   CAROTID:   He had 40 to 59%  right carotid stenosis in January.  I will repeat this at his next appointment.   PVD: He had normal ABIs last year.  No change in therapy  Current medicines are reviewed at length with the patient today.  The patient does not have concerns regarding medicines.  The following changes have been made: None  Labs/ tests ordered today include: None  No orders of the defined types were placed in this encounter.    Disposition:   FU with me in 12 months    Signed, [name], MD  09/12/2020 8:05 AM    [facility]

## 2020-09-12 ENCOUNTER — Encounter: Payer: Self-pay | Admitting: Cardiology

## 2020-09-12 ENCOUNTER — Other Ambulatory Visit: Payer: Self-pay

## 2020-09-12 ENCOUNTER — Ambulatory Visit (INDEPENDENT_AMBULATORY_CARE_PROVIDER_SITE_OTHER): Payer: Managed Care, Other (non HMO) | Admitting: Cardiology

## 2020-09-12 VITALS — BP 114/71 | HR 65 | Ht 66.0 in | Wt 160.0 lb

## 2020-09-12 DIAGNOSIS — E785 Hyperlipidemia, unspecified: Secondary | ICD-10-CM | POA: Diagnosis not present

## 2020-09-12 DIAGNOSIS — E118 Type 2 diabetes mellitus with unspecified complications: Secondary | ICD-10-CM | POA: Diagnosis not present

## 2020-09-12 DIAGNOSIS — I779 Disorder of arteries and arterioles, unspecified: Secondary | ICD-10-CM | POA: Diagnosis not present

## 2020-09-12 DIAGNOSIS — I2510 Atherosclerotic heart disease of native coronary artery without angina pectoris: Secondary | ICD-10-CM | POA: Diagnosis not present

## 2020-09-12 NOTE — Patient Instructions (Signed)
Medication Instructions:  Your physician recommends that you continue on your current medications as directed. Please refer to the Current Medication list given to you today.  *If you need a refill on your cardiac medications before your next appointment, please call your pharmacy*   Lab Work: None ordered.    Testing/Procedures: None ordered.    Follow-Up: At [facility], you and your health needs are our priority.  As part of our continuing mission to provide you with exceptional heart care, we have created designated Provider Care Teams.  These Care Teams include your primary Cardiologist (physician) and Advanced Practice Providers (APPs -  Physician Assistants and Nurse Practitioners) who all work together to provide you with the care you need, when you need it.  We recommend signing up for the patient portal called "MyChart".  Sign up information is provided on this After Visit Summary.  MyChart is used to connect with patients for Virtual Visits (Telemedicine).  Patients are able to view lab/test results, encounter notes, upcoming appointments, etc.  Non-urgent messages can be sent to your provider as well.   To learn more about what you can do with MyChart, go to [email or web address].    Your next appointment:   12 month(s)  The format for your next appointment:   In Person  Provider:   James Hochrein, MD     

## 2021-04-11 ENCOUNTER — Other Ambulatory Visit (HOSPITAL_COMMUNITY): Payer: Self-pay | Admitting: Cardiology

## 2021-04-11 DIAGNOSIS — I6523 Occlusion and stenosis of bilateral carotid arteries: Secondary | ICD-10-CM

## 2021-04-15 ENCOUNTER — Ambulatory Visit (HOSPITAL_COMMUNITY): Admission: RE | Admit: 2021-04-15 | Payer: Managed Care, Other (non HMO) | Source: Ambulatory Visit

## 2021-07-08 ENCOUNTER — Telehealth: Payer: Self-pay | Admitting: Cardiology

## 2021-07-08 NOTE — Telephone Encounter (Signed)
Patient requesting a referral [name], [place] Phone: [phone] ? ? ?

## 2021-07-10 ENCOUNTER — Other Ambulatory Visit: Payer: Self-pay

## 2021-07-10 DIAGNOSIS — I2510 Atherosclerotic heart disease of native coronary artery without angina pectoris: Secondary | ICD-10-CM

## 2021-07-10 NOTE — Telephone Encounter (Signed)
Referral placed- notified patient had moved and needed new cardiologist.  ? ?

## 2021-07-10 NOTE — Telephone Encounter (Signed)
Called, LVM to advise patient referral was placed- advised to let us know of any questions/concerns.  ? ? ?

## 2021-10-20 IMAGING — CR DG CHEST 2V
2 series · 2 of 2 positions shown · non-contrast
Comparison: 08/19/2017

CLINICAL DATA: Chest pressure and shortness of breath.

EXAM:
CHEST - 2 VIEW

[chest pa]
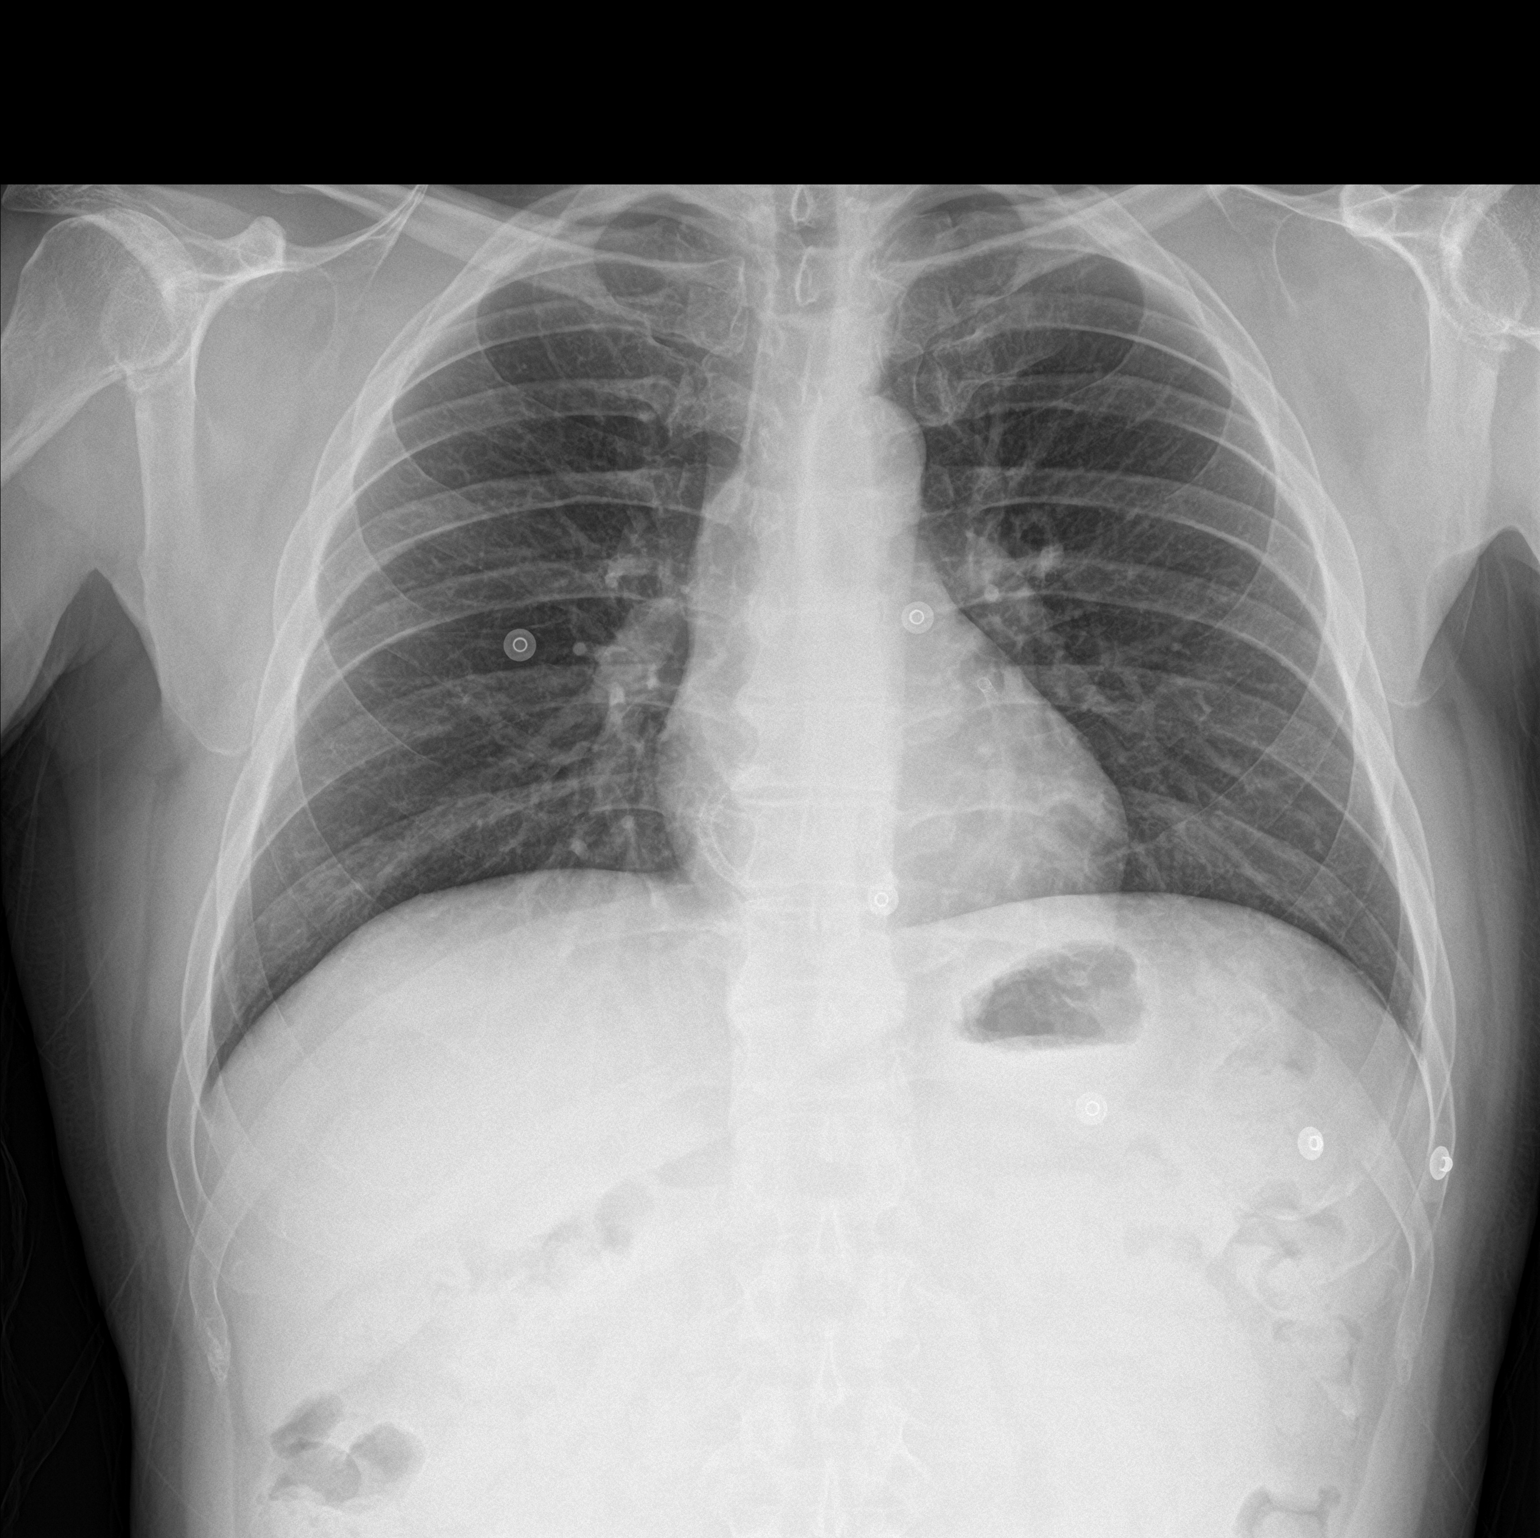

[chest lat]
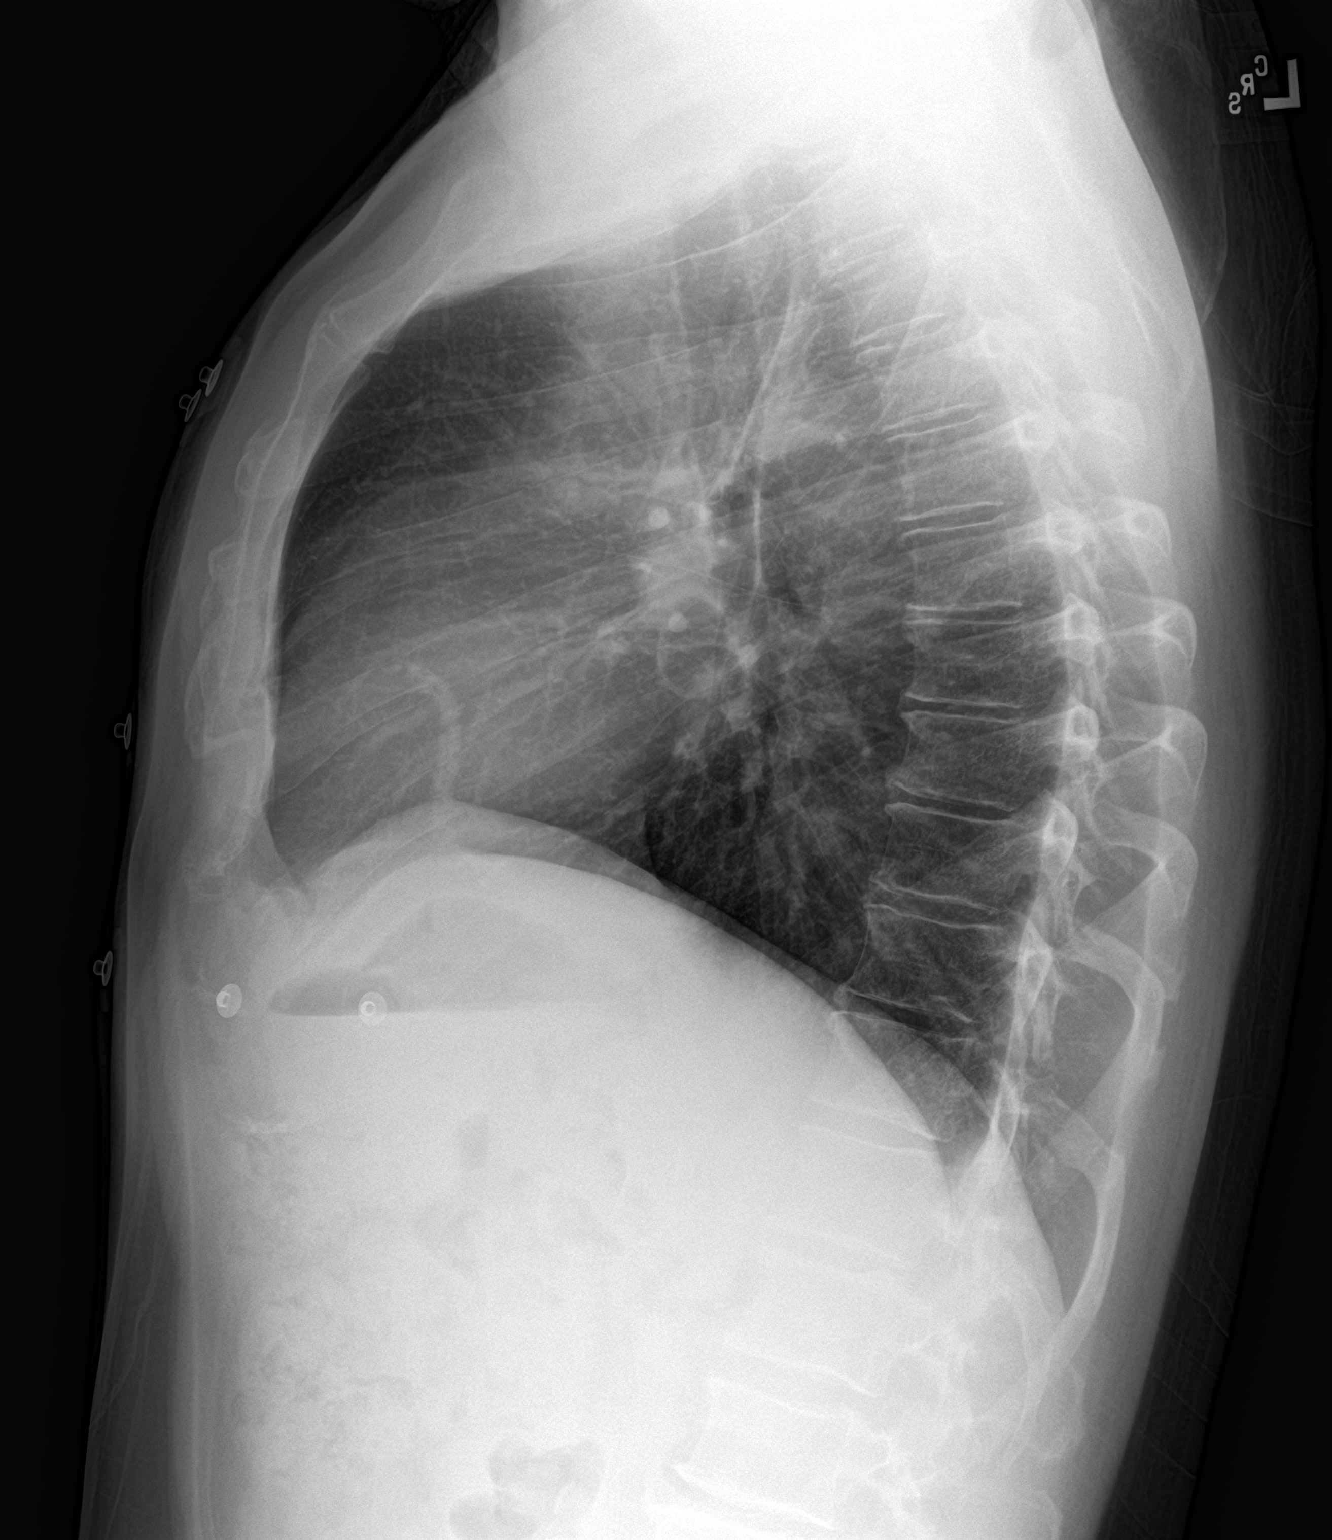

[2 of 2 positions shown; findings below may reference images not displayed]

FINDINGS: Mild thoracic spondylosis. Coronary stents. Heart size within normal
limits. The lungs appear clear. No blunting of the costophrenic
angles. Mild thoracic spondylosis.
IMPRESSION: 1. No active cardiopulmonary disease is radiographically apparent.
2. Coronary stents are noted.

## 2021-10-30 IMAGING — CR DG CHEST 2V
1 series · 2 of 2 positions shown · non-contrast
Comparison: 02/19/2020

CLINICAL DATA: Chest pain, intermittent chest pressure for 2 weeks

EXAM:
CHEST - 2 VIEW

[Series 1: dg chest 2 view · 0.14mm/px · 2 of 2 slices shown]
[im 1/2]
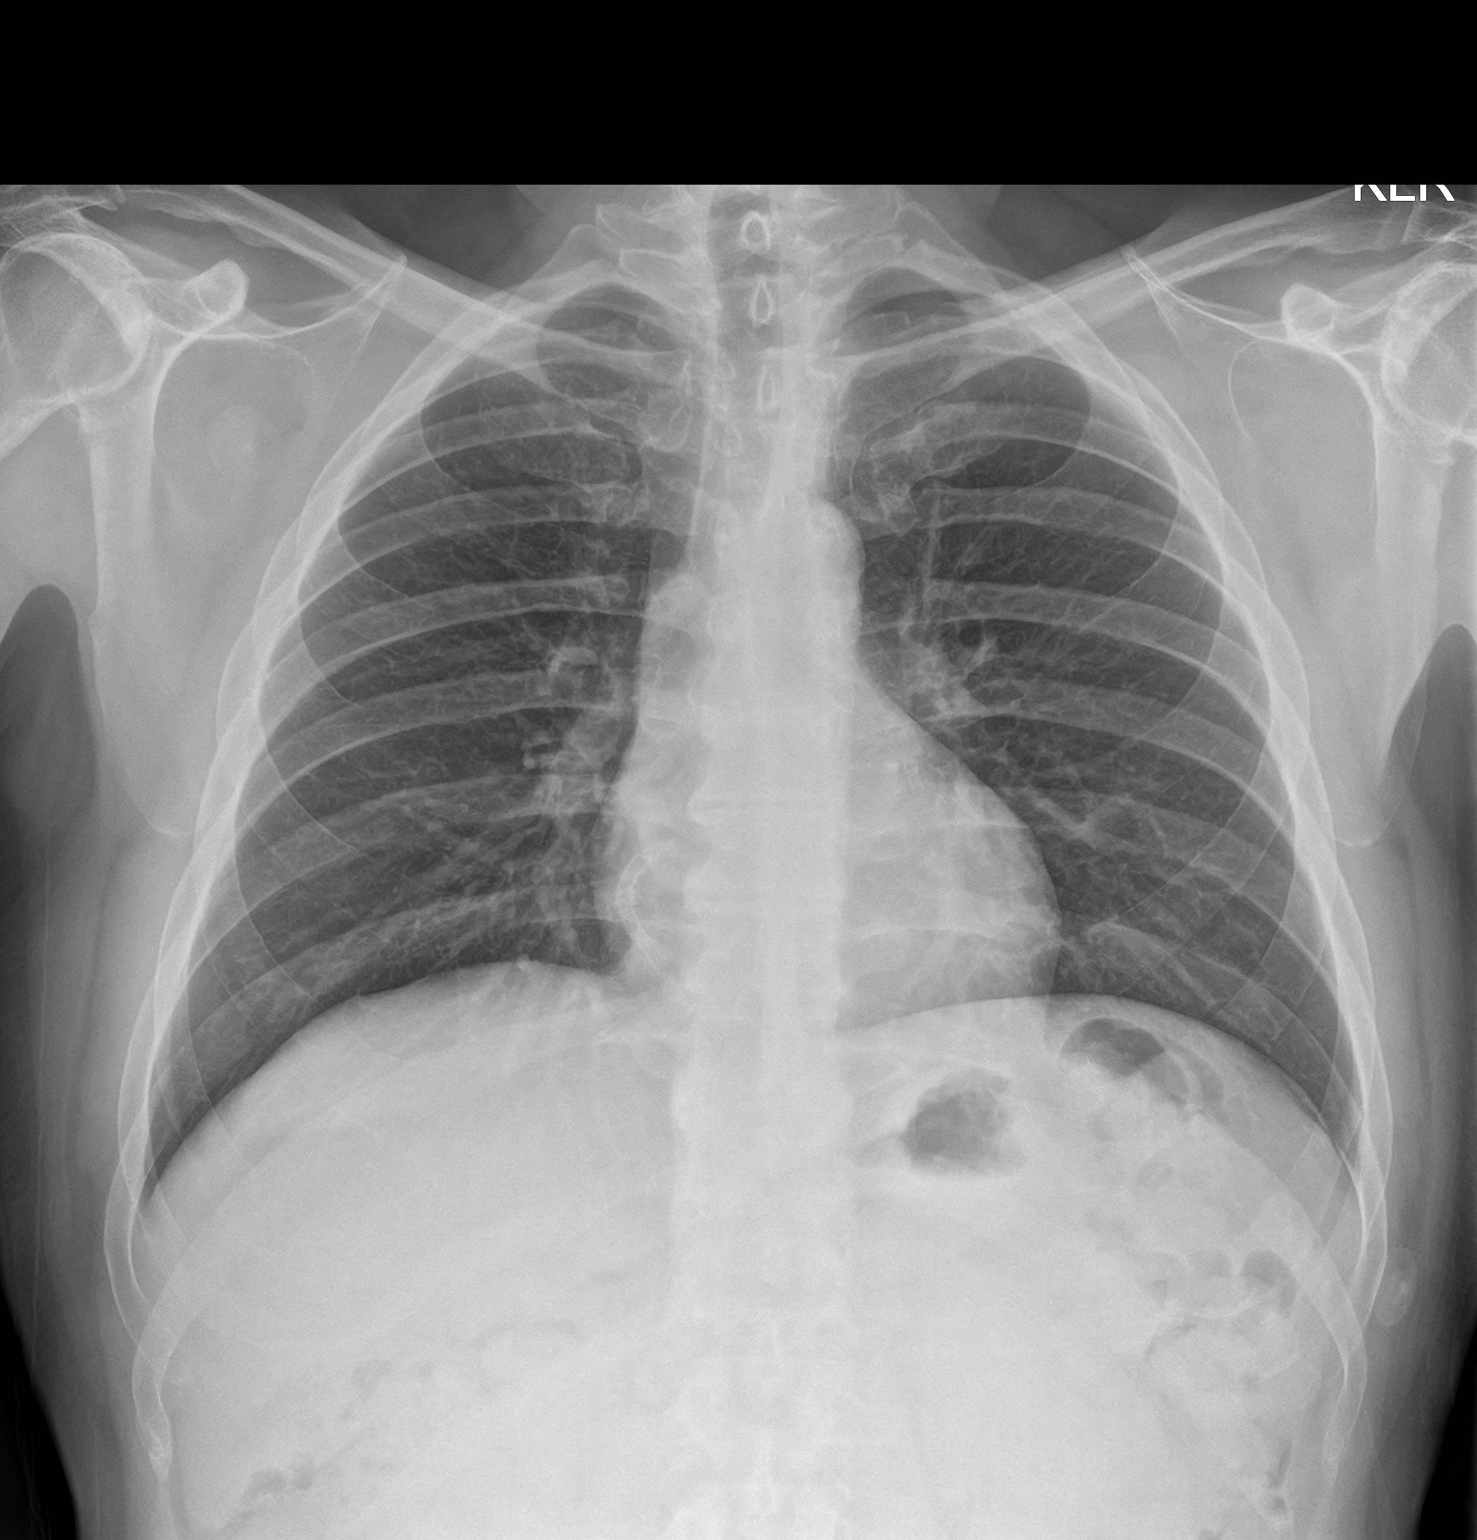
[im 2/2]
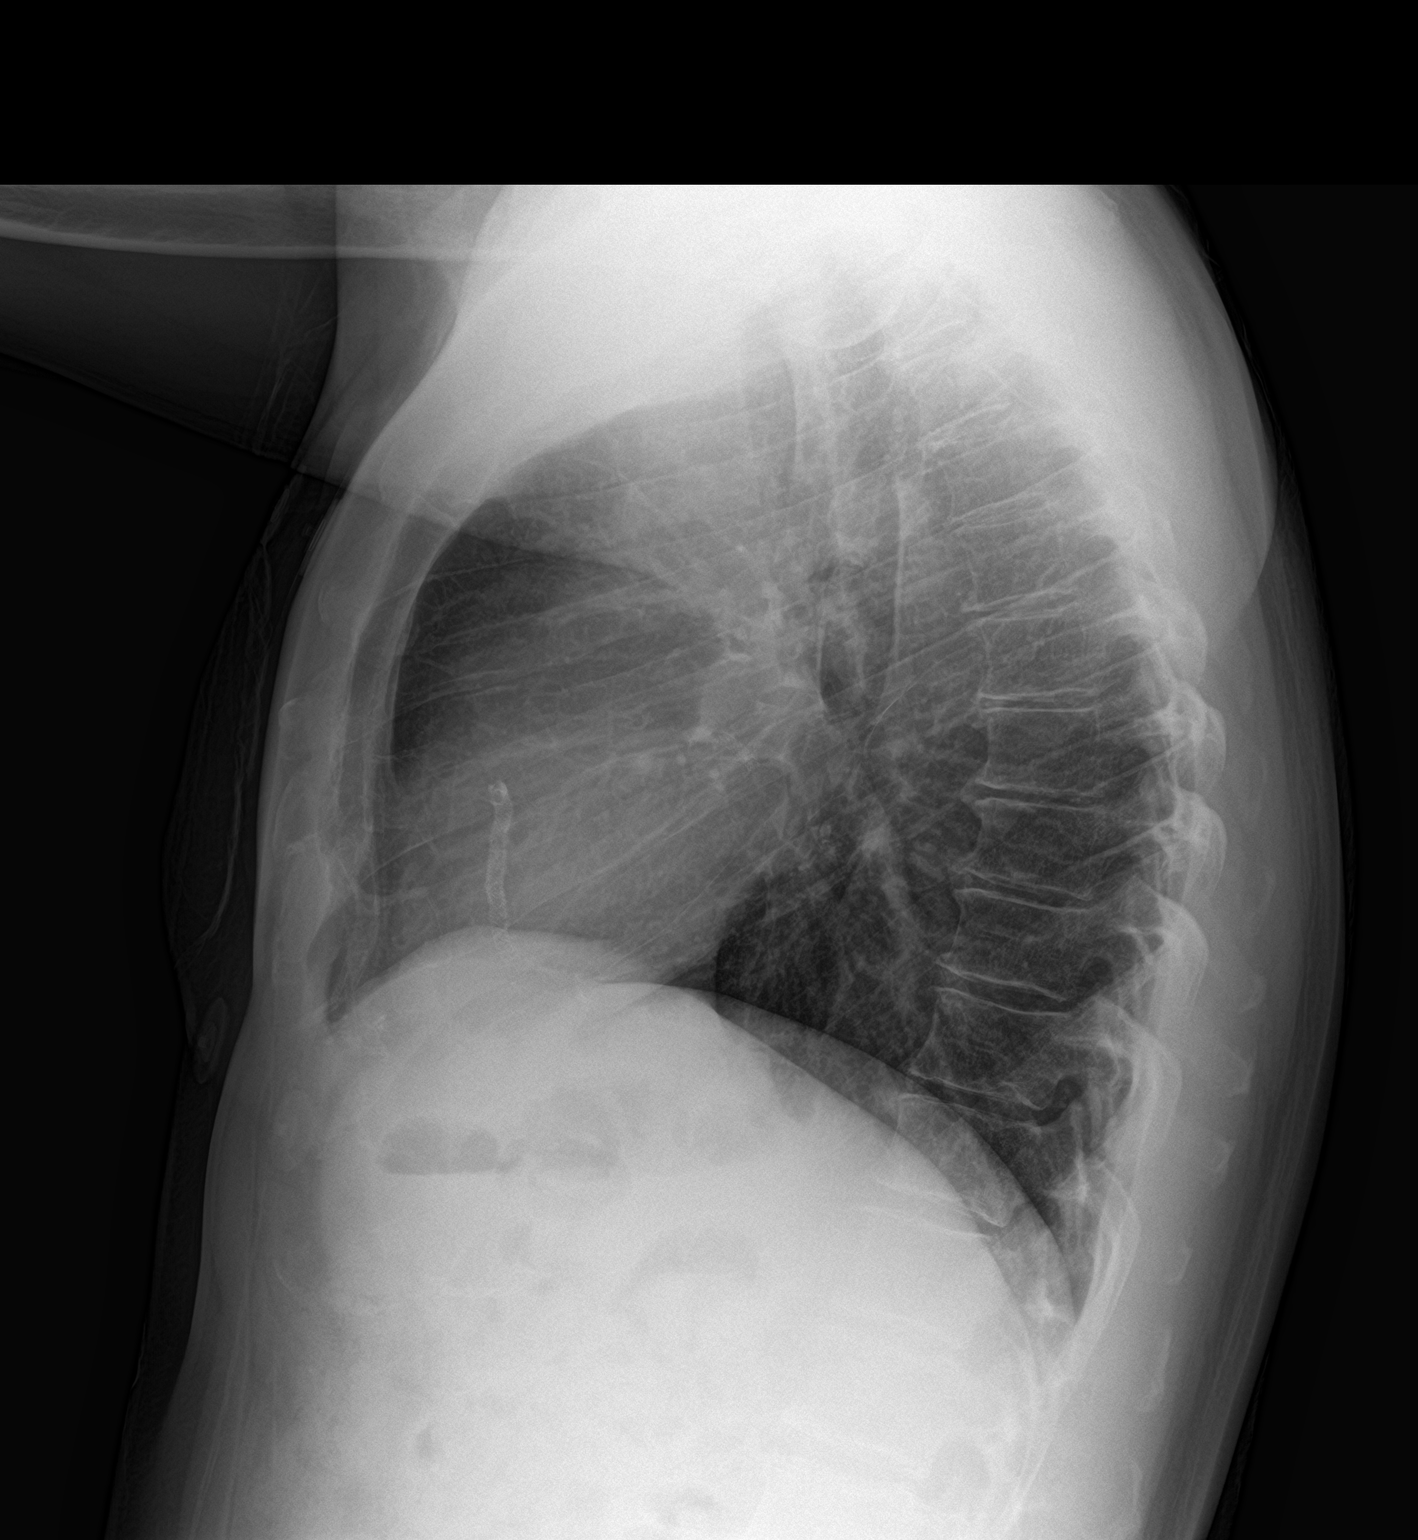

[2 of 2 positions shown; findings below may reference images not displayed]

FINDINGS: Normal heart size, mediastinal contours, and pulmonary vascularity.

Coronary arterial stents noted.

Lungs clear.

No infiltrate, pleural effusion or pneumothorax.

Osseous structures unremarkable.
IMPRESSION: Prior coronary stenting.

No acute abnormalities.

## 2022-01-13 DIAGNOSIS — Z955 Presence of coronary angioplasty implant and graft: Secondary | ICD-10-CM | POA: Insufficient documentation

## 2022-01-13 DIAGNOSIS — I10 Essential (primary) hypertension: Secondary | ICD-10-CM | POA: Insufficient documentation

## 2022-01-14 NOTE — Progress Notes (Unsigned)
Cardiology Office Note   Date:  01/16/2022   ID:  Danny [name], DOB [birth year], MRN [id number]  PCP:  Aliene Beams, MD  Cardiologist:   [name], MD   Chief Complaint  Patient presents with   Chest Pain       History of Present Illness: Danny [name] is a 56 y.o. male who presents for follow up of CAD.  He had a history of PCI to distal RCA in 2006.  She had a severe in-stent restenosis by cardiac catheterization in February 2014 this was treated with a overlapping drug-eluting stent.  Patient had a cardiac catheterization on 01/05/2015 that showed 95% mid left circumflex lesion treated with a drug-eluting stent.  Echo obtained on 5/27 showed EF 65 to 70%, grade 1 DD, mild TR.  Cardiac catheterization obtained on 08/10/2017 showed 95% distal RCA lesion treated with 2.75 x 12 mm drug-eluting stent, EF 55 to 65%.   He was last in the hospital in Jan 2020 with unstable angina again.  He underwent cardiac catheterization with results as below.  He was found to have distal RCA stent restenosis.  He had PCI of the in-stent restenosis.  He presents for follow up.  [place]  He is started noticing around March or so that he would get chest discomfort walking up an incline perhaps 100 yards.  This was new discomfort.  She went to [facility] and he had cardiac cath.  I have the report of this but not the actual films.  He was found to have occluded circumflex and they were unable to cross this.  The LAD had 50% mid stenosis.  RCA was dominant with multiple overlapping mid stents with diffuse in-stent restenosis.  The left circumflex apparently was crossed with a wire but they were unable to cross the chronic total occlusion in the distal edge of the stent at the OM 2.  There was a mid PTCA using a 2.0 x 10 mm semicompliant balloon.  The description is slightly confusing to be used.  He was managed with Ranexa.  He said he has continued to get this  discomfort walking up an incline but only now after he eats a meal.  However, he wants to be more active.  He wants to do more than he is capable of doing right now without getting the chest discomfort.  He said he did have improvement going from [phone] twice a day of Ranexa.  He has not taken frequent sublingual nitroglycerin.  He is not having any resting symptoms.  He has had no PND apnea.  He had no new palpitations, presyncope or syncope.  There was a mention of him having either surgery or further angioplasty if he had continued symptoms.  He was to come here for second opinion.   Past Medical History:  Diagnosis Date   CAD (coronary artery disease)    COSTAR study DES Stent to RCA 2006;   LHC  05/19/12: oLM 20%, LAD 40-50%, pD1 50%, mCFX 30-40%, mRCA stent with severe ISR and 70% beyond the stent, dRCA 50%, pPDA 50%, pPLA 30-40%.  EF 55-65%. =>  PCI:  Overlapping Promus DES x2 to the mid RCA ISR. [place] s/p DES to LCx  c.95% circ DES 10/16,  [place]    CAD in native artery, hx of multiple PCIs 08/19/2017   DM2 (diabetes mellitus, type 2) (HCC)    GERD (gastroesophageal reflux disease)  HLD (hyperlipidemia)    Obesity     Past Surgical History:  Procedure Laterality Date   CARDIAC CATHETERIZATION N/A 01/05/2015   Procedure: Left Heart Cath and Coronary Angiography;  Surgeon: Peter M Swaziland, MD;  Location: [facility] INVASIVE CV LAB;  Service: Cardiovascular;  Laterality: N/A;   CARDIAC CATHETERIZATION  01/05/2015   Procedure: Coronary Stent Intervention;  Surgeon: Peter M [place], MD;  Location: [facility] INVASIVE CV LAB;  Service: Cardiovascular;;   CORONARY ANGIOPLASTY WITH STENT PLACEMENT  05/19/2012   DES   to RCA   by Dr Excell Seltzer   CORONARY BALLOON ANGIOPLASTY N/A 04/02/2018   Procedure: CORONARY BALLOON ANGIOPLASTY;  Surgeon: Swaziland, Peter M, MD;  Location: [facility] INVASIVE CV LAB;  Service: Cardiovascular;  Laterality: N/A;   CORONARY STENT INTERVENTION N/A 08/18/2017   Procedure: CORONARY STENT  INTERVENTION;  Surgeon: Marykay Lex, MD;  Location: [facility] INVASIVE CV LAB;  Service: Cardiovascular;  Laterality: N/A;   CORONARY STENT PLACEMENT  01/05/2015   mid cx  des   INTRAVASCULAR ULTRASOUND/IVUS N/A 04/02/2018   Procedure: Intravascular Ultrasound/IVUS;  Surgeon: Swaziland, Peter M, MD;  Location: [facility] INVASIVE CV LAB;  Service: Cardiovascular;  Laterality: N/A;   LEFT HEART CATH AND CORONARY ANGIOGRAPHY N/A 08/18/2017   Procedure: LEFT HEART CATH AND CORONARY ANGIOGRAPHY;  Surgeon: Marykay Lex, MD;  Location: [facility] INVASIVE CV LAB;  Service: Cardiovascular;  Laterality: N/A;   LEFT HEART CATH AND CORONARY ANGIOGRAPHY N/A 04/02/2018   Procedure: LEFT HEART CATH AND CORONARY ANGIOGRAPHY;  Surgeon: Swaziland, Peter M, MD;  Location: [facility] INVASIVE CV LAB;  Service: Cardiovascular;  Laterality: N/A;   PERCUTANEOUS CORONARY STENT INTERVENTION (PCI-S) N/A 05/19/2012   Procedure: PERCUTANEOUS CORONARY STENT INTERVENTION (PCI-S);  Surgeon: Tonny Bollman, MD;  Location: [facility] CATH LAB;  Service: Cardiovascular;  Laterality: N/A;     Current Outpatient Medications  Medication Sig Dispense Refill   ARTIFICIAL TEAR SOLUTION OP Place 1 drop into both eyes daily as needed (dry eyes).     aspirin EC 81 MG EC tablet Take 1 tablet (81 mg total) by mouth daily.     atorvastatin (LIPITOR) 80 MG tablet TAKE 1 TABLET BY MOUTH DAILY 90 tablet 3   clopidogrel (PLAVIX) 75 MG tablet TAKE 1 TABLET EVERY DAY 90 tablet 1   Continuous Blood Gluc Sensor (FREESTYLE LIBRE 14 DAY SENSOR) MISC SMARTSIG:1 Topical Every 2 Weeks     Continuous Blood Gluc Sensor (FREESTYLE LIBRE 14 DAY SENSOR) MISC See admin instructions.     doxycycline (VIBRAMYCIN) 100 MG capsule Take 100 mg by mouth 2 (two) times daily.     escitalopram (LEXAPRO) 10 MG tablet Take 1 tablet by mouth daily.     FARXIGA 10 MG TABS tablet Take 10 mg by mouth daily.     fenofibrate (TRICOR) 145 MG tablet Take 145 mg by mouth daily.     icosapent Ethyl (VASCEPA) 1 g  capsule Take 2 capsules (2 g total) by mouth 2 (two) times daily. 360 capsule 3   isosorbide mononitrate (IMDUR) 60 MG 24 hr tablet Take 1 tablet (60 mg total) by mouth daily. 90 tablet 3   ketoconazole (NIZORAL) 2 % cream Apply topically daily as needed.     metFORMIN (GLUCOPHAGE) 1000 MG tablet Take 1 tablet by mouth 2 (two) times daily.     nitroGLYCERIN (NITROSTAT) 0.4 MG SL tablet PLACE 1 TABLET (0.4 MG TOTAL) UNDER THE TONGUE EVERY 5 (FIVE) MINUTES AS NEEDED FOR CHEST PAIN. 25 tablet 2   ranolazine (  RANEXA) 500 MG 12 hr tablet Take 1,000 mg by mouth 2 (two) times daily.     TRESIBA FLEXTOUCH 200 UNIT/ML SOPN Inject 40 Units as directed daily.   5   Vitamin D, Ergocalciferol, (DRISDOL) 1.25 MG (50000 UNIT) CAPS capsule Take by mouth.     No current facility-administered medications for this visit.    Allergies:   Rosuvastatin, Ampicillin, Metformin, and Penicillins    ROS:  Please see the history of present illness.   Otherwise, review of systems are positive for none.   All other systems are reviewed and negative.    PHYSICAL EXAM: VS:  BP 118/80   Pulse 78   Ht 5\' 6"  (1.676 m)   Wt 166 lb 9.6 oz (75.6 kg)   SpO2 96%   BMI 26.89 kg/m  , BMI Body mass index is 26.89 kg/m. GENERAL:  Well appearing NECK:  No jugular venous distention, waveform within normal limits, carotid upstroke brisk and symmetric, no bruits, no thyromegaly LUNGS:  Clear to auscultation bilaterally CHEST:  Unremarkable HEART:  PMI not displaced or sustained,S1 and S2 within normal limits, no S3, no S4, no clicks, no rubs, no murmurs ABD:  Flat, positive bowel sounds normal in frequency in pitch, no bruits, no rebound, no guarding, no midline pulsatile mass, no hepatomegaly, no splenomegaly EXT:  2 plus pulses throughout, no edema, no cyanosis no clubbing   EKG:  EKG is  ordered today. The ekg ordered  demonstrates Probable ectopic atrial  rhythm, rate 78, leftward axis, no acute ST-T wave  changes.   Recent Labs: No results found for requested labs within last 365 days.    Lipid Panel    Component Value Date/Time   CHOL 87 (L) 09/28/2018 0815   TRIG 157 (H) 09/28/2018 0815   HDL 28 (L) 09/28/2018 0815   CHOLHDL 3.1 09/28/2018 0815   CHOLHDL 7.4 08/16/2017 0320   VLDL UNABLE TO CALCULATE IF TRIGLYCERIDE OVER 400 mg/dL 08/18/2017 10/93/2355   LDLCALC 28 09/28/2018 0815   LDLDIRECT 38 09/28/2018 0815   LDLDIRECT 57.7 08/25/2012 0951      Wt Readings from Last 3 Encounters:  01/16/22 166 lb 9.6 oz (75.6 kg)  09/12/20 160 lb (72.6 kg)  03/08/20 164 lb 12.8 oz (74.8 kg)      Other studies Reviewed: Additional studies/ records that were reviewed today include: Records from the outside hospital Review of the above records demonstrates:  Please see elsewhere in the note.     ASSESSMENT AND PLAN:  CAD:   Today I am going to start him on Imdur 60 mg.  I am going to get the cath films and look at them with my partners.  He likely will need further revascularization based on this review.   DM:   His A1c was 6.9 late last year which was down from 8.  I will defer to his primary provider.   DYSLIPIDEMIA:    His LDL is to be measured again and I have asked him to add an LP(a).   CAROTID:   He had 40 to 59% right carotid stenosis in January 2021.  I will arrange follow-up.   Current medicines are reviewed at length with the patient today.  The patient does not have concerns regarding medicines.  The following changes have been made: Aas above  Labs/ tests ordered today include:    Orders Placed This Encounter  Procedures   EKG 12-Lead      Disposition:   FU with  me in one month    Signed, Minus Breeding, MD  01/16/2022 12:04 PM    [place]

## 2022-01-16 ENCOUNTER — Encounter: Payer: Self-pay | Admitting: Cardiology

## 2022-01-16 ENCOUNTER — Ambulatory Visit: Payer: Managed Care, Other (non HMO) | Attending: Cardiology | Admitting: Cardiology

## 2022-01-16 ENCOUNTER — Ambulatory Visit
Admission: RE | Admit: 2022-01-16 | Discharge: 2022-01-16 | Disposition: A | Discharge status: Home / Self Care | Payer: Self-pay | Source: Ambulatory Visit | Attending: Cardiology | Admitting: Cardiology

## 2022-01-16 VITALS — BP 118/80 | HR 78 | Ht 66.0 in | Wt 166.6 lb

## 2022-01-16 DIAGNOSIS — I6529 Occlusion and stenosis of unspecified carotid artery: Secondary | ICD-10-CM

## 2022-01-16 DIAGNOSIS — E118 Type 2 diabetes mellitus with unspecified complications: Secondary | ICD-10-CM | POA: Diagnosis not present

## 2022-01-16 DIAGNOSIS — E785 Hyperlipidemia, unspecified: Secondary | ICD-10-CM | POA: Diagnosis not present

## 2022-01-16 DIAGNOSIS — I200 Unstable angina: Secondary | ICD-10-CM

## 2022-01-16 DIAGNOSIS — I2510 Atherosclerotic heart disease of native coronary artery without angina pectoris: Secondary | ICD-10-CM

## 2022-01-16 MED ORDER — ISOSORBIDE MONONITRATE ER 60 MG PO TB24: 60.0000 mg | ORAL_TABLET | Freq: Every day | ORAL | 3 refills | Status: DC

## 2022-01-16 NOTE — Patient Instructions (Signed)
Medication Instructions:  Your physician has recommended you make the following change in your medication:   -Start isosorbide mononitrate (imdur) 60mg  once daily.    *If you need a refill on your cardiac medications before your next appointment, please call your pharmacy*   Follow-Up: At [facility], you and your health needs are our priority.  As part of our continuing mission to provide you with exceptional heart care, we have created designated Provider Care Teams.  These Care Teams include your primary Cardiologist (physician) and Advanced Practice Providers (APPs -  Physician Assistants and Nurse Practitioners) who all work together to provide you with the care you need, when you need it.  We recommend signing up for the patient portal called "MyChart".  Sign up information is provided on this After Visit Summary.  MyChart is used to connect with patients for Virtual Visits (Telemedicine).  Patients are able to view lab/test results, encounter notes, upcoming appointments, etc.  Non-urgent messages can be sent to your provider as well.   To learn more about what you can do with MyChart, go to [email or web address].    Your next appointment:   1 month(s)  The format for your next appointment:   In Person  Provider:   Minus Breeding, MD     Other Instructions [facility] is the lab that Dr. Percival Spanish would like to see the next time your have lab work done.

## 2022-01-16 NOTE — Addendum Note (Signed)
Addended by: Sunday Shams on: 01/16/2022 12:59 PM   Modules accepted: Orders

## 2022-01-17 ENCOUNTER — Other Ambulatory Visit: Payer: Self-pay | Admitting: Cardiology

## 2022-01-17 ENCOUNTER — Ambulatory Visit
Admission: RE | Admit: 2022-01-17 | Discharge: 2022-01-17 | Disposition: A | Discharge status: Home / Self Care | Payer: Self-pay | Source: Ambulatory Visit | Attending: Cardiology | Admitting: Cardiology

## 2022-01-17 DIAGNOSIS — I2510 Atherosclerotic heart disease of native coronary artery without angina pectoris: Secondary | ICD-10-CM

## 2022-01-17 DIAGNOSIS — I25709 Atherosclerosis of coronary artery bypass graft(s), unspecified, with unspecified angina pectoris: Secondary | ICD-10-CM

## 2022-01-17 NOTE — Addendum Note (Signed)
Addended by: Sunday Shams on: 01/17/2022 11:55 AM   Modules accepted: Orders

## 2022-01-31 ENCOUNTER — Telehealth: Payer: Self-pay

## 2022-01-31 ENCOUNTER — Telehealth: Payer: Self-pay | Admitting: Cardiology

## 2022-01-31 DIAGNOSIS — I200 Unstable angina: Secondary | ICD-10-CM

## 2022-01-31 NOTE — Telephone Encounter (Signed)
Patient called in to relay that he has to change cath from 11/30 to 12/7. The date was changed with the same time of noon. Patient has appointment already set up with Dr. Antoine Poche for 11/29. He can get EKG and blood work at that time.

## 2022-01-31 NOTE — Telephone Encounter (Signed)
Spoke with patient to inform him that Dr. Antoine Poche has ordered a cardiac cath to be performed by Dr. Clifton James. Patient wanted it on 02/20/22. It is scheduled for 02/20/22 at noon. Patient informed that once paperwork is in order, it will be reviewed with him. Patient wants to be called back on work phone at [phone]. CBC, BMP, EKG ordered.

## 2022-01-31 NOTE — Telephone Encounter (Signed)
I spoke with the patient.  I have reviewed his films with Dr. Clifton Salimah Martinovich.  The patient likely would benefit from CABG given his increasing exertional (unstable) angina.  However, it is difficult to assess his coronaries with the films that were done at the outside hospital.  We need to understand whether he has reasonable vessels as targets for bypass.  I will schedule him to have another cardiac cath.  I discussed this with patient. The patient understands that risks included but are not limited to stroke (1 in 1000), death (1 in 1000), kidney failure [usually temporary] (1 in 500), bleeding (1 in 200), allergic reaction [possibly serious] (1 in 200).  The patient understands and agrees to proceed.

## 2022-01-31 NOTE — Telephone Encounter (Deleted)
[place] [facility] A DEPT OF [place]. [facility] [address] A DEPT OF [place]. [facility] [address] [phone] [facility] [place] [address] Dept: [phone] Loc: [phone]  Danny [name]  01/31/2022  You are scheduled for a Cardiac Catheterization on Thursday, November 30 with Dr. Verne Carrow.  1. Please arrive at the [facility] (Main Entrance A) at [facility]: [address] [place], [place] [address] at 10:00 AM (This time is two hours before your procedure to ensure your preparation). Free valet parking service is available.   Special note: Every effort is made to have your procedure done on time. Please understand that emergencies sometimes delay scheduled procedures.  2. Diet: Do not eat solid foods after midnight.  The patient may have clear liquids until 5am upon the day of the procedure.  3. Labs: You will need to have blood drawn on , November 16 at [facility] [address], [place]  Open: 8am - 5pm (Lunch 12:30 - 1:30)   Phone: [phone]. You do not need to be fasting.  4. Medication instructions in preparation for your procedure:   Contrast Allergy: No  Current Outpatient Medications (Endocrine & Metabolic):    FARXIGA 10 MG TABS tablet, Take 10 mg by mouth daily.   metFORMIN (GLUCOPHAGE) 1000 MG tablet, Take 1 tablet by mouth 2 (two) times daily.   TRESIBA FLEXTOUCH 200 UNIT/ML SOPN, Inject 40 Units as directed daily.   Current Outpatient Medications (Cardiovascular):    atorvastatin (LIPITOR) 80 MG tablet, TAKE 1 TABLET BY MOUTH DAILY   fenofibrate (TRICOR) 145 MG tablet, Take 145 mg by mouth daily.   icosapent Ethyl (VASCEPA) 1 g capsule, Take 2 capsules (2 g total) by mouth 2 (two) times daily.   isosorbide mononitrate (IMDUR) 60 MG 24 hr tablet, Take 1 tablet (60 mg total) by mouth daily.   nitroGLYCERIN (NITROSTAT) 0.4 MG SL tablet, PLACE 1 TABLET (0.4 MG TOTAL)  UNDER THE TONGUE EVERY 5 (FIVE) MINUTES AS NEEDED FOR CHEST PAIN.   ranolazine (RANEXA) 500 MG 12 hr tablet, Take 1,000 mg by mouth 2 (two) times daily.   Current Outpatient Medications (Analgesics):    aspirin EC 81 MG EC tablet, Take 1 tablet (81 mg total) by mouth daily.  Current Outpatient Medications (Hematological):    clopidogrel (PLAVIX) 75 MG tablet, TAKE 1 TABLET EVERY DAY  Current Outpatient Medications (Other):    ARTIFICIAL TEAR SOLUTION OP, Place 1 drop into both eyes daily as needed (dry eyes).   Continuous Blood Gluc Sensor (FREESTYLE LIBRE 14 DAY SENSOR) MISC, SMARTSIG:1 Topical Every 2 Weeks   Continuous Blood Gluc Sensor (FREESTYLE LIBRE 14 DAY SENSOR) MISC, See admin instructions.   doxycycline (VIBRAMYCIN) 100 MG capsule, Take 100 mg by mouth 2 (two) times daily.   escitalopram (LEXAPRO) 10 MG tablet, Take 1 tablet by mouth daily.   ketoconazole (NIZORAL) 2 % cream, Apply topically daily as needed.   Vitamin D, Ergocalciferol, (DRISDOL) 1.25 MG (50000 UNIT) CAPS capsule, Take by mouth.   Do not take Diabetes Med Glucophage (Metformin) on the day of the procedure and HOLD 48 HOURS AFTER THE PROCEDURE.,   Hold farxiga the morning of procedure.  If you take Tresiba at night, take 1/2 dose. If you take it in the morning, do not take it the morning of the procedure.   On the morning of your procedure, take your Aspirin 81 mg and Plavix/Clopidogrel and any morning  medicines NOT listed above.  You may use sips of water.  5. Plan for one night stay--bring personal belongings. 6. Bring a current list of your medications and current insurance cards. 7. You MUST have a responsible person to drive you home. 8. Someone MUST be with you the first 24 hours after you arrive home or your discharge will be delayed. 9. Please wear clothes that are easy to get on and off and wear slip-on shoes.  Thank you for allowing [place] to care for you!   -- [place] [facility]

## 2022-01-31 NOTE — Telephone Encounter (Signed)
Reviewed with patient the instructions for cardiac cath and blood work. Patient had a cardiac visit on Nov.1. 2023, which he will fax the OV and EKG to [phone]. Patient had not questions at this time. Will mail instructions and lab orders to patient.

## 2022-02-19 ENCOUNTER — Ambulatory Visit (INDEPENDENT_AMBULATORY_CARE_PROVIDER_SITE_OTHER): Payer: Managed Care, Other (non HMO) | Admitting: Cardiology

## 2022-02-19 ENCOUNTER — Ambulatory Visit (HOSPITAL_COMMUNITY)
Admission: RE | Admit: 2022-02-19 | Discharge: 2022-02-19 | Disposition: A | Discharge status: Home / Self Care | Payer: Managed Care, Other (non HMO) | Source: Ambulatory Visit | Attending: Cardiology | Admitting: Cardiology

## 2022-02-19 ENCOUNTER — Encounter: Payer: Self-pay | Admitting: Cardiology

## 2022-02-19 VITALS — BP 120/60 | HR 67 | Ht 66.0 in | Wt 169.6 lb

## 2022-02-19 DIAGNOSIS — E118 Type 2 diabetes mellitus with unspecified complications: Secondary | ICD-10-CM

## 2022-02-19 DIAGNOSIS — I6523 Occlusion and stenosis of bilateral carotid arteries: Secondary | ICD-10-CM

## 2022-02-19 DIAGNOSIS — I200 Unstable angina: Secondary | ICD-10-CM | POA: Diagnosis not present

## 2022-02-19 NOTE — Progress Notes (Signed)
Cardiology Office Note   Date:  02/19/2022   ID:  Danny [name], DOB 04-03-1965, MRN [id number]  PCP:  No primary care provider on file.  Cardiologist:   [name], MD   Chief Complaint  Patient presents with   Chest Pain       History of Present Illness: Danny [name] is a 56 y.o. male who presents for follow up of CAD.  He had a history of PCI to distal RCA in 2006.  She had a severe in-stent restenosis by cardiac catheterization in February 2014 this was treated with a overlapping drug-eluting stent.  Patient had a cardiac catheterization on 01/05/2015 that showed 95% mid left circumflex lesion treated with a drug-eluting stent.  Echo obtained on 5/27 showed EF 65 to 70%, grade 1 DD, mild TR.  Cardiac catheterization obtained on 08/10/2017 showed 95% distal RCA lesion treated with 2.75 x 12 mm drug-eluting stent, EF 55 to 65%.   He was last in the hospital in Jan 2020 with unstable angina again.  He underwent cardiac catheterization with results as below.  He was found to have distal RCA stent restenosis.  He had PCI of the in-stent restenosis.  He came back for a second opinion after having chest pain and cardiac cath at  [facility].  [place] in that area.  He was found to have occluded circumflex and they were unable to cross this.  The LAD had 50% mid stenosis.  RCA was dominant with multiple overlapping mid stents with diffuse in-stent restenosis.  The left circumflex apparently was crossed with a wire but they were unable to cross the chronic total occlusion in the distal edge of the stent at the OM 2.  There was a mid PTCA using a 2.0 x 10 mm semicompliant balloon.  The description is slightly confusing to be used.  He was managed with Ranexa.  He said he has continued to get this discomfort walking up an incline but only now after he eats a meal.  However, he wants to be more active.  He wants to do more than he is capable of doing right now without getting the  chest discomfort.  He said he did have improvement going from [phone] twice a day of Ranexa.  He has not taken frequent sublingual nitroglycerin.  He is not having any resting symptoms.  He has had no PND apnea.  He had no new palpitations, presyncope or syncope.  At the visit with me he was having increasing chest discomfort with mild activity.  He is an active guy and likes to go hiking and he says he is getting this discomfort with mild walking up an incline particularly after he eats.  He continues to have symptoms despite starting November.  I did review his cath films with one of my interventional partners.  When he is difficult to assess particularly the LAD and circumflex territory.  He has high-grade stenosis proximal to a large posterolateral and PDA.  Likely he would be better revascularized with bypass but we need better images.  He is here today to for preprocedure visit.  Past Medical History:  Diagnosis Date   CAD (coronary artery disease)    COSTAR study DES Stent to RCA 2006;   LHC  05/19/12: oLM 20%, LAD 40-50%, pD1 50%, mCFX 30-40%, mRCA stent with severe ISR and 70% beyond the stent, dRCA 50%, pPDA 50%, pPLA 30-40%.  EF 55-65%. =>  PCI:  Overlapping Promus  DES x2 to the mid RCA ISR. [place] s/p DES to LCx  c.95% circ DES 10/16,  [place]    CAD in native artery, hx of multiple PCIs 08/19/2017   DM2 (diabetes mellitus, type 2) (HCC)    GERD (gastroesophageal reflux disease)    HLD (hyperlipidemia)    Obesity     Past Surgical History:  Procedure Laterality Date   CARDIAC CATHETERIZATION N/A 01/05/2015   Procedure: Left Heart Cath and Coronary Angiography;  Surgeon: Peter M Swaziland, MD;  Location: [facility] INVASIVE CV LAB;  Service: Cardiovascular;  Laterality: N/A;   CARDIAC CATHETERIZATION  01/05/2015   Procedure: Coronary Stent Intervention;  Surgeon: Peter M Swaziland, MD;  Location: [facility] INVASIVE CV LAB;  Service: Cardiovascular;;   CORONARY ANGIOPLASTY WITH STENT PLACEMENT  05/19/2012   DES    to RCA   by Dr Excell Seltzer   CORONARY BALLOON ANGIOPLASTY N/A 04/02/2018   Procedure: CORONARY BALLOON ANGIOPLASTY;  Surgeon: Swaziland, Peter M, MD;  Location: [facility] INVASIVE CV LAB;  Service: Cardiovascular;  Laterality: N/A;   CORONARY STENT INTERVENTION N/A 08/18/2017   Procedure: CORONARY STENT INTERVENTION;  Surgeon: Marykay Lex, MD;  Location: [facility] INVASIVE CV LAB;  Service: Cardiovascular;  Laterality: N/A;   CORONARY STENT PLACEMENT  01/05/2015   mid cx  des   INTRAVASCULAR ULTRASOUND/IVUS N/A 04/02/2018   Procedure: Intravascular Ultrasound/IVUS;  Surgeon: Swaziland, Peter M, MD;  Location: [facility] INVASIVE CV LAB;  Service: Cardiovascular;  Laterality: N/A;   LEFT HEART CATH AND CORONARY ANGIOGRAPHY N/A 08/18/2017   Procedure: LEFT HEART CATH AND CORONARY ANGIOGRAPHY;  Surgeon: Marykay Lex, MD;  Location: [facility] INVASIVE CV LAB;  Service: Cardiovascular;  Laterality: N/A;   LEFT HEART CATH AND CORONARY ANGIOGRAPHY N/A 04/02/2018   Procedure: LEFT HEART CATH AND CORONARY ANGIOGRAPHY;  Surgeon: Swaziland, Peter M, MD;  Location: [facility] INVASIVE CV LAB;  Service: Cardiovascular;  Laterality: N/A;   PERCUTANEOUS CORONARY STENT INTERVENTION (PCI-S) N/A 05/19/2012   Procedure: PERCUTANEOUS CORONARY STENT INTERVENTION (PCI-S);  Surgeon: Tonny Bollman, MD;  Location: [facility] CATH LAB;  Service: Cardiovascular;  Laterality: N/A;     Current Outpatient Medications  Medication Sig Dispense Refill   ARTIFICIAL TEAR SOLUTION OP Place 1 drop into both eyes daily as needed (dry eyes).     aspirin EC 81 MG EC tablet Take 1 tablet (81 mg total) by mouth daily.     atorvastatin (LIPITOR) 80 MG tablet TAKE 1 TABLET BY MOUTH DAILY 90 tablet 3   clopidogrel (PLAVIX) 75 MG tablet TAKE 1 TABLET EVERY DAY 90 tablet 1   Continuous Blood Gluc Sensor (FREESTYLE LIBRE 14 DAY SENSOR) MISC SMARTSIG:1 Topical Every 2 Weeks     Continuous Blood Gluc Sensor (FREESTYLE LIBRE 14 DAY SENSOR) MISC See admin instructions.     escitalopram (LEXAPRO)  10 MG tablet Take 1 tablet by mouth daily.     FARXIGA 10 MG TABS tablet Take 10 mg by mouth daily.     fenofibrate (TRICOR) 145 MG tablet Take 145 mg by mouth daily.     icosapent Ethyl (VASCEPA) 1 g capsule Take 2 capsules (2 g total) by mouth 2 (two) times daily. 360 capsule 3   isosorbide mononitrate (IMDUR) 60 MG 24 hr tablet Take 1 tablet (60 mg total) by mouth daily. 90 tablet 3   ketoconazole (NIZORAL) 2 % cream Apply topically daily as needed.     metFORMIN (GLUCOPHAGE) 1000 MG tablet Take 1 tablet by mouth 2 (two) times daily.  nitroGLYCERIN (NITROSTAT) 0.4 MG SL tablet PLACE 1 TABLET (0.4 MG TOTAL) UNDER THE TONGUE EVERY 5 (FIVE) MINUTES AS NEEDED FOR CHEST PAIN. 25 tablet 2   ranolazine (RANEXA) 500 MG 12 hr tablet Take 1,000 mg by mouth 2 (two) times daily.     TRESIBA FLEXTOUCH 200 UNIT/ML SOPN Inject 40 Units as directed daily.   5   Vitamin D, Ergocalciferol, (DRISDOL) 1.25 MG (50000 UNIT) CAPS capsule Take by mouth.     No current facility-administered medications for this visit.    Allergies:   Rosuvastatin, Ampicillin, Metformin, and Penicillins    ROS:  Please see the history of present illness.   Otherwise, review of systems are positive for none.   All other systems are reviewed and negative.    PHYSICAL EXAM: VS:  BP 120/60   Pulse 67   Ht 5\' 6"  (1.676 m)   Wt 169 lb 9.6 oz (76.9 kg)   SpO2 98%   BMI 27.37 kg/m  , BMI Body mass index is 27.37 kg/m. GENERAL:  Well appearing NECK:  No jugular venous distention, waveform within normal limits, carotid upstroke brisk and symmetric, no bruits, no thyromegaly LUNGS:  Clear to auscultation bilaterally CHEST:  Unremarkable HEART:  PMI not displaced or sustained,S1 and S2 within normal limits, no S3, no S4, no clicks, no rubs, no murmurs ABD:  Flat, positive bowel sounds normal in frequency in pitch, no bruits, no rebound, no guarding, no midline pulsatile mass, no hepatomegaly, no splenomegaly EXT:  2 plus pulses  throughout, no edema, no cyanosis no clubbing   EKG:  EKG is  ordered today. The ekg ordered  demonstrates Probable ectopic atrial  rhythm, rate 67, leftward axis, no acute ST-T wave changes.   Recent Labs: No results found for requested labs within last 365 days.    Lipid Panel    Component Value Date/Time   CHOL 87 (L) 09/28/2018 0815   TRIG 157 (H) 09/28/2018 0815   HDL 28 (L) 09/28/2018 0815   CHOLHDL 3.1 09/28/2018 0815   CHOLHDL 7.4 08/16/2017 0320   VLDL UNABLE TO CALCULATE IF TRIGLYCERIDE OVER 400 mg/dL 08/18/2017 32/95/1884   LDLCALC 28 09/28/2018 0815   LDLDIRECT 38 09/28/2018 0815   LDLDIRECT 57.7 08/25/2012 0951      Wt Readings from Last 3 Encounters:  02/19/22 169 lb 9.6 oz (76.9 kg)  01/16/22 166 lb 9.6 oz (75.6 kg)  09/12/20 160 lb (72.6 kg)      Other studies Reviewed: Additional studies/ records that were reviewed today include: Records from the outside hospital Review of the above records demonstrates:  Please see elsewhere in the note.     ASSESSMENT AND PLAN:  CAD:   The patient has extensive coronary disease as described.  His symptoms amount to unstable angina.  I think consideration of more definitive revascularization is indicated.  He needs a repeat cardiac catheterization prior to considering CABG as the previous images were somewhat difficult to assess to look for the targets particularly in the LAD and circumflex distribution. The patient understands that risks included but are not limited to stroke (1 in 1000), death (1 in 1000), kidney failure [usually temporary] (1 in 500), bleeding (1 in 200), allergic reaction [possibly serious] (1 in 200).  The patient understands and agrees to proceed.   DM:   His A1c was 6.9 late last year which was down from 8.  The request of his primary provider I will be ordering another A1c  DYSLIPIDEMIA:  Labs are pending today.  LP(a) has been ordered.  Goal of his LDL is 50.  CAROTID:   He had 40 to 59% right  carotid stenosis in January 2021.  He is to have follow-up Dopplers today.   Current medicines are reviewed at length with the patient today.  The patient does not have concerns regarding medicines.  The following changes have been made: Aas above  Labs/ tests ordered today include:    Orders Placed This Encounter  Procedures   HgB A1c   CBC with Differential   Comprehensive Metabolic Panel (CMET)   EKG 12-Lead      Disposition:   FU with me in one month    Signed, Elise Gladden, MD  02/19/2022 12:32 PM    [facility]  

## 2022-02-19 NOTE — H&P (View-Only) (Signed)
Cardiology Office Note   Date:  02/19/2022   ID:  Danny [name], DOB 04-03-1965, MRN [id number]  PCP:  No primary care provider on file.  Cardiologist:   [name], MD   Chief Complaint  Patient presents with   Chest Pain       History of Present Illness: Danny [name] is a 56 y.o. male who presents for follow up of CAD.  He had a history of PCI to distal RCA in 2006.  She had a severe in-stent restenosis by cardiac catheterization in February 2014 this was treated with a overlapping drug-eluting stent.  Patient had a cardiac catheterization on 01/05/2015 that showed 95% mid left circumflex lesion treated with a drug-eluting stent.  Echo obtained on 5/27 showed EF 65 to 70%, grade 1 DD, mild TR.  Cardiac catheterization obtained on 08/10/2017 showed 95% distal RCA lesion treated with 2.75 x 12 mm drug-eluting stent, EF 55 to 65%.   He was last in the hospital in Jan 2020 with unstable angina again.  He underwent cardiac catheterization with results as below.  He was found to have distal RCA stent restenosis.  He had PCI of the in-stent restenosis.  He came back for a second opinion after having chest pain and cardiac cath at  [facility].  [place] in [place] area.  He was found to have occluded circumflex and they were unable to cross this.  The LAD had 50% mid stenosis.  RCA was dominant with multiple overlapping mid stents with diffuse in-stent restenosis.  The left circumflex apparently was crossed with a wire but they were unable to cross the chronic total occlusion in the distal edge of the stent at the OM 2.  There was a mid PTCA using a 2.0 x 10 mm semicompliant balloon.  The description is slightly confusing to be used.  He was managed with Ranexa.  He said he has continued to get this discomfort walking up an incline but only now after he eats a meal.  However, he wants to be more active.  He wants to do more than he is capable of doing right now without getting the  chest discomfort.  He said he did have improvement going from [phone] twice a day of Ranexa.  He has not taken frequent sublingual nitroglycerin.  He is not having any resting symptoms.  He has had no PND apnea.  He had no new palpitations, presyncope or syncope.  At the visit with me he was having increasing chest discomfort with mild activity.  He is an active guy and likes to go hiking and he says he is getting this discomfort with mild walking up an incline particularly after he eats.  He continues to have symptoms despite starting November.  I did review his cath films with one of my interventional partners.  When he is difficult to assess particularly the LAD and circumflex territory.  He has high-grade stenosis proximal to a large posterolateral and PDA.  Likely he would be better revascularized with bypass but we need better images.  He is here today to for preprocedure visit.  Past Medical History:  Diagnosis Date   CAD (coronary artery disease)    COSTAR study DES Stent to RCA 2006;   LHC  05/19/12: oLM 20%, LAD 40-50%, pD1 50%, mCFX 30-40%, mRCA stent with severe ISR and 70% beyond the stent, dRCA 50%, pPDA 50%, pPLA 30-40%.  EF 55-65%. =>  PCI:  Overlapping Promus  DES x2 to the mid RCA ISR. [place] s/p DES to LCx  c.95% circ DES 10/16,  [place]    CAD in native artery, hx of multiple PCIs 08/19/2017   DM2 (diabetes mellitus, type 2) (HCC)    GERD (gastroesophageal reflux disease)    HLD (hyperlipidemia)    Obesity     Past Surgical History:  Procedure Laterality Date   CARDIAC CATHETERIZATION N/A 01/05/2015   Procedure: Left Heart Cath and Coronary Angiography;  Surgeon: Peter M Swaziland, MD;  Location: [facility] INVASIVE CV LAB;  Service: Cardiovascular;  Laterality: N/A;   CARDIAC CATHETERIZATION  01/05/2015   Procedure: Coronary Stent Intervention;  Surgeon: Peter M Swaziland, MD;  Location: [facility] INVASIVE CV LAB;  Service: Cardiovascular;;   CORONARY ANGIOPLASTY WITH STENT PLACEMENT  05/19/2012   DES    to RCA   by Dr Excell Seltzer   CORONARY BALLOON ANGIOPLASTY N/A 04/02/2018   Procedure: CORONARY BALLOON ANGIOPLASTY;  Surgeon: Swaziland, Peter M, MD;  Location: [facility] INVASIVE CV LAB;  Service: Cardiovascular;  Laterality: N/A;   CORONARY STENT INTERVENTION N/A 08/18/2017   Procedure: CORONARY STENT INTERVENTION;  Surgeon: Marykay Lex, MD;  Location: [facility] INVASIVE CV LAB;  Service: Cardiovascular;  Laterality: N/A;   CORONARY STENT PLACEMENT  01/05/2015   mid cx  des   INTRAVASCULAR ULTRASOUND/IVUS N/A 04/02/2018   Procedure: Intravascular Ultrasound/IVUS;  Surgeon: Swaziland, Peter M, MD;  Location: [facility] INVASIVE CV LAB;  Service: Cardiovascular;  Laterality: N/A;   LEFT HEART CATH AND CORONARY ANGIOGRAPHY N/A 08/18/2017   Procedure: LEFT HEART CATH AND CORONARY ANGIOGRAPHY;  Surgeon: Marykay Lex, MD;  Location: [facility] INVASIVE CV LAB;  Service: Cardiovascular;  Laterality: N/A;   LEFT HEART CATH AND CORONARY ANGIOGRAPHY N/A 04/02/2018   Procedure: LEFT HEART CATH AND CORONARY ANGIOGRAPHY;  Surgeon: Swaziland, Peter M, MD;  Location: [facility] INVASIVE CV LAB;  Service: Cardiovascular;  Laterality: N/A;   PERCUTANEOUS CORONARY STENT INTERVENTION (PCI-S) N/A 05/19/2012   Procedure: PERCUTANEOUS CORONARY STENT INTERVENTION (PCI-S);  Surgeon: Tonny Bollman, MD;  Location: [facility] CATH LAB;  Service: Cardiovascular;  Laterality: N/A;     Current Outpatient Medications  Medication Sig Dispense Refill   ARTIFICIAL TEAR SOLUTION OP Place 1 drop into both eyes daily as needed (dry eyes).     aspirin EC 81 MG EC tablet Take 1 tablet (81 mg total) by mouth daily.     atorvastatin (LIPITOR) 80 MG tablet TAKE 1 TABLET BY MOUTH DAILY 90 tablet 3   clopidogrel (PLAVIX) 75 MG tablet TAKE 1 TABLET EVERY DAY 90 tablet 1   Continuous Blood Gluc Sensor (FREESTYLE LIBRE 14 DAY SENSOR) MISC SMARTSIG:1 Topical Every 2 Weeks     Continuous Blood Gluc Sensor (FREESTYLE LIBRE 14 DAY SENSOR) MISC See admin instructions.     escitalopram (LEXAPRO)  10 MG tablet Take 1 tablet by mouth daily.     FARXIGA 10 MG TABS tablet Take 10 mg by mouth daily.     fenofibrate (TRICOR) 145 MG tablet Take 145 mg by mouth daily.     icosapent Ethyl (VASCEPA) 1 g capsule Take 2 capsules (2 g total) by mouth 2 (two) times daily. 360 capsule 3   isosorbide mononitrate (IMDUR) 60 MG 24 hr tablet Take 1 tablet (60 mg total) by mouth daily. 90 tablet 3   ketoconazole (NIZORAL) 2 % cream Apply topically daily as needed.     metFORMIN (GLUCOPHAGE) 1000 MG tablet Take 1 tablet by mouth 2 (two) times daily.  nitroGLYCERIN (NITROSTAT) 0.4 MG SL tablet PLACE 1 TABLET (0.4 MG TOTAL) UNDER THE TONGUE EVERY 5 (FIVE) MINUTES AS NEEDED FOR CHEST PAIN. 25 tablet 2   ranolazine (RANEXA) 500 MG 12 hr tablet Take 1,000 mg by mouth 2 (two) times daily.     TRESIBA FLEXTOUCH 200 UNIT/ML SOPN Inject 40 Units as directed daily.   5   Vitamin D, Ergocalciferol, (DRISDOL) 1.25 MG (50000 UNIT) CAPS capsule Take by mouth.     No current facility-administered medications for this visit.    Allergies:   Rosuvastatin, Ampicillin, Metformin, and Penicillins    ROS:  Please see the history of present illness.   Otherwise, review of systems are positive for none.   All other systems are reviewed and negative.    PHYSICAL EXAM: VS:  BP 120/60   Pulse 67   Ht 5\' 6"  (1.676 m)   Wt 169 lb 9.6 oz (76.9 kg)   SpO2 98%   BMI 27.37 kg/m  , BMI Body mass index is 27.37 kg/m. GENERAL:  Well appearing NECK:  No jugular venous distention, waveform within normal limits, carotid upstroke brisk and symmetric, no bruits, no thyromegaly LUNGS:  Clear to auscultation bilaterally CHEST:  Unremarkable HEART:  PMI not displaced or sustained,S1 and S2 within normal limits, no S3, no S4, no clicks, no rubs, no murmurs ABD:  Flat, positive bowel sounds normal in frequency in pitch, no bruits, no rebound, no guarding, no midline pulsatile mass, no hepatomegaly, no splenomegaly EXT:  2 plus pulses  throughout, no edema, no cyanosis no clubbing   EKG:  EKG is  ordered today. The ekg ordered  demonstrates Probable ectopic atrial  rhythm, rate 67, leftward axis, no acute ST-T wave changes.   Recent Labs: No results found for requested labs within last 365 days.    Lipid Panel    Component Value Date/Time   CHOL 87 (L) 09/28/2018 0815   TRIG 157 (H) 09/28/2018 0815   HDL 28 (L) 09/28/2018 0815   CHOLHDL 3.1 09/28/2018 0815   CHOLHDL 7.4 08/16/2017 0320   VLDL UNABLE TO CALCULATE IF TRIGLYCERIDE OVER 400 mg/dL 08/18/2017 32/95/1884   LDLCALC 28 09/28/2018 0815   LDLDIRECT 38 09/28/2018 0815   LDLDIRECT 57.7 08/25/2012 0951      Wt Readings from Last 3 Encounters:  02/19/22 169 lb 9.6 oz (76.9 kg)  01/16/22 166 lb 9.6 oz (75.6 kg)  09/12/20 160 lb (72.6 kg)      Other studies Reviewed: Additional studies/ records that were reviewed today include: Records from the outside hospital Review of the above records demonstrates:  Please see elsewhere in the note.     ASSESSMENT AND PLAN:  CAD:   The patient has extensive coronary disease as described.  His symptoms amount to unstable angina.  I think consideration of more definitive revascularization is indicated.  He needs a repeat cardiac catheterization prior to considering CABG as the previous images were somewhat difficult to assess to look for the targets particularly in the LAD and circumflex distribution. The patient understands that risks included but are not limited to stroke (1 in 1000), death (1 in 1000), kidney failure [usually temporary] (1 in 500), bleeding (1 in 200), allergic reaction [possibly serious] (1 in 200).  The patient understands and agrees to proceed.   DM:   His A1c was 6.9 late last year which was down from 8.  The request of his primary provider I will be ordering another A1c  DYSLIPIDEMIA:  Labs are pending today.  LP(a) has been ordered.  Goal of his LDL is 50.  CAROTID:   He had 40 to 59% right  carotid stenosis in January 2021.  He is to have follow-up Dopplers today.   Current medicines are reviewed at length with the patient today.  The patient does not have concerns regarding medicines.  The following changes have been made: Aas above  Labs/ tests ordered today include:    Orders Placed This Encounter  Procedures   HgB A1c   CBC with Differential   Comprehensive Metabolic Panel (CMET)   EKG 12-Lead      Disposition:   FU with me in one month    Signed, Rollene RotundaJames Madalena Kesecker, MD  02/19/2022 12:32 PM    [facility]

## 2022-02-19 NOTE — Patient Instructions (Signed)
    Follow-Up: At [facility], you and your health needs are our priority.  As part of our continuing mission to provide you with exceptional heart care, we have created designated Provider Care Teams.  These Care Teams include your primary Cardiologist (physician) and Advanced Practice Providers (APPs -  Physician Assistants and Nurse Practitioners) who all work together to provide you with the care you need, when you need it.  We recommend signing up for the patient portal called "MyChart".  Sign up information is provided on this After Visit Summary.  MyChart is used to connect with patients for Virtual Visits (Telemedicine).  Patients are able to view lab/test results, encounter notes, upcoming appointments, etc.  Non-urgent messages can be sent to your provider as well.   To learn more about what you can do with MyChart, go to [email or web address].    Your next appointment:    AS NEEDED 

## 2022-02-20 LAB — COMPREHENSIVE METABOLIC PANEL
ALT: 14 IU/L (ref 0–44)
AST: 18 IU/L (ref 0–40)
Albumin/Globulin Ratio: 2 (ref 1.2–2.2)
Albumin: 4.5 g/dL (ref 3.8–4.9)
Alkaline Phosphatase: 117 IU/L (ref 44–121)
BUN/Creatinine Ratio: 13 (ref 9–20)
BUN: 12 mg/dL (ref 6–24)
Bilirubin Total: 0.5 mg/dL (ref 0.0–1.2)
CO2: 24 mmol/L (ref 20–29)
Calcium: 9 mg/dL (ref 8.7–10.2)
Chloride: 101 mmol/L (ref 96–106)
Creatinine, Ser: 0.93 mg/dL (ref 0.76–1.27)
Globulin, Total: 2.3 g/dL (ref 1.5–4.5)
Glucose: 252 mg/dL — ABNORMAL HIGH (ref 70–99)
Potassium: 4.7 mmol/L (ref 3.5–5.2)
Sodium: 141 mmol/L (ref 134–144)
Total Protein: 6.8 g/dL (ref 6.0–8.5)
eGFR: 97 mL/min/{1.73_m2} (ref >59)

## 2022-02-20 LAB — CBC WITH DIFFERENTIAL/PLATELET
Basophils Absolute: 0 10*3/uL (ref 0.0–0.2)
Basos: 1 %
EOS (ABSOLUTE): 0.3 10*3/uL (ref 0.0–0.4)
Eos: 4 %
Hematocrit: 43.5 % (ref 37.5–51.0)
Hemoglobin: 14.7 g/dL (ref 13.0–17.7)
Immature Grans (Abs): 0 10*3/uL (ref 0.0–0.1)
Immature Granulocytes: 0 %
Lymphocytes Absolute: 2.3 10*3/uL (ref 0.7–3.1)
Lymphs: 28 %
MCH: 31 pg (ref 26.6–33.0)
MCHC: 33.8 g/dL (ref 31.5–35.7)
MCV: 92 fL (ref 79–97)
Monocytes Absolute: 0.5 10*3/uL (ref 0.1–0.9)
Monocytes: 6 %
Neutrophils Absolute: 5 10*3/uL (ref 1.4–7.0)
Neutrophils: 61 %
Platelets: 226 10*3/uL (ref 150–450)
RBC: 4.74 x10E6/uL (ref 4.14–5.80)
RDW: 12.2 % (ref 11.6–15.4)
WBC: 8.2 10*3/uL (ref 3.4–10.8)

## 2022-02-20 LAB — HEMOGLOBIN A1C
Est. average glucose Bld gHb Est-mCnc: 189 mg/dL
Hgb A1c MFr Bld: 8.2 % — ABNORMAL HIGH (ref 4.8–5.6)

## 2022-02-25 ENCOUNTER — Telehealth: Payer: Self-pay | Admitting: *Deleted

## 2022-02-25 NOTE — Telephone Encounter (Signed)
Cardiac Catheterization scheduled at [facility] for: Thursday February 27, 2022 12 Noon Arrival time and place: [facility] Main Entrance A at: 10 AM  Nothing to eat after midnight prior to procedure, clear liquids until 5 AM day of procedure.  Medication instructions: -Hold:  Tresiba -AM of procedure  Metformin-day of procedure and 48 hours post procedure  Farxiga-AM of procedure -Except hold medications usual morning medications can be taken with sips of water including aspirin 81 mg and Plavix 75 mg  Confirmed patient has responsible adult to drive home post procedure and be with patient first 24 hours after arriving home.  Patient reports no new symptoms concerning for COVID-19 in the past 10 days.  Reviewed procedure instructions with patient.

## 2022-02-27 ENCOUNTER — Encounter (HOSPITAL_COMMUNITY)
Admission: RE | Disposition: A | Discharge status: Home / Self Care | Payer: Self-pay | Source: Home / Self Care | Attending: Cardiovascular Disease

## 2022-02-27 ENCOUNTER — Ambulatory Visit (HOSPITAL_COMMUNITY)
Admission: RE | Admit: 2022-02-27 | Discharge: 2022-02-27 | Disposition: A | Discharge status: Home / Self Care | Payer: Managed Care, Other (non HMO) | Attending: Cardiovascular Disease | Admitting: Cardiovascular Disease

## 2022-02-27 ENCOUNTER — Ambulatory Visit (HOSPITAL_COMMUNITY): Payer: Managed Care, Other (non HMO)

## 2022-02-27 ENCOUNTER — Other Ambulatory Visit: Payer: Self-pay

## 2022-02-27 DIAGNOSIS — I6521 Occlusion and stenosis of right carotid artery: Secondary | ICD-10-CM | POA: Insufficient documentation

## 2022-02-27 DIAGNOSIS — F32A Depression, unspecified: Secondary | ICD-10-CM | POA: Insufficient documentation

## 2022-02-27 DIAGNOSIS — E119 Type 2 diabetes mellitus without complications: Secondary | ICD-10-CM | POA: Diagnosis not present

## 2022-02-27 DIAGNOSIS — E785 Hyperlipidemia, unspecified: Secondary | ICD-10-CM | POA: Diagnosis not present

## 2022-02-27 DIAGNOSIS — K219 Gastro-esophageal reflux disease without esophagitis: Secondary | ICD-10-CM | POA: Diagnosis not present

## 2022-02-27 DIAGNOSIS — I2582 Chronic total occlusion of coronary artery: Secondary | ICD-10-CM | POA: Insufficient documentation

## 2022-02-27 DIAGNOSIS — I25110 Atherosclerotic heart disease of native coronary artery with unstable angina pectoris: Secondary | ICD-10-CM | POA: Diagnosis present

## 2022-02-27 DIAGNOSIS — Z955 Presence of coronary angioplasty implant and graft: Secondary | ICD-10-CM | POA: Diagnosis not present

## 2022-02-27 DIAGNOSIS — Q248 Other specified congenital malformations of heart: Secondary | ICD-10-CM | POA: Insufficient documentation

## 2022-02-27 DIAGNOSIS — E559 Vitamin D deficiency, unspecified: Secondary | ICD-10-CM | POA: Insufficient documentation

## 2022-02-27 DIAGNOSIS — Z6827 Body mass index (BMI) 27.0-27.9, adult: Secondary | ICD-10-CM | POA: Insufficient documentation

## 2022-02-27 DIAGNOSIS — E669 Obesity, unspecified: Secondary | ICD-10-CM | POA: Diagnosis not present

## 2022-02-27 DIAGNOSIS — Z7984 Long term (current) use of oral hypoglycemic drugs: Secondary | ICD-10-CM | POA: Diagnosis not present

## 2022-02-27 DIAGNOSIS — H9319 Tinnitus, unspecified ear: Secondary | ICD-10-CM | POA: Insufficient documentation

## 2022-02-27 DIAGNOSIS — I25112 Atherosclerotic heart disease of native coronary artery with refractory angina pectoris: Secondary | ICD-10-CM

## 2022-02-27 DIAGNOSIS — Z794 Long term (current) use of insulin: Secondary | ICD-10-CM | POA: Diagnosis not present

## 2022-02-27 DIAGNOSIS — H539 Unspecified visual disturbance: Secondary | ICD-10-CM | POA: Insufficient documentation

## 2022-02-27 HISTORY — PX: LEFT HEART CATH AND CORONARY ANGIOGRAPHY: CATH118249

## 2022-02-27 LAB — ECHOCARDIOGRAM COMPLETE
Area-P 1/2: 2.95 cm2
Calc EF: 63.8 %
Height: 66 in
S' Lateral: 2.5 cm
Single Plane A2C EF: 68.1 %
Single Plane A4C EF: 60 %
Weight: 2560 oz

## 2022-02-27 LAB — GLUCOSE, CAPILLARY: Glucose-Capillary: 142 mg/dL — ABNORMAL HIGH (ref 70–99)

## 2022-02-27 SURGERY — LEFT HEART CATH AND CORONARY ANGIOGRAPHY
Anesthesia: LOCAL

## 2022-02-27 MED ORDER — HEPARIN SODIUM (PORCINE) 1000 UNIT/ML IJ SOLN
INTRAMUSCULAR | Status: AC
  Filled 2022-02-27: qty 10

## 2022-02-27 MED ORDER — SODIUM CHLORIDE 0.9% FLUSH: 3.0000 mL | Freq: Two times a day (BID) | INTRAVENOUS | Status: DC

## 2022-02-27 MED ORDER — LIDOCAINE HCL (PF) 1 % IJ SOLN
INTRAMUSCULAR | Status: DC | PRN
  Administered 2022-02-27: 2 mL

## 2022-02-27 MED ORDER — LIDOCAINE HCL (PF) 1 % IJ SOLN
INTRAMUSCULAR | Status: AC
  Filled 2022-02-27: qty 30

## 2022-02-27 MED ORDER — ONDANSETRON HCL 4 MG/2ML IJ SOLN: 4.0000 mg | Freq: Four times a day (QID) | INTRAMUSCULAR | Status: DC | PRN

## 2022-02-27 MED ORDER — IOHEXOL 350 MG/ML SOLN
INTRAVENOUS | Status: DC | PRN
  Administered 2022-02-27: 45 mL

## 2022-02-27 MED ORDER — HEPARIN SODIUM (PORCINE) 1000 UNIT/ML IJ SOLN
INTRAMUSCULAR | Status: DC | PRN
  Administered 2022-02-27: 4000 [IU] via INTRAVENOUS

## 2022-02-27 MED ORDER — VERAPAMIL HCL 2.5 MG/ML IV SOLN
INTRAVENOUS | Status: AC
  Filled 2022-02-27: qty 2

## 2022-02-27 MED ORDER — MIDAZOLAM HCL 2 MG/2ML IJ SOLN
INTRAMUSCULAR | Status: DC | PRN
  Administered 2022-02-27: 2 mg via INTRAVENOUS

## 2022-02-27 MED ORDER — MIDAZOLAM HCL 2 MG/2ML IJ SOLN
INTRAMUSCULAR | Status: AC
  Filled 2022-02-27: qty 2

## 2022-02-27 MED ORDER — SODIUM CHLORIDE 0.9 % WEIGHT BASED INFUSION
3.0000 mL/kg/h | INTRAVENOUS | Status: AC
  Administered 2022-02-27: 3 mL/kg/h via INTRAVENOUS

## 2022-02-27 MED ORDER — HYDRALAZINE HCL 20 MG/ML IJ SOLN: 10.0000 mg | INTRAMUSCULAR | Status: DC | PRN

## 2022-02-27 MED ORDER — HEPARIN (PORCINE) IN NACL 1000-0.9 UT/500ML-% IV SOLN
INTRAVENOUS | Status: AC
  Filled 2022-02-27: qty 1000

## 2022-02-27 MED ORDER — CLOPIDOGREL BISULFATE 75 MG PO TABS: 75.0000 mg | ORAL_TABLET | ORAL | Status: DC

## 2022-02-27 MED ORDER — VERAPAMIL HCL 2.5 MG/ML IV SOLN
INTRAVENOUS | Status: DC | PRN
  Administered 2022-02-27: 10 mL via INTRA_ARTERIAL

## 2022-02-27 MED ORDER — FENTANYL CITRATE (PF) 100 MCG/2ML IJ SOLN
INTRAMUSCULAR | Status: DC | PRN
  Administered 2022-02-27: 50 ug via INTRAVENOUS

## 2022-02-27 MED ORDER — SODIUM CHLORIDE 0.9 % IV SOLN: INTRAVENOUS | Status: AC

## 2022-02-27 MED ORDER — ASPIRIN 81 MG PO CHEW: 81.0000 mg | CHEWABLE_TABLET | ORAL | Status: DC

## 2022-02-27 MED ORDER — HEPARIN (PORCINE) IN NACL 1000-0.9 UT/500ML-% IV SOLN
INTRAVENOUS | Status: DC | PRN
  Administered 2022-02-27 (×2): 500 mL

## 2022-02-27 MED ORDER — FENTANYL CITRATE (PF) 100 MCG/2ML IJ SOLN
INTRAMUSCULAR | Status: AC
  Filled 2022-02-27: qty 2

## 2022-02-27 MED ORDER — SODIUM CHLORIDE 0.9 % IV SOLN: 250.0000 mL | INTRAVENOUS | Status: DC | PRN

## 2022-02-27 MED ORDER — SODIUM CHLORIDE 0.9 % WEIGHT BASED INFUSION: 1.0000 mL/kg/h | INTRAVENOUS | Status: DC

## 2022-02-27 MED ORDER — SODIUM CHLORIDE 0.9% FLUSH: 3.0000 mL | INTRAVENOUS | Status: DC | PRN

## 2022-02-27 MED ORDER — ACETAMINOPHEN 325 MG PO TABS: 650.0000 mg | ORAL_TABLET | ORAL | Status: DC | PRN

## 2022-02-27 MED ORDER — LABETALOL HCL 5 MG/ML IV SOLN: 10.0000 mg | INTRAVENOUS | Status: DC | PRN

## 2022-02-27 SURGICAL SUPPLY — 9 items
CATH 5FR JL3.5 JR4 ANG PIG MP (CATHETERS) IMPLANT
DEVICE RAD COMP TR BAND LRG (VASCULAR PRODUCTS) IMPLANT
GLIDESHEATH SLEND SS 6F .021 (SHEATH) IMPLANT
GUIDEWIRE INQWIRE 1.5J.035X260 (WIRE) IMPLANT
INQWIRE 1.5J .035X260CM (WIRE) ×1
KIT HEART LEFT (KITS) ×1 IMPLANT
PACK CARDIAC CATHETERIZATION (CUSTOM PROCEDURE TRAY) ×1 IMPLANT
TRANSDUCER W/STOPCOCK (MISCELLANEOUS) ×1 IMPLANT
TUBING CIL FLEX 10 FLL-RA (TUBING) ×1 IMPLANT

## 2022-02-27 NOTE — Interval H&P Note (Signed)
History and Physical Interval Note:  02/27/2022 12:23 PM  Danny [name]  has presented today for surgery, with the diagnosis of unstable angina.  The various methods of treatment have been discussed with the patient and family. After consideration of risks, benefits and other options for treatment, the patient has consented to  Procedure(s): LEFT HEART CATH AND CORONARY ANGIOGRAPHY (N/A) as a surgical intervention.  The patient's history has been reviewed, patient examined, no change in status, stable for surgery.  I have reviewed the patient's chart and labs.  Questions were answered to the patient's satisfaction.    Cath Lab Visit (complete for each Cath Lab visit)  Clinical Evaluation Leading to the Procedure:   ACS: No.  Non-ACS:    Anginal Classification: CCS II  Anti-ischemic medical therapy: Maximal Therapy (2 or more classes of medications)  Non-Invasive Test Results: No non-invasive testing performed  Prior CABG: No previous CABG        Verne Carrow

## 2022-02-27 NOTE — Progress Notes (Signed)
  Echocardiogram 2D Echocardiogram has been performed.  Janalyn Harder 02/27/2022, 3:14 PM

## 2022-02-28 ENCOUNTER — Encounter (HOSPITAL_COMMUNITY): Payer: Self-pay | Admitting: Cardiovascular Disease

## 2022-03-06 ENCOUNTER — Institutional Professional Consult (permissible substitution): Payer: Managed Care, Other (non HMO) | Admitting: Cardiothoracic Surgery

## 2022-03-06 ENCOUNTER — Encounter: Payer: Self-pay | Admitting: Cardiothoracic Surgery

## 2022-03-06 VITALS — BP 118/72 | HR 83 | Resp 20 | Ht 66.0 in | Wt 165.0 lb

## 2022-03-06 DIAGNOSIS — I2510 Atherosclerotic heart disease of native coronary artery without angina pectoris: Secondary | ICD-10-CM

## 2022-03-07 ENCOUNTER — Other Ambulatory Visit: Payer: Self-pay | Admitting: Cardiothoracic Surgery

## 2022-03-07 ENCOUNTER — Other Ambulatory Visit: Payer: Self-pay | Admitting: *Deleted

## 2022-03-07 ENCOUNTER — Encounter: Payer: Self-pay | Admitting: *Deleted

## 2022-03-07 DIAGNOSIS — R079 Chest pain, unspecified: Secondary | ICD-10-CM

## 2022-03-24 NOTE — H&P (View-Only) (Signed)
[address]       [place] [id number]             [phone]                    [name] Medical Record #[id number] Date of Birth: 1965-09-22  Referring: Kathleene HazelMcAlhany, Christopher D* Primary Care: Pcp, No Primary Cardiologist: Rollene RotundaJames Hochrein, MD  Chief Complaint:    Chief Complaint  Patient presents with   Coronary Artery Disease    Surgical consult, Cardiac Cath and ECHO 02/27/22    History of Present Illness:    Danny [name] 57 y.o. male seen in consideration for CABG  Per thorough primary cardiologist HPI "He had a history of PCI to distal RCA in 2006.  She had a severe in-stent restenosis by cardiac catheterization in February 2014 this was treated with a overlapping drug-eluting stent.  Patient had a cardiac catheterization on 01/05/2015 that showed 95% mid left circumflex lesion treated with a drug-eluting stent.  Echo obtained on 5/27 showed EF 65 to 70%, grade 1 DD, mild TR.  Cardiac catheterization obtained on 08/10/2017 showed 95% distal RCA lesion treated with 2.75 x 12 mm drug-eluting stent, EF 55 to 65%.   He was last in the hospital in Jan 2020 with unstable angina again.  He underwent cardiac catheterization with results as below.  He was found to have distal RCA stent restenosis.  He had PCI of the in-stent restenosis.   He came back for a second opinion after having chest pain and cardiac cath [facility]  [facility].  He lives in that area.  He was found to have occluded circumflex and they were unable to cross this.  The LAD had 50% mid stenosis.  RCA was dominant with multiple overlapping mid stents with diffuse in-stent restenosis.  The left circumflex apparently was crossed with a wire but they were unable to cross the chronic total occlusion in the distal edge of the stent at the OM 2.  There was a mid PTCA using a 2.0 x 10 mm semicompliant balloon.  The description is slightly confusing to be used.  He was managed with Ranexa.    He said he has continued to get this discomfort walking up an incline but only now after he eats a meal.  However, he wants to be more active.  He wants to do more than he is capable of doing right now without getting the chest discomfort.  He said he did have improvement [name] from [phone] twice a day of Ranexa.  He has not taken frequent sublingual nitroglycerin.  He is not having any resting symptoms.  He has had no PND apnea.  He had no new palpitations, presyncope or syncope.  At the visit with me he was having increasing chest discomfort with mild activity.  He is an active guy and likes to go hiking and he says he is getting this discomfort with mild walking up an incline particularly after he eats.  He continues to have symptoms despite starting November.  I did review his cath films with one of my interventional partners.  When he is difficult to assess particularly the LAD and circumflex territory.  He has high-grade stenosis proximal to a large posterolateral and PDA.  Likely he would be better revascularized with bypass but we need better images."  To my discussion he has chest discomfort with exertional activity. He has multiple  prior stents as documented as well. He is currently active and would like to be more active.      Past Medical History:  Diagnosis Date   CAD (coronary artery disease)    COSTAR study DES Stent to RCA 2006;   LHC  05/19/12: oLM 20%, LAD 40-50%, pD1 50%, mCFX 30-40%, mRCA stent with severe ISR and 70% beyond the stent, dRCA 50%, pPDA 50%, pPLA 30-40%.  EF 55-65%. =>  PCI:  Overlapping Promus DES x2 to the mid RCA ISR. [place] s/p DES to LCx  c.95% circ DES 10/16,  [place]    CAD in native artery, hx of multiple PCIs 08/19/2017   DM2 (diabetes mellitus, type 2) (HCC)    GERD (gastroesophageal reflux disease)    HLD (hyperlipidemia)    Obesity     Past Surgical History:  Procedure Laterality Date   CARDIAC CATHETERIZATION N/A 01/05/2015   Procedure: Left Heart Cath and  Coronary Angiography;  Surgeon: Peter M Swaziland, MD;  Location: [facility] INVASIVE CV LAB;  Service: Cardiovascular;  Laterality: N/A;   CARDIAC CATHETERIZATION  01/05/2015   Procedure: Coronary Stent Intervention;  Surgeon: Peter M Swaziland, MD;  Location: [facility] INVASIVE CV LAB;  Service: Cardiovascular;;   CORONARY ANGIOPLASTY WITH STENT PLACEMENT  05/19/2012   DES   to RCA   by Dr Excell Seltzer   CORONARY BALLOON ANGIOPLASTY N/A 04/02/2018   Procedure: CORONARY BALLOON ANGIOPLASTY;  Surgeon: Swaziland, Peter M, MD;  Location: [facility] INVASIVE CV LAB;  Service: Cardiovascular;  Laterality: N/A;   CORONARY STENT INTERVENTION N/A 08/18/2017   Procedure: CORONARY STENT INTERVENTION;  Surgeon: Marykay Lex, MD;  Location: [facility] INVASIVE CV LAB;  Service: Cardiovascular;  Laterality: N/A;   CORONARY STENT PLACEMENT  01/05/2015   mid cx  des   INTRAVASCULAR ULTRASOUND/IVUS N/A 04/02/2018   Procedure: Intravascular Ultrasound/IVUS;  Surgeon: Swaziland, Peter M, MD;  Location: [facility] INVASIVE CV LAB;  Service: Cardiovascular;  Laterality: N/A;   LEFT HEART CATH AND CORONARY ANGIOGRAPHY N/A 08/18/2017   Procedure: LEFT HEART CATH AND CORONARY ANGIOGRAPHY;  Surgeon: Marykay Lex, MD;  Location: [facility] INVASIVE CV LAB;  Service: Cardiovascular;  Laterality: N/A;   LEFT HEART CATH AND CORONARY ANGIOGRAPHY N/A 04/02/2018   Procedure: LEFT HEART CATH AND CORONARY ANGIOGRAPHY;  Surgeon: Swaziland, Peter M, MD;  Location: [facility] INVASIVE CV LAB;  Service: Cardiovascular;  Laterality: N/A;   LEFT HEART CATH AND CORONARY ANGIOGRAPHY N/A 02/27/2022   Procedure: LEFT HEART CATH AND CORONARY ANGIOGRAPHY;  Surgeon: Kathleene Hazel, MD;  Location: [facility] INVASIVE CV LAB;  Service: Cardiovascular;  Laterality: N/A;   PERCUTANEOUS CORONARY STENT INTERVENTION (PCI-S) N/A 05/19/2012   Procedure: PERCUTANEOUS CORONARY STENT INTERVENTION (PCI-S);  Surgeon: Tonny Bollman, MD;  Location: [facility] CATH LAB;  Service: Cardiovascular;  Laterality: N/A;    Family History   Problem Relation Age of Onset   Diabetes Mother    Coronary artery disease Father 93   Heart attack Father      Social History   Tobacco Use  Smoking Status Never  Smokeless Tobacco Never    Social History   Substance and Sexual Activity  Alcohol Use Yes   Comment: rare     Allergies  Allergen Reactions   Crestor [Rosuvastatin]     MUSCLE PAIN   Ampicillin Nausea And Vomiting    DID THE REACTION INVOLVE: Swelling of the face/tongue/throat, SOB, or low BP? No Sudden or severe rash/hives, skin peeling, or the inside of the mouth or nose? No  Did it require medical treatment? No When did it last happen?      childhood allergy  If all above answers are "NO", may proceed with cephalosporin use.    Penicillins     DID THE REACTION INVOLVE: Swelling of the face/tongue/throat, SOB, or low BP? Unknown Sudden or severe rash/hives, skin peeling, or the inside of the mouth or nose? Unknown Did it require medical treatment? Unknown When did it last happen?      childhood allergy If all above answers are "NO", may proceed with cephalosporin use.     Current Outpatient Medications  Medication Sig Dispense Refill   ARTIFICIAL TEAR SOLUTION OP Place 1 drop into both eyes daily as needed (dry eyes).     aspirin EC 81 MG EC tablet Take 1 tablet (81 mg total) by mouth daily.     atorvastatin (LIPITOR) 80 MG tablet TAKE 1 TABLET BY MOUTH DAILY 90 tablet 3   clopidogrel (PLAVIX) 75 MG tablet TAKE 1 TABLET EVERY DAY 90 tablet 1   Continuous Blood Gluc Sensor (FREESTYLE LIBRE 14 DAY SENSOR) MISC SMARTSIG:1 Topical Every 2 Weeks     escitalopram (LEXAPRO) 10 MG tablet Take 10 mg by mouth daily.     FARXIGA 10 MG TABS tablet Take 10 mg by mouth daily.     fenofibrate (TRICOR) 145 MG tablet Take 145 mg by mouth daily.     icosapent Ethyl (VASCEPA) 1 g capsule Take 2 capsules (2 g total) by mouth 2 (two) times daily. 360 capsule 3   isosorbide mononitrate (IMDUR) 60 MG 24 hr tablet Take 1  tablet (60 mg total) by mouth daily. 90 tablet 3   ketoconazole (NIZORAL) 2 % cream Apply 1 Application topically daily as needed for irritation.     metFORMIN (GLUCOPHAGE) 1000 MG tablet Take 1,000 mg by mouth 2 (two) times daily with a meal.     nitroGLYCERIN (NITROSTAT) 0.4 MG SL tablet PLACE 1 TABLET (0.4 MG TOTAL) UNDER THE TONGUE EVERY 5 (FIVE) MINUTES AS NEEDED FOR CHEST PAIN. 25 tablet 2   ranolazine (RANEXA) 1000 MG SR tablet Take 1,000 mg by mouth 2 (two) times daily.     TRESIBA FLEXTOUCH 200 UNIT/ML SOPN Inject 40 Units into the skin daily.  5   Vitamin D, Ergocalciferol, (DRISDOL) 1.25 MG (50000 UNIT) CAPS capsule Take 50,000 Units by mouth every 7 (seven) days.     No current facility-administered medications for this visit.    ROS 14 point ROS reviewed and negative except as per HPI   PHYSICAL EXAMINATION: BP 118/72 (BP Location: Left Arm, Patient Position: Sitting, Cuff Size: Normal)   Pulse 83   Resp 20   Ht 5\' 6"  (1.676 m)   Wt 165 lb (74.8 kg)   SpO2 97% Comment: RA  BMI 26.63 kg/m   Gen: NAD Neuro: Alert and oriented CV: RRR Resp: Nonlaboured Abd: Soft, ntnd Extr: WWP  Diagnostic Studies & Laboratory data:     Recent Radiology Findings:   ECHOCARDIOGRAM COMPLETE  Result Date: 02/27/2022    ECHOCARDIOGRAM REPORT   Patient Name:   Danny [name] Date of Exam: 02/27/2022 Medical Rec #:  [id number]     Height:       66.0 in Accession #:    [id number]    Weight:       160.0 lb Date of Birth:  07/17/1965    BSA:          1.819 m Patient Age:  55 years      BP:           107/75 mmHg Patient Gender: M             HR:           66 bpm. Exam Location:  Inpatient Procedure: 2D Echo, 3D Echo, Cardiac Doppler and Color Doppler Indications:    I25.110 Atherosclerotic heart disease of native coronary artery                 with unstable angina pectoris; Z01.818 Encounter for other                 preprocedural examination  History:        Patient has prior history of  Echocardiogram examinations, most                 recent 08/17/2017. CAD, Signs/Symptoms:Hypotension; Risk                 Factors:Diabetes and Dyslipidemia.  Sonographer:    Sheralyn Boatman RDCS Referring Phys: [id number] CHRISTOPHER D MCALHANY IMPRESSIONS  1. Left ventricular ejection fraction, by estimation, is 60 to 65%. The left ventricle has normal function. The left ventricle has no regional wall motion abnormalities. Left ventricular diastolic parameters are indeterminate.  2. Right ventricular systolic function is normal. The right ventricular size is normal. Tricuspid regurgitation signal is inadequate for assessing PA pressure.  3. The mitral valve is normal in structure. No evidence of mitral valve regurgitation. No evidence of mitral stenosis.  4. The aortic valve is normal in structure. Aortic valve regurgitation is not visualized. No aortic stenosis is present.  5. The inferior vena cava is normal in size with greater than 50% respiratory variability, suggesting right atrial pressure of 3 mmHg. FINDINGS  Left Ventricle: Left ventricular ejection fraction, by estimation, is 60 to 65%. The left ventricle has normal function. The left ventricle has no regional wall motion abnormalities. The left ventricular internal cavity size was normal in size. There is  no left ventricular hypertrophy. Left ventricular diastolic parameters are indeterminate. Right Ventricle: The right ventricular size is normal. No increase in right ventricular wall thickness. Right ventricular systolic function is normal. Tricuspid regurgitation signal is inadequate for assessing PA pressure. Left Atrium: Left atrial size was normal in size. Right Atrium: Right atrial size was normal in size. Pericardium: There is no evidence of pericardial effusion. Presence of epicardial fat layer. Mitral Valve: The mitral valve is normal in structure. No evidence of mitral valve regurgitation. No evidence of mitral valve stenosis. Tricuspid Valve: The  tricuspid valve is normal in structure. Tricuspid valve regurgitation is not demonstrated. No evidence of tricuspid stenosis. Aortic Valve: The aortic valve is normal in structure. Aortic valve regurgitation is not visualized. No aortic stenosis is present. Pulmonic Valve: The pulmonic valve was normal in structure. Pulmonic valve regurgitation is not visualized. No evidence of pulmonic stenosis. Aorta: The aortic root is normal in size and structure. Venous: The inferior vena cava is normal in size with greater than 50% respiratory variability, suggesting right atrial pressure of 3 mmHg. IAS/Shunts: No atrial level shunt detected by color flow Doppler.  LEFT VENTRICLE PLAX 2D LVIDd:         3.70 cm     Diastology LVIDs:         2.50 cm     LV e' medial:    7.94 cm/s LV PW:         1.20  cm     LV E/e' medial:  8.1 LV IVS:        1.00 cm     LV e' lateral:   14.30 cm/s LVOT diam:     2.40 cm     LV E/e' lateral: 4.5 LV SV:         92 LV SV Index:   50 LVOT Area:     4.52 cm  LV Volumes (MOD) LV vol d, MOD A2C: 55.5 ml LV vol d, MOD A4C: 60.2 ml LV vol s, MOD A2C: 17.7 ml LV vol s, MOD A4C: 24.1 ml LV SV MOD A2C:     37.8 ml LV SV MOD A4C:     60.2 ml LV SV MOD BP:      37.0 ml RIGHT VENTRICLE             IVC RV S prime:     10.90 cm/s  IVC diam: 1.80 cm TAPSE (M-mode): 2.2 cm LEFT ATRIUM             Index        RIGHT ATRIUM          Index LA diam:        4.00 cm 2.20 cm/m   RA Area:     9.46 cm LA Vol (A2C):   19.9 ml 10.94 ml/m  RA Volume:   15.70 ml 8.63 ml/m LA Vol (A4C):   37.7 ml 20.73 ml/m LA Biplane Vol: 29.0 ml 15.94 ml/m  AORTIC VALVE LVOT Vmax:   85.00 cm/s LVOT Vmean:  58.600 cm/s LVOT VTI:    0.203 m  AORTA Ao Root diam: 3.50 cm Ao Asc diam:  3.30 cm MITRAL VALVE MV Area (PHT): 2.95 cm    SHUNTS MV Decel Time: 257 msec    Systemic VTI:  0.20 m MV E velocity: 64.55 cm/s  Systemic Diam: 2.40 cm MV A velocity: 66.00 cm/s MV E/A ratio:  0.98 Kardie Tobb DO Electronically signed by Thomasene Ripple DO  Signature Date/Time: 02/27/2022/3:34:13 PM    Final    CARDIAC CATHETERIZATION  Result Date: 02/27/2022   Prox RCA lesion is 70% stenosed.   Mid RCA lesion is 60% stenosed.   Dist RCA lesion is 80% stenosed.   RPAV lesion is 90% stenosed.   RPDA lesion is 70% stenosed.   2nd Mrg lesion is 100% stenosed.   1st Mrg lesion is 90% stenosed.   Mid LAD lesion is 70% stenosed.   Ost LAD to Prox LAD lesion is 60% stenosed.   Dist LAD lesion is 90% stenosed.   1st Diag lesion is 60% stenosed.   The left ventricular systolic function is normal.   LV end diastolic pressure is normal.   The left ventricular ejection fraction is greater than 65% by visual estimate.   There is no mitral valve regurgitation. Severe three vessel CAD Severe disease in the proximal and mid LAD. Severe disease moderate caliber diagona Severe disease obtuse marginal 1. Chronic total occlusion of the stented segment of obtuse marginal 2 with collateral filling of this branch Large dominant RCA with prior stenting of the proximal, mid and distal RCA. Moderately severe restenosis proximal and mid RCA stented segment. Severe restenosis distal RCA stented segment. Severe stenosis proximal segment of the moderate caliber posterolateral artery. Severe disease proximal segment of moderate caliber PDA Normal LV systolic function Recommendations: Will refer to CT surgery for CABG. Echo today before discharge. Continue medical therapy for now while  being evaluated for bypass.       I have independently reviewed the above radiology studies  and reviewed the findings with the patient.   Recent Lab Findings: Lab Results  Component Value Date   WBC 8.2 02/19/2022   HGB 14.7 02/19/2022   HCT 43.5 02/19/2022   PLT 226 02/19/2022   GLUCOSE 252 (H) 02/19/2022   CHOL 87 (L) 09/28/2018   TRIG 157 (H) 09/28/2018   HDL 28 (L) 09/28/2018   LDLDIRECT 38 09/28/2018   LDLCALC 28 09/28/2018   ALT 14 02/19/2022   AST 18 02/19/2022   NA 141 02/19/2022   K  4.7 02/19/2022   CL 101 02/19/2022   CREATININE 0.93 02/19/2022   BUN 12 02/19/2022   CO2 24 02/19/2022   TSH 1.298 01/05/2015   INR 1.10 08/18/2017   HGBA1C 8.2 (H) 02/19/2022     Assessment / Plan:   Danny [name] 57 y.o. male seen in consideration for CABG.  He has severe, symptomatic 3v CAD. He has prior PCI to the RCA and OM. EF is normal and no significant valvular heart disease.   His comorbidities include DM, HL, GERD and prior extensive PCI.  He would benefit from coronary bypass. He needs at a minimum a graft to each wall - inferior, lateral, and anterior. Planning on LIMA - LAD, SVG-PDA/PLA, SVG-OM2, possible SVG - Diag.   Risks/benefits/alternatives to bypass discussed at length (92% standard recovery, 6% morbidity - any organ, in particular perioperative MI given degree of prior PCI, 2% mortality). All questions asked and answered.     I  spent 30 minutes with  the patient face to face in counseling and coordination of care.    Pierre Bali Jameshia Hayashida 03/24/2022 6:23 PM

## 2022-03-24 NOTE — Progress Notes (Signed)
[address]       [place] [id number]             [phone]                    [name] Medical Record #[id number] Date of Birth: 1965/12/20  Referring: Kathleene HazelMcAlhany, Christopher D* Primary Care: Pcp, No Primary Cardiologist: Rollene RotundaJames Hochrein, MD  Chief Complaint:    Chief Complaint  Patient presents with   Coronary Artery Disease    Surgical consult, Cardiac Cath and ECHO 02/27/22    History of Present Illness:    Danny [name] 57 y.o. male seen in consideration for CABG  Per thorough primary cardiologist HPI "He had a history of PCI to distal RCA in 2006.  She had a severe in-stent restenosis by cardiac catheterization in February 2014 this was treated with a overlapping drug-eluting stent.  Patient had a cardiac catheterization on 01/05/2015 that showed 95% mid left circumflex lesion treated with a drug-eluting stent.  Echo obtained on 5/27 showed EF 65 to 70%, grade 1 DD, mild TR.  Cardiac catheterization obtained on 08/10/2017 showed 95% distal RCA lesion treated with 2.75 x 12 mm drug-eluting stent, EF 55 to 65%.   He was last in the hospital in Jan 2020 with unstable angina again.  He underwent cardiac catheterization with results as below.  He was found to have distal RCA stent restenosis.  He had PCI of the in-stent restenosis.   He came back for a second opinion after having chest [facility] and [facility].  He lives in that area.  He was found to have occluded circumflex and they were unable to cross this.  The LAD had 50% mid stenosis.  RCA was dominant with multiple overlapping mid stents with diffuse in-stent restenosis.  The left circumflex apparently was crossed with a wire but they were unable to cross the chronic total occlusion in the distal edge of the stent at the OM 2.  There was a mid PTCA using a 2.0 x 10 mm semicompliant balloon.  The description is slightly confusing to be used.  He was managed with Ranexa.    He said he has continued to get this discomfort walking up an incline but only now after he eats a meal.  However, he wants to be more active.  He wants to do more than he is capable of doing right now without getting the chest discomfort.  He said he did have improvement [name] from [phone] twice a day of Ranexa.  He has not taken frequent sublingual nitroglycerin.  He is not having any resting symptoms.  He has had no PND apnea.  He had no new palpitations, presyncope or syncope.  At the visit with me he was having increasing chest discomfort with mild activity.  He is an active guy and likes to go hiking and he says he is getting this discomfort with mild walking up an incline particularly after he eats.  He continues to have symptoms despite starting November.  I did review his cath films with one of my interventional partners.  When he is difficult to assess particularly the LAD and circumflex territory.  He has high-grade stenosis proximal to a large posterolateral and PDA.  Likely he would be better revascularized with bypass but we need better images."  To my discussion he has chest discomfort with exertional activity. He has multiple  prior stents as documented as well. He is currently active and would like to be more active.      Past Medical History:  Diagnosis Date   CAD (coronary artery disease)    COSTAR study DES Stent to RCA 2006;   LHC  05/19/12: oLM 20%, LAD 40-50%, pD1 50%, mCFX 30-40%, mRCA stent with severe ISR and 70% beyond the stent, dRCA 50%, pPDA 50%, pPLA 30-40%.  EF 55-65%. =>  PCI:  Overlapping Promus DES x2 to the mid RCA ISR. [place] s/p DES to LCx  c.95% circ DES 10/16,  [place]    CAD in native artery, hx of multiple PCIs 08/19/2017   DM2 (diabetes mellitus, type 2) (HCC)    GERD (gastroesophageal reflux disease)    HLD (hyperlipidemia)    Obesity     Past Surgical History:  Procedure Laterality Date   CARDIAC CATHETERIZATION N/A 01/05/2015   Procedure: Left Heart Cath and  Coronary Angiography;  Surgeon: Peter M Swaziland, MD;  Location: [facility] INVASIVE CV LAB;  Service: Cardiovascular;  Laterality: N/A;   CARDIAC CATHETERIZATION  01/05/2015   Procedure: Coronary Stent Intervention;  Surgeon: Peter M Swaziland, MD;  Location: [facility] INVASIVE CV LAB;  Service: Cardiovascular;;   CORONARY ANGIOPLASTY WITH STENT PLACEMENT  05/19/2012   DES   to RCA   by Dr Excell Seltzer   CORONARY BALLOON ANGIOPLASTY N/A 04/02/2018   Procedure: CORONARY BALLOON ANGIOPLASTY;  Surgeon: Swaziland, Peter M, MD;  Location: [facility] INVASIVE CV LAB;  Service: Cardiovascular;  Laterality: N/A;   CORONARY STENT INTERVENTION N/A 08/18/2017   Procedure: CORONARY STENT INTERVENTION;  Surgeon: Marykay Lex, MD;  Location: [facility] INVASIVE CV LAB;  Service: Cardiovascular;  Laterality: N/A;   CORONARY STENT PLACEMENT  01/05/2015   mid cx  des   INTRAVASCULAR ULTRASOUND/IVUS N/A 04/02/2018   Procedure: Intravascular Ultrasound/IVUS;  Surgeon: Swaziland, Peter M, MD;  Location: [facility] INVASIVE CV LAB;  Service: Cardiovascular;  Laterality: N/A;   LEFT HEART CATH AND CORONARY ANGIOGRAPHY N/A 08/18/2017   Procedure: LEFT HEART CATH AND CORONARY ANGIOGRAPHY;  Surgeon: Marykay Lex, MD;  Location: [facility] INVASIVE CV LAB;  Service: Cardiovascular;  Laterality: N/A;   LEFT HEART CATH AND CORONARY ANGIOGRAPHY N/A 04/02/2018   Procedure: LEFT HEART CATH AND CORONARY ANGIOGRAPHY;  Surgeon: Swaziland, Peter M, MD;  Location: [facility] INVASIVE CV LAB;  Service: Cardiovascular;  Laterality: N/A;   LEFT HEART CATH AND CORONARY ANGIOGRAPHY N/A 02/27/2022   Procedure: LEFT HEART CATH AND CORONARY ANGIOGRAPHY;  Surgeon: Kathleene Hazel, MD;  Location: [facility] INVASIVE CV LAB;  Service: Cardiovascular;  Laterality: N/A;   PERCUTANEOUS CORONARY STENT INTERVENTION (PCI-S) N/A 05/19/2012   Procedure: PERCUTANEOUS CORONARY STENT INTERVENTION (PCI-S);  Surgeon: Tonny Bollman, MD;  Location: [facility] CATH LAB;  Service: Cardiovascular;  Laterality: N/A;    Family History   Problem Relation Age of Onset   Diabetes Mother    Coronary artery disease Father 93   Heart attack Father      Social History   Tobacco Use  Smoking Status Never  Smokeless Tobacco Never    Social History   Substance and Sexual Activity  Alcohol Use Yes   Comment: rare     Allergies  Allergen Reactions   Crestor [Rosuvastatin]     MUSCLE PAIN   Ampicillin Nausea And Vomiting    DID THE REACTION INVOLVE: Swelling of the face/tongue/throat, SOB, or low BP? No Sudden or severe rash/hives, skin peeling, or the inside of the mouth or nose? No  Did it require medical treatment? No When did it last happen?      childhood allergy  If all above answers are "NO", may proceed with cephalosporin use.    Penicillins     DID THE REACTION INVOLVE: Swelling of the face/tongue/throat, SOB, or low BP? Unknown Sudden or severe rash/hives, skin peeling, or the inside of the mouth or nose? Unknown Did it require medical treatment? Unknown When did it last happen?      childhood allergy If all above answers are "NO", may proceed with cephalosporin use.     Current Outpatient Medications  Medication Sig Dispense Refill   ARTIFICIAL TEAR SOLUTION OP Place 1 drop into both eyes daily as needed (dry eyes).     aspirin EC 81 MG EC tablet Take 1 tablet (81 mg total) by mouth daily.     atorvastatin (LIPITOR) 80 MG tablet TAKE 1 TABLET BY MOUTH DAILY 90 tablet 3   clopidogrel (PLAVIX) 75 MG tablet TAKE 1 TABLET EVERY DAY 90 tablet 1   Continuous Blood Gluc Sensor (FREESTYLE LIBRE 14 DAY SENSOR) MISC SMARTSIG:1 Topical Every 2 Weeks     escitalopram (LEXAPRO) 10 MG tablet Take 10 mg by mouth daily.     FARXIGA 10 MG TABS tablet Take 10 mg by mouth daily.     fenofibrate (TRICOR) 145 MG tablet Take 145 mg by mouth daily.     icosapent Ethyl (VASCEPA) 1 g capsule Take 2 capsules (2 g total) by mouth 2 (two) times daily. 360 capsule 3   isosorbide mononitrate (IMDUR) 60 MG 24 hr tablet Take 1  tablet (60 mg total) by mouth daily. 90 tablet 3   ketoconazole (NIZORAL) 2 % cream Apply 1 Application topically daily as needed for irritation.     metFORMIN (GLUCOPHAGE) 1000 MG tablet Take 1,000 mg by mouth 2 (two) times daily with a meal.     nitroGLYCERIN (NITROSTAT) 0.4 MG SL tablet PLACE 1 TABLET (0.4 MG TOTAL) UNDER THE TONGUE EVERY 5 (FIVE) MINUTES AS NEEDED FOR CHEST PAIN. 25 tablet 2   ranolazine (RANEXA) 1000 MG SR tablet Take 1,000 mg by mouth 2 (two) times daily.     TRESIBA FLEXTOUCH 200 UNIT/ML SOPN Inject 40 Units into the skin daily.  5   Vitamin D, Ergocalciferol, (DRISDOL) 1.25 MG (50000 UNIT) CAPS capsule Take 50,000 Units by mouth every 7 (seven) days.     No current facility-administered medications for this visit.    ROS 14 point ROS reviewed and negative except as per HPI   PHYSICAL EXAMINATION: BP 118/72 (BP Location: Left Arm, Patient Position: Sitting, Cuff Size: Normal)   Pulse 83   Resp 20   Ht 5\' 6"  (1.676 m)   Wt 165 lb (74.8 kg)   SpO2 97% Comment: RA  BMI 26.63 kg/m   Gen: NAD Neuro: Alert and oriented CV: RRR Resp: Nonlaboured Abd: Soft, ntnd Extr: WWP  Diagnostic Studies & Laboratory data:     Recent Radiology Findings:   ECHOCARDIOGRAM COMPLETE  Result Date: 02/27/2022    ECHOCARDIOGRAM REPORT   Patient Name:   Danny [name] [name] Date of Exam: 02/27/2022 Medical Rec #:  [id number]     Height:       66.0 in Accession #:    [id number]    Weight:       160.0 lb Date of Birth:  07/17/1965    BSA:          1.819 m Patient Age:  55 years      BP:           107/75 mmHg Patient Gender: M             HR:           66 bpm. Exam Location:  Inpatient Procedure: 2D Echo, 3D Echo, Cardiac Doppler and Color Doppler Indications:    I25.110 Atherosclerotic heart disease of native coronary artery                 with unstable angina pectoris; Z01.818 Encounter for other                 preprocedural examination  History:        Patient has prior history of  Echocardiogram examinations, most                 recent 08/17/2017. CAD, Signs/Symptoms:Hypotension; Risk                 Factors:Diabetes and Dyslipidemia.  Sonographer:    Sheralyn Boatman RDCS Referring Phys: [id number] CHRISTOPHER D MCALHANY IMPRESSIONS  1. Left ventricular ejection fraction, by estimation, is 60 to 65%. The left ventricle has normal function. The left ventricle has no regional wall motion abnormalities. Left ventricular diastolic parameters are indeterminate.  2. Right ventricular systolic function is normal. The right ventricular size is normal. Tricuspid regurgitation signal is inadequate for assessing PA pressure.  3. The mitral valve is normal in structure. No evidence of mitral valve regurgitation. No evidence of mitral stenosis.  4. The aortic valve is normal in structure. Aortic valve regurgitation is not visualized. No aortic stenosis is present.  5. The inferior vena cava is normal in size with greater than 50% respiratory variability, suggesting right atrial pressure of 3 mmHg. FINDINGS  Left Ventricle: Left ventricular ejection fraction, by estimation, is 60 to 65%. The left ventricle has normal function. The left ventricle has no regional wall motion abnormalities. The left ventricular internal cavity size was normal in size. There is  no left ventricular hypertrophy. Left ventricular diastolic parameters are indeterminate. Right Ventricle: The right ventricular size is normal. No increase in right ventricular wall thickness. Right ventricular systolic function is normal. Tricuspid regurgitation signal is inadequate for assessing PA pressure. Left Atrium: Left atrial size was normal in size. Right Atrium: Right atrial size was normal in size. Pericardium: There is no evidence of pericardial effusion. Presence of epicardial fat layer. Mitral Valve: The mitral valve is normal in structure. No evidence of mitral valve regurgitation. No evidence of mitral valve stenosis. Tricuspid Valve: The  tricuspid valve is normal in structure. Tricuspid valve regurgitation is not demonstrated. No evidence of tricuspid stenosis. Aortic Valve: The aortic valve is normal in structure. Aortic valve regurgitation is not visualized. No aortic stenosis is present. Pulmonic Valve: The pulmonic valve was normal in structure. Pulmonic valve regurgitation is not visualized. No evidence of pulmonic stenosis. Aorta: The aortic root is normal in size and structure. Venous: The inferior vena cava is normal in size with greater than 50% respiratory variability, suggesting right atrial pressure of 3 mmHg. IAS/Shunts: No atrial level shunt detected by color flow Doppler.  LEFT VENTRICLE PLAX 2D LVIDd:         3.70 cm     Diastology LVIDs:         2.50 cm     LV e' medial:    7.94 cm/s LV PW:         1.20  cm     LV E/e' medial:  8.1 LV IVS:        1.00 cm     LV e' lateral:   14.30 cm/s LVOT diam:     2.40 cm     LV E/e' lateral: 4.5 LV SV:         92 LV SV Index:   50 LVOT Area:     4.52 cm  LV Volumes (MOD) LV vol d, MOD A2C: 55.5 ml LV vol d, MOD A4C: 60.2 ml LV vol s, MOD A2C: 17.7 ml LV vol s, MOD A4C: 24.1 ml LV SV MOD A2C:     37.8 ml LV SV MOD A4C:     60.2 ml LV SV MOD BP:      37.0 ml RIGHT VENTRICLE             IVC RV S prime:     10.90 cm/s  IVC diam: 1.80 cm TAPSE (M-mode): 2.2 cm LEFT ATRIUM             Index        RIGHT ATRIUM          Index LA diam:        4.00 cm 2.20 cm/m   RA Area:     9.46 cm LA Vol (A2C):   19.9 ml 10.94 ml/m  RA Volume:   15.70 ml 8.63 ml/m LA Vol (A4C):   37.7 ml 20.73 ml/m LA Biplane Vol: 29.0 ml 15.94 ml/m  AORTIC VALVE LVOT Vmax:   85.00 cm/s LVOT Vmean:  58.600 cm/s LVOT VTI:    0.203 m  AORTA Ao Root diam: 3.50 cm Ao Asc diam:  3.30 cm MITRAL VALVE MV Area (PHT): 2.95 cm    SHUNTS MV Decel Time: 257 msec    Systemic VTI:  0.20 m MV E velocity: 64.55 cm/s  Systemic Diam: 2.40 cm MV A velocity: 66.00 cm/s MV E/A ratio:  0.98 Kardie Tobb DO Electronically signed by Thomasene Ripple DO  Signature Date/Time: 02/27/2022/3:34:13 PM    Final    CARDIAC CATHETERIZATION  Result Date: 02/27/2022   Prox RCA lesion is 70% stenosed.   Mid RCA lesion is 60% stenosed.   Dist RCA lesion is 80% stenosed.   RPAV lesion is 90% stenosed.   RPDA lesion is 70% stenosed.   2nd Mrg lesion is 100% stenosed.   1st Mrg lesion is 90% stenosed.   Mid LAD lesion is 70% stenosed.   Ost LAD to Prox LAD lesion is 60% stenosed.   Dist LAD lesion is 90% stenosed.   1st Diag lesion is 60% stenosed.   The left ventricular systolic function is normal.   LV end diastolic pressure is normal.   The left ventricular ejection fraction is greater than 65% by visual estimate.   There is no mitral valve regurgitation. Severe three vessel CAD Severe disease in the proximal and mid LAD. Severe disease moderate caliber diagona Severe disease obtuse marginal 1. Chronic total occlusion of the stented segment of obtuse marginal 2 with collateral filling of this branch Large dominant RCA with prior stenting of the proximal, mid and distal RCA. Moderately severe restenosis proximal and mid RCA stented segment. Severe restenosis distal RCA stented segment. Severe stenosis proximal segment of the moderate caliber posterolateral artery. Severe disease proximal segment of moderate caliber PDA Normal LV systolic function Recommendations: Will refer to CT surgery for CABG. Echo today before discharge. Continue medical therapy for now while  being evaluated for bypass.       I have independently reviewed the above radiology studies  and reviewed the findings with the patient.   Recent Lab Findings: Lab Results  Component Value Date   WBC 8.2 02/19/2022   HGB 14.7 02/19/2022   HCT 43.5 02/19/2022   PLT 226 02/19/2022   GLUCOSE 252 (H) 02/19/2022   CHOL 87 (L) 09/28/2018   TRIG 157 (H) 09/28/2018   HDL 28 (L) 09/28/2018   LDLDIRECT 38 09/28/2018   LDLCALC 28 09/28/2018   ALT 14 02/19/2022   AST 18 02/19/2022   NA 141 02/19/2022   K  4.7 02/19/2022   CL 101 02/19/2022   CREATININE 0.93 02/19/2022   BUN 12 02/19/2022   CO2 24 02/19/2022   TSH 1.298 01/05/2015   INR 1.10 08/18/2017   HGBA1C 8.2 (H) 02/19/2022     Assessment / Plan:   Danny [name] [name] 57 y.o. male seen in consideration for CABG.  He has severe, symptomatic 3v CAD. He has prior PCI to the RCA and OM. EF is normal and no significant valvular heart disease.   His comorbidities include DM, HL, GERD and prior extensive PCI.  He would benefit from coronary bypass. He needs at a minimum a graft to each wall - inferior, lateral, and anterior. Planning on LIMA - LAD, SVG-PDA/PLA, SVG-OM2, possible SVG - Diag.   Risks/benefits/alternatives to bypass discussed at length (92% standard recovery, 6% morbidity - any organ, in particular perioperative MI given degree of prior PCI, 2% mortality). All questions asked and answered.     I  spent 30 minutes with  the patient face to face in counseling and coordination of care.    Pierre Bali Jaxsyn Catalfamo 03/24/2022 6:23 PM

## 2022-03-25 NOTE — Progress Notes (Signed)
Surgical Instructions    Your procedure is scheduled on Friday, January 12.  Report to [facility] Main Entrance "A" at 5:30 A.M., then check in with the Admitting office.  Call this number if you have problems the morning of surgery:  [phone]   If you have any questions prior to your surgery date call [phone]: Open Monday-Friday 8am-4pm If you experience any cold or flu symptoms such as cough, fever, chills, shortness of breath, etc. between now and your scheduled surgery, please notify us at the above number     Remember:  Do not eat or drink after midnight the night before your surgery    Take these medicines the morning of surgery with A SIP OF WATER:  atorvastatin (LIPITOR)  escitalopram (LEXAPRO)  fenofibrate (TRICOR)  isosorbide mononitrate (IMDUR)  ranolazine (RANEXA)   Per your surgeon's instructions:  **STOP PLAVIX 1 WEEK PRIOR TO SURGERY** **STOP FARXIGA 3 DAYS PRIOR TO SURGERY**(Your last dose of Wilder Glade will be on 03/31/21)  **STOP METFORMIN 2 DAYS PRIOR TO SURGERY**  As of today, STOP taking any Aleve, Naproxen, Ibuprofen, Motrin, Advil, Goody's, BC's, all herbal medications, fish oil, and all vitamins.  WHAT DO I DO ABOUT MY DIABETES MEDICATION?   Do not take oral diabetes medicines (pills) the morning of surgery. DO NOT TAKE Metformin (Glucophage) or Farxiga the day of surgery.   THE NIGHT BEFORE SURGERY, take 22 units (half dose) of Tresiba insulin.  (If you normally take this medication at night)      THE MORNING OF SURGERY,  take 22 units (half dose) of Tresiba insulin. (If you normally take this medication in the morning)   The day of surgery, do not take other diabetes injectables, including Byetta (exenatide), Bydureon (exenatide ER), Victoza (liraglutide), or Trulicity (dulaglutide).  If your CBG is greater than 220 mg/dL, you may take  of your sliding scale (correction) dose of insulin.   HOW TO MANAGE YOUR DIABETES BEFORE AND AFTER  SURGERY  Why is it important to control my blood sugar before and after surgery? Improving blood sugar levels before and after surgery helps healing and can limit problems. A way of improving blood sugar control is eating a healthy diet by:  Eating less sugar and carbohydrates  Increasing activity/exercise  Talking with your doctor about reaching your blood sugar goals High blood sugars (greater than 180 mg/dL) can raise your risk of infections and slow your recovery, so you will need to focus on controlling your diabetes during the weeks before surgery. Make sure that the doctor who takes care of your diabetes knows about your planned surgery including the date and location.  How do I manage my blood sugar before surgery? Check your blood sugar at least 4 times a day, starting 2 days before surgery, to make sure that the level is not too high or low.  Check your blood sugar the morning of your surgery when you wake up and every 2 hours until you get to the Short Stay unit.  If your blood sugar is less than 70 mg/dL, you will need to treat for low blood sugar: Do not take insulin. Treat a low blood sugar (less than 70 mg/dL) with  cup of clear juice (cranberry or apple), 4 glucose tablets, OR glucose gel. Recheck blood sugar in 15 minutes after treatment (to make sure it is greater than 70 mg/dL). If your blood sugar is not greater than 70 mg/dL on recheck, call [phone] for further instructions. Report your blood  sugar to the short stay nurse when you get to Short Stay.  If you are admitted to the hospital after surgery: Your blood sugar will be checked by the staff and you will probably be given insulin after surgery (instead of oral diabetes medicines) to make sure you have good blood sugar levels. The goal for blood sugar control after surgery is 80-180 mg/dL.              [facility] is not responsible for any belongings or valuables.    Do NOT Smoke (Tobacco/Vaping)  24  hours prior to your procedure  If you use a CPAP at night, you may bring your mask for your overnight stay.   Contacts, glasses, hearing aids, dentures or partials may not be worn into surgery, please bring cases for these belongings   For patients admitted to the hospital, discharge time will be determined by your treatment team.   Patients discharged the day of surgery will not be allowed to drive home, and someone needs to stay with them for 24 hours.   SURGICAL WAITING ROOM VISITATION Patients having surgery or a procedure may have no more than 2 support people in the waiting area - these visitors may rotate.   Children under the age of 20 must have an adult with them who is not the patient. If the patient needs to stay at the hospital during part of their recovery, the visitor guidelines for inpatient rooms apply. Pre-op nurse will coordinate an appropriate time for 1 support person to accompany patient in pre-op.  This support person may not rotate.   Please refer to [email or web address] for the visitor guidelines for Inpatients (after your surgery is over and you are in a regular room).    Special instructions:    Oral Hygiene is also important to reduce your risk of infection.  Remember - BRUSH YOUR TEETH THE MORNING OF SURGERY WITH YOUR REGULAR TOOTHPASTE   [place]- Preparing For Surgery  Before surgery, you can play an important role. Because skin is not sterile, your skin needs to be as free of germs as possible. You can reduce the number of germs on your skin by washing with CHG (chlorahexidine gluconate) Soap before surgery.  CHG is an antiseptic cleaner which kills germs and bonds with the skin to continue killing germs even after washing.     Please do not use if you have an allergy to CHG or antibacterial soaps. If your skin becomes reddened/irritated stop using the CHG.  Do not shave (including legs and underarms)  for at least 48 hours prior to first CHG shower. It is OK to shave your face.  Please follow these instructions carefully.     Shower the NIGHT BEFORE SURGERY and the MORNING OF SURGERY with CHG Soap.   If you chose to wash your hair, wash your hair first as usual with your normal shampoo. After you shampoo, rinse your hair and body thoroughly to remove the shampoo.  Then [employer] and genitals (private parts) with your normal soap and rinse thoroughly to remove soap.  After that Use CHG Soap as you would any other liquid soap. You can apply CHG directly to the skin and wash gently with a scrungie or a clean washcloth.   Apply the CHG Soap to your body ONLY FROM THE NECK DOWN.  Do not use on open wounds or open sores. Avoid contact with your eyes, ears, mouth and genitals (private parts). Wash Face and genitals (  private parts)  with your normal soap.   Wash thoroughly, paying special attention to the area where your surgery will be performed.  Thoroughly rinse your body with warm water from the neck down.  DO NOT shower/wash with your normal soap after using and rinsing off the CHG Soap.  Pat yourself dry with a CLEAN TOWEL.  Wear CLEAN PAJAMAS to bed the night before surgery  Place CLEAN SHEETS on your bed the night before your surgery  DO NOT SLEEP WITH PETS.   Day of Surgery:  Take a shower with CHG soap. Wear Clean/Comfortable clothing the morning of surgery Do not wear jewelry or makeup. Do not wear lotions, powders, perfumes/cologne or deodorant. Do not shave 48 hours prior to surgery.  Men may shave face and neck. Do not bring valuables to the hospital. Do not wear nail polish, gel polish, artificial nails, or any other type of covering on natural nails (fingers and toes) If you have artificial nails or gel coating that need to be removed by a nail salon, please have this removed prior to surgery. Artificial nails or gel coating may interfere with anesthesia's ability to  adequately monitor your vital signs. Remember to brush your teeth WITH YOUR REGULAR TOOTHPASTE.    If you received a COVID test during your pre-op visit, it is requested that you wear a mask when out in public, stay away from anyone that may not be feeling well, and notify your surgeon if you develop symptoms. If you have been in contact with anyone that has tested positive in the last 10 days, please notify your surgeon.    Please read over the following fact sheets that you were given.

## 2022-03-26 ENCOUNTER — Ambulatory Visit (HOSPITAL_COMMUNITY)
Admission: RE | Admit: 2022-03-26 | Discharge: 2022-03-26 | Disposition: A | Discharge status: Home / Self Care | Payer: Managed Care, Other (non HMO) | Source: Ambulatory Visit | Attending: Cardiothoracic Surgery

## 2022-03-26 ENCOUNTER — Other Ambulatory Visit: Payer: Self-pay

## 2022-03-26 ENCOUNTER — Ambulatory Visit (HOSPITAL_COMMUNITY)
Admission: RE | Admit: 2022-03-26 | Discharge: 2022-03-26 | Disposition: A | Discharge status: Home / Self Care | Payer: Managed Care, Other (non HMO) | Source: Ambulatory Visit | Attending: Cardiothoracic Surgery | Admitting: Cardiothoracic Surgery

## 2022-03-26 ENCOUNTER — Encounter (HOSPITAL_COMMUNITY): Payer: Self-pay

## 2022-03-26 ENCOUNTER — Encounter (HOSPITAL_COMMUNITY)
Admission: RE | Admit: 2022-03-26 | Discharge: 2022-03-26 | Disposition: A | Discharge status: Home / Self Care | Payer: Managed Care, Other (non HMO) | Source: Ambulatory Visit | Attending: Cardiothoracic Surgery | Admitting: Cardiothoracic Surgery

## 2022-03-26 VITALS — BP 126/80 | HR 81 | Temp 98.0°F | Resp 18 | Ht 66.0 in | Wt 168.8 lb

## 2022-03-26 DIAGNOSIS — Z01818 Encounter for other preprocedural examination: Secondary | ICD-10-CM

## 2022-03-26 DIAGNOSIS — R079 Chest pain, unspecified: Secondary | ICD-10-CM

## 2022-03-26 HISTORY — DX: Headache, unspecified: R51.9

## 2022-03-26 LAB — COMPREHENSIVE METABOLIC PANEL
ALT: 20 U/L (ref 0–44)
AST: 21 U/L (ref 15–41)
Albumin: 4 g/dL (ref 3.5–5.0)
Alkaline Phosphatase: 66 U/L (ref 38–126)
Anion gap: 15 (ref 5–15)
BUN: 19 mg/dL (ref 6–20)
CO2: 17 mmol/L — ABNORMAL LOW (ref 22–32)
Calcium: 9.2 mg/dL (ref 8.9–10.3)
Chloride: 104 mmol/L (ref 98–111)
Creatinine, Ser: 0.82 mg/dL (ref 0.61–1.24)
GFR, Estimated: >60 mL/min (ref >60)
Glucose, Bld: 218 mg/dL — ABNORMAL HIGH (ref 70–99)
Potassium: 4.3 mmol/L (ref 3.5–5.1)
Sodium: 136 mmol/L (ref 135–145)
Total Bilirubin: 1 mg/dL (ref 0.3–1.2)
Total Protein: 6.5 g/dL (ref 6.5–8.1)

## 2022-03-26 LAB — CBC
HCT: 42.2 % (ref 39.0–52.0)
Hemoglobin: 14.9 g/dL (ref 13.0–17.0)
MCH: 31.8 pg (ref 26.0–34.0)
MCHC: 35.3 g/dL (ref 30.0–36.0)
MCV: 90.2 fL (ref 80.0–100.0)
Platelets: 194 10*3/uL (ref 150–400)
RBC: 4.68 MIL/uL (ref 4.22–5.81)
RDW: 12.4 % (ref 11.5–15.5)
WBC: 8.2 10*3/uL (ref 4.0–10.5)
nRBC: 0 % (ref 0.0–0.2)

## 2022-03-26 LAB — URINALYSIS, ROUTINE W REFLEX MICROSCOPIC
Bacteria, UA: NONE SEEN
Bilirubin Urine: NEGATIVE
Glucose, UA: >=500 mg/dL — AB
Hgb urine dipstick: NEGATIVE
Ketones, ur: NEGATIVE mg/dL
Leukocytes,Ua: NEGATIVE
Nitrite: NEGATIVE
Protein, ur: NEGATIVE mg/dL
Specific Gravity, Urine: 1.033 — ABNORMAL HIGH (ref 1.005–1.030)
pH: 5 (ref 5.0–8.0)

## 2022-03-26 LAB — HEMOGLOBIN A1C
Hgb A1c MFr Bld: 8.6 % — ABNORMAL HIGH (ref 4.8–5.6)
Mean Plasma Glucose: 200 mg/dL

## 2022-03-26 LAB — SURGICAL PCR SCREEN
MRSA, PCR: NEGATIVE
Staphylococcus aureus: POSITIVE — AB

## 2022-03-26 LAB — GLUCOSE, CAPILLARY: Glucose-Capillary: 240 mg/dL — ABNORMAL HIGH (ref 70–99)

## 2022-03-26 LAB — PROTIME-INR
INR: 1 (ref 0.8–1.2)
Prothrombin Time: 13 seconds (ref 11.4–15.2)

## 2022-03-26 LAB — APTT: aPTT: 27 seconds (ref 24–36)

## 2022-03-26 NOTE — Progress Notes (Addendum)
PCP - Reuben Likes, NP- [facility] Cardiologist - Dr Percival Spanish  PPM/ICD - denies  Chest x-ray - 03/26/2022  EKG - 03/26/2022  Stress Test - 2012 ECHO - 02/27/22 Cardiac Cath - 02/27/22 Chest CT- scheduled for 03/26/2022   Sleep Study - denies   DM- Pt takes his Blood sugar daily. Pt reports he ran out of strips and has not checked his CBG in the last 3 days. Pt will pick up [facility] continuous glucose monitor today as well as test strips. Pt educated that he can wear his CGM on DOS, but it will be up to the anesthesia team as to whether he can leave this on during surgery. Pt given number to call for replacement CGM supplies if he ends up wearing and taking off on DOS [phone]). Pt states he may not put this on yet, but will check his blood sugar using his meter if not. Pt reports his [place] Aas was recently increased to 44 units daily from 40 units daily. Pt states he takes his [place] in the AM.   Hgb A1c- 8.2 on 02/19/22. Drawn today per MD order.  CBG 240 at PAT appointment. Pt ate 2 breakfast burritos and diet coke at 8AM. Pt states he has not taken his Tyler Aas this morning, but did take his [place] and Metformin. Pt reports his blood sugar is normally 140-180 at home.   Last dose of GLP1 agonist-  N/A   Blood Thinner Instructions: Per Surgeon's letter:  **STOP PLAVIX 1 WEEK PRIOR TO SURGERY** **STOP FARXIGA 3 DAYS PRIOR TO SURGERY** **STOP METFORMIN 2 DAYS PRIOR TO SURGERY**  ERAS Protcol -NPO per order   COVID TEST- DOS   Anesthesia review: Cardiac history, Hgb A1c elevated. I spoke with Shelby Dubin, APP to notify of patient during pre-op appointment. Pt had questions about whether he can continue taking his Vascepa prior to surgery, and whether Ranexa can be taken DOS. Pt also recently had A1c drawn, but surgeon ordered Hgb A1c again. Dr Binnie Kand nurse Barnabas Harries, RN notified of questions. Per Barnabas Harries, RN we can re-do A1c today, and she will reach out to patient  regarding medication.   Per MD orders labs to be done within 7 days of surgery. Pt was scheduled for PAT appointment today (over 7 days) d/t patient having scheduled dopplers and Chest CT and pt living 2.5 hours away. OK to draw labs today for surgery per Barnabas Harries, RN. COVID test will need to be done Day of surgery. Pt verbalized understanding.   Patient denies shortness of breath, fever, cough and chest pain at PAT appointment   All instructions explained to the patient, with a verbal understanding of the material. Patient agrees to go over the instructions while at home for a better understanding. The opportunity to ask questions was provided.

## 2022-03-26 NOTE — Progress Notes (Signed)
Received a call from lab that ABG cannot be run because there are air bubbles in the syringe.  I have reordered for day of surgery.

## 2022-04-03 MED ORDER — POTASSIUM CHLORIDE 2 MEQ/ML IV SOLN
80.0000 meq | INTRAVENOUS | Status: DC
  Filled 2022-04-03: qty 40

## 2022-04-03 MED ORDER — VANCOMYCIN HCL 1000 MG IV SOLR
INTRAVENOUS | Status: DC
  Filled 2022-04-03: qty 20

## 2022-04-03 MED ORDER — PHENYLEPHRINE HCL-NACL 20-0.9 MG/250ML-% IV SOLN
30.0000 ug/min | INTRAVENOUS | Status: AC
  Administered 2022-04-04: 25 ug/min via INTRAVENOUS
  Filled 2022-04-03: qty 250

## 2022-04-03 MED ORDER — MILRINONE LACTATE IN DEXTROSE 20-5 MG/100ML-% IV SOLN
0.3000 ug/kg/min | INTRAVENOUS | Status: DC
  Filled 2022-04-03: qty 100

## 2022-04-03 MED ORDER — VANCOMYCIN HCL 1250 MG/250ML IV SOLN
1250.0000 mg | INTRAVENOUS | Status: AC
  Administered 2022-04-04: 1250 mg via INTRAVENOUS
  Filled 2022-04-03: qty 250

## 2022-04-03 MED ORDER — MANNITOL 20 % IV SOLN
INTRAVENOUS | Status: DC
  Filled 2022-04-03: qty 13

## 2022-04-03 MED ORDER — CEFAZOLIN SODIUM-DEXTROSE 2-4 GM/100ML-% IV SOLN
2.0000 g | INTRAVENOUS | Status: AC
  Administered 2022-04-04: 2 g via INTRAVENOUS
  Filled 2022-04-03: qty 100

## 2022-04-03 MED ORDER — PLASMA-LYTE A IV SOLN
INTRAVENOUS | Status: DC
  Filled 2022-04-03: qty 2.5

## 2022-04-03 MED ORDER — HEPARIN 30,000 UNITS/1000 ML (OHS) CELLSAVER SOLUTION
Status: DC
  Filled 2022-04-03: qty 1000

## 2022-04-03 MED ORDER — TRANEXAMIC ACID (OHS) PUMP PRIME SOLUTION
2.0000 mg/kg | INTRAVENOUS | Status: DC
  Filled 2022-04-03: qty 1.53

## 2022-04-03 MED ORDER — DEXMEDETOMIDINE HCL IN NACL 400 MCG/100ML IV SOLN
0.1000 ug/kg/h | INTRAVENOUS | Status: AC
  Administered 2022-04-04: .4 ug/kg/h via INTRAVENOUS
  Filled 2022-04-03: qty 100

## 2022-04-03 MED ORDER — INSULIN REGULAR(HUMAN) IN NACL 100-0.9 UT/100ML-% IV SOLN
INTRAVENOUS | Status: AC
  Administered 2022-04-04: 4.2 [IU]/h via INTRAVENOUS
  Filled 2022-04-03: qty 100

## 2022-04-03 MED ORDER — TRANEXAMIC ACID 1000 MG/10ML IV SOLN
1.5000 mg/kg/h | INTRAVENOUS | Status: AC
  Administered 2022-04-04: 1.5 mg/kg/h via INTRAVENOUS
  Filled 2022-04-03: qty 25

## 2022-04-03 MED ORDER — TRANEXAMIC ACID (OHS) BOLUS VIA INFUSION
15.0000 mg/kg | INTRAVENOUS | Status: AC
  Administered 2022-04-04: 1149 mg via INTRAVENOUS
  Filled 2022-04-03: qty 1149

## 2022-04-03 MED ORDER — NOREPINEPHRINE 4 MG/250ML-% IV SOLN
0.0000 ug/min | INTRAVENOUS | Status: DC
  Filled 2022-04-03: qty 250

## 2022-04-03 MED ORDER — EPINEPHRINE HCL 5 MG/250ML IV SOLN IN NS
0.0000 ug/min | INTRAVENOUS | Status: DC
  Filled 2022-04-03: qty 250

## 2022-04-03 NOTE — Anesthesia Preprocedure Evaluation (Addendum)
Anesthesia Evaluation  Patient identified by MRN, date of birth, ID band Patient awake    Reviewed: Allergy & Precautions, NPO status , Patient's Chart, lab work & pertinent test results  Airway Mallampati: II  TM Distance: >3 FB Neck ROM: Full    Dental no notable dental hx.    Pulmonary    Pulmonary exam normal        Cardiovascular hypertension, Pt. on medications + angina  + CAD and + Cardiac Stents   Rhythm:Regular Rate:Normal  ECHO 12/23:  1. Left ventricular ejection fraction, by estimation, is 60 to 65%. The  left ventricle has normal function. The left ventricle has no regional  wall motion abnormalities. Left ventricular diastolic parameters are  indeterminate.   2. Right ventricular systolic function is normal. The right ventricular  size is normal. Tricuspid regurgitation signal is inadequate for assessing  PA pressure.   3. The mitral valve is normal in structure. No evidence of mitral valve  regurgitation. No evidence of mitral stenosis.   4. The aortic valve is normal in structure. Aortic valve regurgitation is  not visualized. No aortic stenosis is present.   5. The inferior vena cava is normal in size with greater than 50%  respiratory variability, suggesting right atrial pressure of 3 mmHg.    Cath 12/23: Severe three vessel CAD Severe disease in the proximal and mid LAD. Severe disease moderate caliber diagona Severe disease obtuse marginal 1. Chronic total occlusion of the stented segment of obtuse marginal 2 with collateral filling of this branch Large dominant RCA with prior stenting of the proximal, mid and distal RCA. Moderately severe restenosis proximal and mid RCA stented segment. Severe restenosis distal RCA stented segment. Severe stenosis proximal segment of the moderate caliber posterolateral artery. Severe disease proximal segment of moderate caliber PDA Normal LV systolic function      Neuro/Psych  Headaches   Depression       GI/Hepatic Neg liver ROS,GERD  ,,  Endo/Other  diabetes, Type 2, Oral Hypoglycemic Agents    Renal/GU negative Renal ROS     Musculoskeletal negative musculoskeletal ROS (+)    Abdominal Normal abdominal exam  (+)   Peds  Hematology   Anesthesia Other Findings   Reproductive/Obstetrics                             Anesthesia Physical Anesthesia Plan  ASA: 4  Anesthesia Plan: General   Post-op Pain Management:    Induction: Intravenous  PONV Risk Score and Plan: 2 and Midazolam and Treatment may vary due to age or medical condition  Airway Management Planned: Mask and Oral ETT  Additional Equipment: Arterial line, CVP, PA Cath, TEE, 3D TEE and Ultrasound Guidance Line Placement  Intra-op Plan:   Post-operative Plan: Post-operative intubation/ventilation  Informed Consent: I have reviewed the patients History and Physical, chart, labs and discussed the procedure including the risks, benefits and alternatives for the proposed anesthesia with the patient or authorized representative who has indicated his/her understanding and acceptance.     Dental advisory given  Plan Discussed with: CRNA  Anesthesia Plan Comments: (Lab Results      Component                Value               Date  WBC                      8.2                 03/26/2022                HGB                      14.9                03/26/2022                HCT                      42.2                03/26/2022                MCV                      90.2                03/26/2022                PLT                      194                 03/26/2022             Lab Results      Component                Value               Date                      NA                       136                 03/26/2022                K                        4.3                 03/26/2022                CO2                       17 (L)              03/26/2022                GLUCOSE                  218 (H)             03/26/2022                BUN                      19                  03/26/2022  CREATININE               0.82                03/26/2022                CALCIUM                  9.2                 03/26/2022                EGFR                     97                  02/19/2022                GFRNONAA                 >60                 03/26/2022           )       Anesthesia Quick Evaluation

## 2022-04-04 ENCOUNTER — Inpatient Hospital Stay (HOSPITAL_COMMUNITY)
Admission: RE | Disposition: A | Discharge status: Home / Self Care | Payer: Self-pay | Source: Ambulatory Visit | Attending: Cardiothoracic Surgery

## 2022-04-04 ENCOUNTER — Inpatient Hospital Stay (HOSPITAL_COMMUNITY): Payer: Managed Care, Other (non HMO)

## 2022-04-04 ENCOUNTER — Inpatient Hospital Stay (HOSPITAL_COMMUNITY)
Admission: RE | Admit: 2022-04-04 | Discharge: 2022-04-09 | DRG: 236 | Disposition: A | Discharge status: Home / Self Care | Payer: Managed Care, Other (non HMO) | Source: Ambulatory Visit | Attending: Cardiothoracic Surgery | Admitting: Cardiothoracic Surgery

## 2022-04-04 ENCOUNTER — Other Ambulatory Visit: Payer: Self-pay

## 2022-04-04 ENCOUNTER — Inpatient Hospital Stay (HOSPITAL_COMMUNITY): Payer: Managed Care, Other (non HMO) | Admitting: Physician Assistant

## 2022-04-04 ENCOUNTER — Inpatient Hospital Stay (HOSPITAL_COMMUNITY): Payer: Managed Care, Other (non HMO) | Admitting: Vascular Surgery

## 2022-04-04 ENCOUNTER — Encounter (HOSPITAL_COMMUNITY): Payer: Self-pay | Admitting: Cardiothoracic Surgery

## 2022-04-04 DIAGNOSIS — D696 Thrombocytopenia, unspecified: Secondary | ICD-10-CM | POA: Diagnosis present

## 2022-04-04 DIAGNOSIS — Z833 Family history of diabetes mellitus: Secondary | ICD-10-CM | POA: Diagnosis not present

## 2022-04-04 DIAGNOSIS — Z951 Presence of aortocoronary bypass graft: Secondary | ICD-10-CM | POA: Diagnosis present

## 2022-04-04 DIAGNOSIS — J9811 Atelectasis: Secondary | ICD-10-CM | POA: Diagnosis present

## 2022-04-04 DIAGNOSIS — I10 Essential (primary) hypertension: Secondary | ICD-10-CM | POA: Diagnosis present

## 2022-04-04 DIAGNOSIS — E119 Type 2 diabetes mellitus without complications: Secondary | ICD-10-CM | POA: Diagnosis not present

## 2022-04-04 DIAGNOSIS — Z8249 Family history of ischemic heart disease and other diseases of the circulatory system: Secondary | ICD-10-CM

## 2022-04-04 DIAGNOSIS — I25110 Atherosclerotic heart disease of native coronary artery with unstable angina pectoris: Principal | ICD-10-CM | POA: Diagnosis present

## 2022-04-04 DIAGNOSIS — Z7984 Long term (current) use of oral hypoglycemic drugs: Secondary | ICD-10-CM | POA: Diagnosis not present

## 2022-04-04 DIAGNOSIS — Z7982 Long term (current) use of aspirin: Secondary | ICD-10-CM

## 2022-04-04 DIAGNOSIS — Z01818 Encounter for other preprocedural examination: Secondary | ICD-10-CM

## 2022-04-04 DIAGNOSIS — E11649 Type 2 diabetes mellitus with hypoglycemia without coma: Secondary | ICD-10-CM | POA: Diagnosis present

## 2022-04-04 DIAGNOSIS — I9581 Postprocedural hypotension: Secondary | ICD-10-CM | POA: Diagnosis not present

## 2022-04-04 DIAGNOSIS — J9601 Acute respiratory failure with hypoxia: Secondary | ICD-10-CM

## 2022-04-04 DIAGNOSIS — I25119 Atherosclerotic heart disease of native coronary artery with unspecified angina pectoris: Secondary | ICD-10-CM

## 2022-04-04 DIAGNOSIS — Z880 Allergy status to penicillin: Secondary | ICD-10-CM

## 2022-04-04 DIAGNOSIS — Z955 Presence of coronary angioplasty implant and graft: Secondary | ICD-10-CM

## 2022-04-04 DIAGNOSIS — E1165 Type 2 diabetes mellitus with hyperglycemia: Secondary | ICD-10-CM | POA: Diagnosis present

## 2022-04-04 DIAGNOSIS — Z7902 Long term (current) use of antithrombotics/antiplatelets: Secondary | ICD-10-CM | POA: Diagnosis not present

## 2022-04-04 DIAGNOSIS — K219 Gastro-esophageal reflux disease without esophagitis: Secondary | ICD-10-CM | POA: Diagnosis present

## 2022-04-04 DIAGNOSIS — Z888 Allergy status to other drugs, medicaments and biological substances status: Secondary | ICD-10-CM | POA: Diagnosis not present

## 2022-04-04 DIAGNOSIS — E781 Pure hyperglyceridemia: Secondary | ICD-10-CM | POA: Diagnosis present

## 2022-04-04 DIAGNOSIS — D689 Coagulation defect, unspecified: Secondary | ICD-10-CM

## 2022-04-04 DIAGNOSIS — E785 Hyperlipidemia, unspecified: Secondary | ICD-10-CM

## 2022-04-04 DIAGNOSIS — I2510 Atherosclerotic heart disease of native coronary artery without angina pectoris: Secondary | ICD-10-CM | POA: Diagnosis not present

## 2022-04-04 DIAGNOSIS — Z794 Long term (current) use of insulin: Secondary | ICD-10-CM | POA: Diagnosis not present

## 2022-04-04 DIAGNOSIS — Z1152 Encounter for screening for COVID-19: Secondary | ICD-10-CM | POA: Diagnosis not present

## 2022-04-04 DIAGNOSIS — R079 Chest pain, unspecified: Secondary | ICD-10-CM

## 2022-04-04 HISTORY — PX: CORONARY ARTERY BYPASS GRAFT: SHX141

## 2022-04-04 HISTORY — PX: TEE WITHOUT CARDIOVERSION: SHX5443

## 2022-04-04 LAB — POCT I-STAT 7, (LYTES, BLD GAS, ICA,H+H)
Acid-Base Excess: 1 mmol/L (ref 0.0–2.0)
Acid-base deficit: 1 mmol/L (ref 0.0–2.0)
Acid-base deficit: 1 mmol/L (ref 0.0–2.0)
Acid-base deficit: 3 mmol/L — ABNORMAL HIGH (ref 0.0–2.0)
Acid-base deficit: 4 mmol/L — ABNORMAL HIGH (ref 0.0–2.0)
Bicarbonate: 24.3 mmol/L (ref 20.0–28.0)
Bicarbonate: 23.3 mmol/L (ref 20.0–28.0)
Bicarbonate: 26.3 mmol/L (ref 20.0–28.0)
Bicarbonate: 23.3 mmol/L (ref 20.0–28.0)
Bicarbonate: 23.7 mmol/L (ref 20.0–28.0)
Calcium, Ion: 1.02 mmol/L — ABNORMAL LOW (ref 1.15–1.40)
Calcium, Ion: 1.25 mmol/L (ref 1.15–1.40)
Calcium, Ion: 1.15 mmol/L (ref 1.15–1.40)
Calcium, Ion: 1.13 mmol/L — ABNORMAL LOW (ref 1.15–1.40)
Calcium, Ion: 1.17 mmol/L (ref 1.15–1.40)
HCT: 25 % — ABNORMAL LOW (ref 39.0–52.0)
HCT: 22 % — ABNORMAL LOW (ref 39.0–52.0)
HCT: 21 % — ABNORMAL LOW (ref 39.0–52.0)
HCT: 17 % — ABNORMAL LOW (ref 39.0–52.0)
HCT: 18 % — ABNORMAL LOW (ref 39.0–52.0)
Hemoglobin: 8.5 g/dL — ABNORMAL LOW (ref 13.0–17.0)
Hemoglobin: 7.5 g/dL — ABNORMAL LOW (ref 13.0–17.0)
Hemoglobin: 7.1 g/dL — ABNORMAL LOW (ref 13.0–17.0)
Hemoglobin: 5.8 g/dL — CL (ref 13.0–17.0)
Hemoglobin: 6.1 g/dL — CL (ref 13.0–17.0)
O2 Saturation: 100 %
O2 Saturation: 100 %
O2 Saturation: 100 %
O2 Saturation: 99 %
O2 Saturation: 99 %
Patient temperature: 36.4
Patient temperature: 37
Patient temperature: 37.1
Potassium: 4.6 mmol/L (ref 3.5–5.1)
Potassium: 3.7 mmol/L (ref 3.5–5.1)
Potassium: 3.7 mmol/L (ref 3.5–5.1)
Potassium: 4.5 mmol/L (ref 3.5–5.1)
Potassium: 4.6 mmol/L (ref 3.5–5.1)
Sodium: 142 mmol/L (ref 135–145)
Sodium: 139 mmol/L (ref 135–145)
Sodium: 142 mmol/L (ref 135–145)
Sodium: 142 mmol/L (ref 135–145)
Sodium: 142 mmol/L (ref 135–145)
TCO2: 26 mmol/L (ref 22–32)
TCO2: 24 mmol/L (ref 22–32)
TCO2: 28 mmol/L (ref 22–32)
TCO2: 25 mmol/L (ref 22–32)
TCO2: 25 mmol/L (ref 22–32)
pCO2 arterial: 41.7 mmHg (ref 32–48)
pCO2 arterial: 36.8 mmHg (ref 32–48)
pCO2 arterial: 45.5 mmHg (ref 32–48)
pCO2 arterial: 52.1 mmHg — ABNORMAL HIGH (ref 32–48)
pCO2 arterial: 54 mmHg — ABNORMAL HIGH (ref 32–48)
pH, Arterial: 7.374 (ref 7.35–7.45)
pH, Arterial: 7.41 (ref 7.35–7.45)
pH, Arterial: 7.367 (ref 7.35–7.45)
pH, Arterial: 7.244 — ABNORMAL LOW (ref 7.35–7.45)
pH, Arterial: 7.266 — ABNORMAL LOW (ref 7.35–7.45)
pO2, Arterial: 315 mmHg — ABNORMAL HIGH (ref 83–108)
pO2, Arterial: 339 mmHg — ABNORMAL HIGH (ref 83–108)
pO2, Arterial: 200 mmHg — ABNORMAL HIGH (ref 83–108)
pO2, Arterial: 143 mmHg — ABNORMAL HIGH (ref 83–108)
pO2, Arterial: 148 mmHg — ABNORMAL HIGH (ref 83–108)

## 2022-04-04 LAB — POCT I-STAT, CHEM 8
BUN: 16 mg/dL (ref 6–20)
BUN: 15 mg/dL (ref 6–20)
BUN: 13 mg/dL (ref 6–20)
BUN: 14 mg/dL (ref 6–20)
BUN: 12 mg/dL (ref 6–20)
BUN: 13 mg/dL (ref 6–20)
BUN: 13 mg/dL (ref 6–20)
Calcium, Ion: 1.29 mmol/L (ref 1.15–1.40)
Calcium, Ion: 1.26 mmol/L (ref 1.15–1.40)
Calcium, Ion: 1.04 mmol/L — ABNORMAL LOW (ref 1.15–1.40)
Calcium, Ion: 1.06 mmol/L — ABNORMAL LOW (ref 1.15–1.40)
Calcium, Ion: 0.91 mmol/L — ABNORMAL LOW (ref 1.15–1.40)
Calcium, Ion: 1.01 mmol/L — ABNORMAL LOW (ref 1.15–1.40)
Calcium, Ion: 1.25 mmol/L (ref 1.15–1.40)
Chloride: 103 mmol/L (ref 98–111)
Chloride: 104 mmol/L (ref 98–111)
Chloride: 100 mmol/L (ref 98–111)
Chloride: 100 mmol/L (ref 98–111)
Chloride: 102 mmol/L (ref 98–111)
Chloride: 99 mmol/L (ref 98–111)
Chloride: 102 mmol/L (ref 98–111)
Creatinine, Ser: 0.6 mg/dL — ABNORMAL LOW (ref 0.61–1.24)
Creatinine, Ser: 0.7 mg/dL (ref 0.61–1.24)
Creatinine, Ser: 0.5 mg/dL — ABNORMAL LOW (ref 0.61–1.24)
Creatinine, Ser: 0.6 mg/dL — ABNORMAL LOW (ref 0.61–1.24)
Creatinine, Ser: 0.5 mg/dL — ABNORMAL LOW (ref 0.61–1.24)
Creatinine, Ser: 0.6 mg/dL — ABNORMAL LOW (ref 0.61–1.24)
Creatinine, Ser: 0.6 mg/dL — ABNORMAL LOW (ref 0.61–1.24)
Glucose, Bld: 158 mg/dL — ABNORMAL HIGH (ref 70–99)
Glucose, Bld: 82 mg/dL (ref 70–99)
Glucose, Bld: 120 mg/dL — ABNORMAL HIGH (ref 70–99)
Glucose, Bld: 70 mg/dL (ref 70–99)
Glucose, Bld: 126 mg/dL — ABNORMAL HIGH (ref 70–99)
Glucose, Bld: 130 mg/dL — ABNORMAL HIGH (ref 70–99)
Glucose, Bld: 146 mg/dL — ABNORMAL HIGH (ref 70–99)
HCT: 37 % — ABNORMAL LOW (ref 39.0–52.0)
HCT: 33 % — ABNORMAL LOW (ref 39.0–52.0)
HCT: 23 % — ABNORMAL LOW (ref 39.0–52.0)
HCT: 24 % — ABNORMAL LOW (ref 39.0–52.0)
HCT: 20 % — ABNORMAL LOW (ref 39.0–52.0)
HCT: 23 % — ABNORMAL LOW (ref 39.0–52.0)
HCT: 23 % — ABNORMAL LOW (ref 39.0–52.0)
Hemoglobin: 12.6 g/dL — ABNORMAL LOW (ref 13.0–17.0)
Hemoglobin: 11.2 g/dL — ABNORMAL LOW (ref 13.0–17.0)
Hemoglobin: 7.8 g/dL — ABNORMAL LOW (ref 13.0–17.0)
Hemoglobin: 8.2 g/dL — ABNORMAL LOW (ref 13.0–17.0)
Hemoglobin: 6.8 g/dL — CL (ref 13.0–17.0)
Hemoglobin: 7.8 g/dL — ABNORMAL LOW (ref 13.0–17.0)
Hemoglobin: 7.8 g/dL — ABNORMAL LOW (ref 13.0–17.0)
Potassium: 4.4 mmol/L (ref 3.5–5.1)
Potassium: 4.4 mmol/L (ref 3.5–5.1)
Potassium: 4.4 mmol/L (ref 3.5–5.1)
Potassium: 4.8 mmol/L (ref 3.5–5.1)
Potassium: 4.4 mmol/L (ref 3.5–5.1)
Potassium: 4.5 mmol/L (ref 3.5–5.1)
Potassium: 3.7 mmol/L (ref 3.5–5.1)
Sodium: 141 mmol/L (ref 135–145)
Sodium: 140 mmol/L (ref 135–145)
Sodium: 136 mmol/L (ref 135–145)
Sodium: 138 mmol/L (ref 135–145)
Sodium: 134 mmol/L — ABNORMAL LOW (ref 135–145)
Sodium: 137 mmol/L (ref 135–145)
Sodium: 139 mmol/L (ref 135–145)
TCO2: 28 mmol/L (ref 22–32)
TCO2: 25 mmol/L (ref 22–32)
TCO2: 27 mmol/L (ref 22–32)
TCO2: 28 mmol/L (ref 22–32)
TCO2: 26 mmol/L (ref 22–32)
TCO2: 27 mmol/L (ref 22–32)
TCO2: 25 mmol/L (ref 22–32)

## 2022-04-04 LAB — BLOOD GAS, ARTERIAL
Acid-Base Excess: 1.9 mmol/L (ref 0.0–2.0)
Bicarbonate: 29 mmol/L — ABNORMAL HIGH (ref 20.0–28.0)
O2 Saturation: 99.6 %
Patient temperature: 37
pCO2 arterial: 55 mmHg — ABNORMAL HIGH (ref 32–48)
pH, Arterial: 7.33 — ABNORMAL LOW (ref 7.35–7.45)
pO2, Arterial: 216 mmHg — ABNORMAL HIGH (ref 83–108)

## 2022-04-04 LAB — GLUCOSE, CAPILLARY
Glucose-Capillary: 216 mg/dL — ABNORMAL HIGH (ref 70–99)
Glucose-Capillary: 105 mg/dL — ABNORMAL HIGH (ref 70–99)
Glucose-Capillary: 106 mg/dL — ABNORMAL HIGH (ref 70–99)
Glucose-Capillary: 91 mg/dL (ref 70–99)
Glucose-Capillary: 126 mg/dL — ABNORMAL HIGH (ref 70–99)
Glucose-Capillary: 133 mg/dL — ABNORMAL HIGH (ref 70–99)
Glucose-Capillary: 131 mg/dL — ABNORMAL HIGH (ref 70–99)
Glucose-Capillary: 102 mg/dL — ABNORMAL HIGH (ref 70–99)
Glucose-Capillary: 67 mg/dL — ABNORMAL LOW (ref 70–99)
Glucose-Capillary: 73 mg/dL (ref 70–99)

## 2022-04-04 LAB — POCT I-STAT EG7
Acid-base deficit: 1 mmol/L (ref 0.0–2.0)
Bicarbonate: 25 mmol/L (ref 20.0–28.0)
Calcium, Ion: 1.1 mmol/L — ABNORMAL LOW (ref 1.15–1.40)
HCT: 26 % — ABNORMAL LOW (ref 39.0–52.0)
Hemoglobin: 8.8 g/dL — ABNORMAL LOW (ref 13.0–17.0)
O2 Saturation: 69 %
Potassium: 3.8 mmol/L (ref 3.5–5.1)
Sodium: 143 mmol/L (ref 135–145)
TCO2: 26 mmol/L (ref 22–32)
pCO2, Ven: 46.8 mmHg (ref 44–60)
pH, Ven: 7.335 (ref 7.25–7.43)
pO2, Ven: 39 mmHg (ref 32–45)

## 2022-04-04 LAB — PROTIME-INR
INR: 1.5 — ABNORMAL HIGH (ref 0.8–1.2)
Prothrombin Time: 17.7 seconds — ABNORMAL HIGH (ref 11.4–15.2)

## 2022-04-04 LAB — ECHO INTRAOPERATIVE TEE
AR max vel: 2.06 cm2
AV Area VTI: 2.11 cm2
AV Area mean vel: 2.17 cm2
AV Mean grad: 2 mmHg
AV Peak grad: 4 mmHg
Ao pk vel: 1 m/s
Area-P 1/2: 2.87 cm2
Height: 66 in
S' Lateral: 2.3 cm
Weight: 2560 oz

## 2022-04-04 LAB — APTT: aPTT: 32 seconds (ref 24–36)

## 2022-04-04 LAB — CBC
HCT: 21.2 % — ABNORMAL LOW (ref 39.0–52.0)
Hemoglobin: 7.4 g/dL — ABNORMAL LOW (ref 13.0–17.0)
MCH: 31.4 pg (ref 26.0–34.0)
MCHC: 34.9 g/dL (ref 30.0–36.0)
MCV: 89.8 fL (ref 80.0–100.0)
Platelets: 102 10*3/uL — ABNORMAL LOW (ref 150–400)
RBC: 2.36 MIL/uL — ABNORMAL LOW (ref 4.22–5.81)
RDW: 13.7 % (ref 11.5–15.5)
WBC: 9.6 10*3/uL (ref 4.0–10.5)
nRBC: 0 % (ref 0.0–0.2)

## 2022-04-04 LAB — HEMOGLOBIN AND HEMATOCRIT, BLOOD
HCT: 22.8 % — ABNORMAL LOW (ref 39.0–52.0)
Hemoglobin: 8 g/dL — ABNORMAL LOW (ref 13.0–17.0)

## 2022-04-04 LAB — PREPARE RBC (CROSSMATCH)

## 2022-04-04 LAB — SARS CORONAVIRUS 2 BY RT PCR: SARS Coronavirus 2 by RT PCR: NEGATIVE

## 2022-04-04 LAB — FIBRINOGEN: Fibrinogen: 165 mg/dL — ABNORMAL LOW (ref 210–475)

## 2022-04-04 LAB — PLATELET COUNT: Platelets: 122 10*3/uL — ABNORMAL LOW (ref 150–400)

## 2022-04-04 LAB — ABO/RH: ABO/RH(D): O POS

## 2022-04-04 SURGERY — CORONARY ARTERY BYPASS GRAFTING (CABG)
Anesthesia: General | Site: Chest

## 2022-04-04 MED ORDER — SODIUM CHLORIDE 0.9% IV SOLUTION: Freq: Once | INTRAVENOUS | Status: DC

## 2022-04-04 MED ORDER — ROCURONIUM BROMIDE 10 MG/ML (PF) SYRINGE
PREFILLED_SYRINGE | INTRAVENOUS | Status: AC
  Filled 2022-04-04: qty 20

## 2022-04-04 MED ORDER — ROCURONIUM BROMIDE 10 MG/ML (PF) SYRINGE
PREFILLED_SYRINGE | INTRAVENOUS | Status: DC | PRN
  Administered 2022-04-04: 70 mg via INTRAVENOUS
  Administered 2022-04-04 (×2): 50 mg via INTRAVENOUS
  Administered 2022-04-04 (×2): 30 mg via INTRAVENOUS

## 2022-04-04 MED ORDER — HEPARIN SODIUM (PORCINE) 1000 UNIT/ML IJ SOLN
INTRAMUSCULAR | Status: DC | PRN
  Administered 2022-04-04: 5000 [IU] via INTRAVENOUS
  Administered 2022-04-04: 21000 [IU] via INTRAVENOUS

## 2022-04-04 MED ORDER — METOCLOPRAMIDE HCL 5 MG/ML IJ SOLN
10.0000 mg | Freq: Four times a day (QID) | INTRAMUSCULAR | Status: AC
  Administered 2022-04-04 – 2022-04-05 (×2): 10 mg via INTRAVENOUS
  Filled 2022-04-04 (×3): qty 2

## 2022-04-04 MED ORDER — ESCITALOPRAM OXALATE 10 MG PO TABS
10.0000 mg | ORAL_TABLET | Freq: Every day | ORAL | Status: DC
  Administered 2022-04-05 – 2022-04-09 (×5): 10 mg via ORAL
  Filled 2022-04-04 (×5): qty 1

## 2022-04-04 MED ORDER — FENTANYL CITRATE (PF) 250 MCG/5ML IJ SOLN
INTRAMUSCULAR | Status: AC
  Filled 2022-04-04: qty 5

## 2022-04-04 MED ORDER — HEMOSTATIC AGENTS (NO CHARGE) OPTIME
TOPICAL | Status: DC | PRN
  Administered 2022-04-04 (×3): 1 via TOPICAL

## 2022-04-04 MED ORDER — PROTAMINE SULFATE 10 MG/ML IV SOLN
INTRAVENOUS | Status: DC | PRN
  Administered 2022-04-04: 260 mg via INTRAVENOUS

## 2022-04-04 MED ORDER — ACETAMINOPHEN 500 MG PO TABS
1000.0000 mg | ORAL_TABLET | Freq: Four times a day (QID) | ORAL | Status: DC
  Administered 2022-04-05 – 2022-04-09 (×16): 1000 mg via ORAL
  Filled 2022-04-04 (×16): qty 2

## 2022-04-04 MED ORDER — SODIUM CHLORIDE 0.9% FLUSH: 3.0000 mL | INTRAVENOUS | Status: DC | PRN

## 2022-04-04 MED ORDER — PROPOFOL 10 MG/ML IV BOLUS
INTRAVENOUS | Status: DC | PRN
  Administered 2022-04-04: 30 mg via INTRAVENOUS
  Administered 2022-04-04: 120 mg via INTRAVENOUS
  Administered 2022-04-04: 30 mg via INTRAVENOUS

## 2022-04-04 MED ORDER — INSULIN REGULAR(HUMAN) IN NACL 100-0.9 UT/100ML-% IV SOLN: INTRAVENOUS | Status: DC

## 2022-04-04 MED ORDER — LACTATED RINGERS IV SOLN: 500.0000 mL | Freq: Once | INTRAVENOUS | Status: DC | PRN

## 2022-04-04 MED ORDER — METOPROLOL TARTRATE 12.5 MG HALF TABLET: 12.5000 mg | ORAL_TABLET | Freq: Once | ORAL | Status: DC

## 2022-04-04 MED ORDER — ATORVASTATIN CALCIUM 80 MG PO TABS
80.0000 mg | ORAL_TABLET | Freq: Every day | ORAL | Status: DC
  Administered 2022-04-05 – 2022-04-09 (×5): 80 mg via ORAL
  Filled 2022-04-04 (×5): qty 1

## 2022-04-04 MED ORDER — MORPHINE SULFATE (PF) 2 MG/ML IV SOLN
1.0000 mg | INTRAVENOUS | Status: DC | PRN
  Administered 2022-04-04 (×2): 2 mg via INTRAVENOUS
  Administered 2022-04-05 (×2): 4 mg via INTRAVENOUS
  Administered 2022-04-05: 2 mg via INTRAVENOUS
  Administered 2022-04-05: 4 mg via INTRAVENOUS
  Filled 2022-04-04 (×5): qty 1
  Filled 2022-04-04 (×3): qty 2

## 2022-04-04 MED ORDER — CHLORHEXIDINE GLUCONATE 4 % EX LIQD: 30.0000 mL | CUTANEOUS | Status: DC

## 2022-04-04 MED ORDER — SODIUM CHLORIDE 0.9 % IV SOLN: 250.0000 mL | INTRAVENOUS | Status: DC

## 2022-04-04 MED ORDER — CHLORHEXIDINE GLUCONATE 0.12 % MT SOLN
15.0000 mL | Freq: Once | OROMUCOSAL | Status: AC
  Administered 2022-04-04: 15 mL via OROMUCOSAL
  Filled 2022-04-04: qty 15

## 2022-04-04 MED ORDER — CHLORHEXIDINE GLUCONATE CLOTH 2 % EX PADS
6.0000 | MEDICATED_PAD | Freq: Every day | CUTANEOUS | Status: DC
  Administered 2022-04-05 – 2022-04-06 (×2): 6 via TOPICAL

## 2022-04-04 MED ORDER — CHLORHEXIDINE GLUCONATE 0.12 % MT SOLN
15.0000 mL | OROMUCOSAL | Status: AC
  Administered 2022-04-04: 15 mL via OROMUCOSAL
  Filled 2022-04-04 (×2): qty 15

## 2022-04-04 MED ORDER — TRAMADOL HCL 50 MG PO TABS
50.0000 mg | ORAL_TABLET | ORAL | Status: DC | PRN
  Administered 2022-04-05 (×2): 50 mg via ORAL
  Filled 2022-04-04 (×3): qty 1

## 2022-04-04 MED ORDER — DEXMEDETOMIDINE HCL IN NACL 400 MCG/100ML IV SOLN
0.0000 ug/kg/h | INTRAVENOUS | Status: DC
  Administered 2022-04-04: 0.7 ug/kg/h via INTRAVENOUS
  Filled 2022-04-04: qty 100

## 2022-04-04 MED ORDER — DEXTROSE 50 % IV SOLN
0.0000 mL | INTRAVENOUS | Status: DC | PRN
  Filled 2022-04-04: qty 50

## 2022-04-04 MED ORDER — ~~LOC~~ CARDIAC SURGERY, PATIENT & FAMILY EDUCATION
Freq: Once | Status: DC
  Filled 2022-04-04: qty 1

## 2022-04-04 MED ORDER — DEXTROSE 50 % IV SOLN
INTRAVENOUS | Status: DC | PRN
  Administered 2022-04-04: 6.25 g via INTRAVENOUS

## 2022-04-04 MED ORDER — MIDAZOLAM HCL (PF) 5 MG/ML IJ SOLN
INTRAMUSCULAR | Status: DC | PRN
  Administered 2022-04-04 (×3): 1 mg via INTRAVENOUS
  Administered 2022-04-04: 2 mg via INTRAVENOUS
  Administered 2022-04-04 (×2): 1 mg via INTRAVENOUS
  Administered 2022-04-04: 2 mg via INTRAVENOUS
  Administered 2022-04-04: 1 mg via INTRAVENOUS

## 2022-04-04 MED ORDER — OXYCODONE HCL 5 MG PO TABS
5.0000 mg | ORAL_TABLET | ORAL | Status: DC | PRN
  Administered 2022-04-05 – 2022-04-06 (×9): 10 mg via ORAL
  Administered 2022-04-07 – 2022-04-08 (×3): 5 mg via ORAL
  Filled 2022-04-04: qty 1
  Filled 2022-04-04: qty 2
  Filled 2022-04-04: qty 1
  Filled 2022-04-04 (×3): qty 2
  Filled 2022-04-04 (×2): qty 1
  Filled 2022-04-04 (×5): qty 2

## 2022-04-04 MED ORDER — CEFAZOLIN SODIUM-DEXTROSE 2-4 GM/100ML-% IV SOLN
2.0000 g | Freq: Three times a day (TID) | INTRAVENOUS | Status: AC
  Administered 2022-04-04 – 2022-04-06 (×6): 2 g via INTRAVENOUS
  Filled 2022-04-04 (×6): qty 100

## 2022-04-04 MED ORDER — PROPOFOL 10 MG/ML IV BOLUS
INTRAVENOUS | Status: AC
  Filled 2022-04-04: qty 20

## 2022-04-04 MED ORDER — BISACODYL 10 MG RE SUPP: 10.0000 mg | Freq: Every day | RECTAL | Status: DC

## 2022-04-04 MED ORDER — LACTATED RINGERS IV SOLN: INTRAVENOUS | Status: DC | PRN

## 2022-04-04 MED ORDER — VANCOMYCIN HCL IN DEXTROSE 1-5 GM/200ML-% IV SOLN
1000.0000 mg | Freq: Once | INTRAVENOUS | Status: AC
  Administered 2022-04-04: 1000 mg via INTRAVENOUS
  Filled 2022-04-04: qty 200

## 2022-04-04 MED ORDER — BISACODYL 5 MG PO TBEC
10.0000 mg | DELAYED_RELEASE_TABLET | Freq: Every day | ORAL | Status: DC
  Administered 2022-04-05 – 2022-04-07 (×3): 10 mg via ORAL
  Filled 2022-04-04 (×4): qty 2

## 2022-04-04 MED ORDER — MAGNESIUM SULFATE 4 GM/100ML IV SOLN
4.0000 g | Freq: Once | INTRAVENOUS | Status: AC
  Administered 2022-04-04: 4 g via INTRAVENOUS
  Filled 2022-04-04: qty 100

## 2022-04-04 MED ORDER — PROTAMINE SULFATE 10 MG/ML IV SOLN
INTRAVENOUS | Status: AC
  Filled 2022-04-04: qty 25

## 2022-04-04 MED ORDER — ROCURONIUM BROMIDE 10 MG/ML (PF) SYRINGE
PREFILLED_SYRINGE | INTRAVENOUS | Status: AC
  Filled 2022-04-04: qty 10

## 2022-04-04 MED ORDER — NOREPINEPHRINE 4 MG/250ML-% IV SOLN
0.0000 ug/min | INTRAVENOUS | Status: DC
  Administered 2022-04-04 (×2): 2 ug/min via INTRAVENOUS

## 2022-04-04 MED ORDER — ASPIRIN 81 MG PO CHEW: 324.0000 mg | CHEWABLE_TABLET | Freq: Every day | ORAL | Status: DC

## 2022-04-04 MED ORDER — ACETAMINOPHEN 160 MG/5ML PO SOLN
1000.0000 mg | Freq: Four times a day (QID) | ORAL | Status: DC
  Administered 2022-04-04 – 2022-04-05 (×2): 1000 mg
  Filled 2022-04-04 (×2): qty 40.6

## 2022-04-04 MED ORDER — METOPROLOL TARTRATE 25 MG/10 ML ORAL SUSPENSION: 12.5000 mg | Freq: Two times a day (BID) | ORAL | Status: DC

## 2022-04-04 MED ORDER — SODIUM CHLORIDE 0.45 % IV SOLN: INTRAVENOUS | Status: DC | PRN

## 2022-04-04 MED ORDER — ALBUMIN HUMAN 5 % IV SOLN
250.0000 mL | INTRAVENOUS | Status: AC | PRN
  Administered 2022-04-04 (×4): 12.5 g via INTRAVENOUS
  Filled 2022-04-04 (×2): qty 250

## 2022-04-04 MED ORDER — ALBUMIN HUMAN 5 % IV SOLN: INTRAVENOUS | Status: DC | PRN

## 2022-04-04 MED ORDER — NITROGLYCERIN IN D5W 200-5 MCG/ML-% IV SOLN: 0.0000 ug/min | INTRAVENOUS | Status: DC

## 2022-04-04 MED ORDER — METOPROLOL TARTRATE 12.5 MG HALF TABLET
12.5000 mg | ORAL_TABLET | Freq: Two times a day (BID) | ORAL | Status: DC
  Administered 2022-04-05 – 2022-04-09 (×7): 12.5 mg via ORAL
  Filled 2022-04-04 (×9): qty 1

## 2022-04-04 MED ORDER — FENTANYL CITRATE (PF) 250 MCG/5ML IJ SOLN
INTRAMUSCULAR | Status: DC | PRN
  Administered 2022-04-04: 50 ug via INTRAVENOUS
  Administered 2022-04-04: 150 ug via INTRAVENOUS
  Administered 2022-04-04: 50 ug via INTRAVENOUS
  Administered 2022-04-04 (×2): 100 ug via INTRAVENOUS
  Administered 2022-04-04 (×2): 50 ug via INTRAVENOUS
  Administered 2022-04-04: 250 ug via INTRAVENOUS
  Administered 2022-04-04 (×2): 100 ug via INTRAVENOUS
  Administered 2022-04-04: 50 ug via INTRAVENOUS
  Administered 2022-04-04: 150 ug via INTRAVENOUS
  Administered 2022-04-04: 50 ug via INTRAVENOUS

## 2022-04-04 MED ORDER — PANTOPRAZOLE SODIUM 40 MG PO TBEC
40.0000 mg | DELAYED_RELEASE_TABLET | Freq: Every day | ORAL | Status: DC
  Administered 2022-04-06 – 2022-04-09 (×4): 40 mg via ORAL
  Filled 2022-04-04 (×4): qty 1

## 2022-04-04 MED ORDER — ICOSAPENT ETHYL 1 G PO CAPS
2.0000 g | ORAL_CAPSULE | Freq: Two times a day (BID) | ORAL | Status: DC
  Administered 2022-04-05 – 2022-04-09 (×9): 2 g via ORAL
  Filled 2022-04-04 (×12): qty 2

## 2022-04-04 MED ORDER — SODIUM CHLORIDE 0.9% IV SOLUTION: Freq: Once | INTRAVENOUS | Status: AC

## 2022-04-04 MED ORDER — FENOFIBRATE 160 MG PO TABS
160.0000 mg | ORAL_TABLET | Freq: Every day | ORAL | Status: DC
  Administered 2022-04-05 – 2022-04-09 (×5): 160 mg via ORAL
  Filled 2022-04-04 (×5): qty 1

## 2022-04-04 MED ORDER — HEPARIN SODIUM (PORCINE) 1000 UNIT/ML IJ SOLN
INTRAMUSCULAR | Status: AC
  Filled 2022-04-04: qty 1

## 2022-04-04 MED ORDER — SODIUM CHLORIDE 0.9 % IV SOLN: INTRAVENOUS | Status: DC

## 2022-04-04 MED ORDER — BUPIVACAINE LIPOSOME 1.3 % IJ SUSP
INTRAMUSCULAR | Status: AC
  Filled 2022-04-04: qty 20

## 2022-04-04 MED ORDER — LACTATED RINGERS IV SOLN: INTRAVENOUS | Status: DC

## 2022-04-04 MED ORDER — PHENYLEPHRINE 80 MCG/ML (10ML) SYRINGE FOR IV PUSH (FOR BLOOD PRESSURE SUPPORT)
PREFILLED_SYRINGE | INTRAVENOUS | Status: AC
  Filled 2022-04-04: qty 20

## 2022-04-04 MED ORDER — ONDANSETRON HCL 4 MG/2ML IJ SOLN: 4.0000 mg | Freq: Four times a day (QID) | INTRAMUSCULAR | Status: DC | PRN

## 2022-04-04 MED ORDER — ACETAMINOPHEN 160 MG/5ML PO SOLN
650.0000 mg | Freq: Once | ORAL | Status: AC
  Administered 2022-04-04: 650 mg

## 2022-04-04 MED ORDER — PLASMA-LYTE A IV SOLN: INTRAVENOUS | Status: DC | PRN

## 2022-04-04 MED ORDER — MIDAZOLAM HCL 2 MG/2ML IJ SOLN: 2.0000 mg | INTRAMUSCULAR | Status: DC | PRN

## 2022-04-04 MED ORDER — PHENYLEPHRINE HCL-NACL 20-0.9 MG/250ML-% IV SOLN
0.0000 ug/min | INTRAVENOUS | Status: DC
  Administered 2022-04-04: 50 ug/min via INTRAVENOUS
  Administered 2022-04-04: 65 ug/min via INTRAVENOUS
  Filled 2022-04-04 (×2): qty 250

## 2022-04-04 MED ORDER — PHENYLEPHRINE 80 MCG/ML (10ML) SYRINGE FOR IV PUSH (FOR BLOOD PRESSURE SUPPORT)
PREFILLED_SYRINGE | INTRAVENOUS | Status: DC | PRN
  Administered 2022-04-04 (×2): 80 ug via INTRAVENOUS
  Administered 2022-04-04: 160 ug via INTRAVENOUS
  Administered 2022-04-04 (×2): 80 ug via INTRAVENOUS
  Administered 2022-04-04: 160 ug via INTRAVENOUS
  Administered 2022-04-04 (×3): 80 ug via INTRAVENOUS

## 2022-04-04 MED ORDER — BUPIVACAINE LIPOSOME 1.3 % IJ SUSP: INTRAMUSCULAR | Status: DC | PRN

## 2022-04-04 MED ORDER — BUPIVACAINE HCL (PF) 0.5 % IJ SOLN
INTRAMUSCULAR | Status: AC
  Filled 2022-04-04: qty 30

## 2022-04-04 MED ORDER — POTASSIUM CHLORIDE 10 MEQ/50ML IV SOLN
10.0000 meq | INTRAVENOUS | Status: AC
  Administered 2022-04-04 (×3): 10 meq via INTRAVENOUS
  Filled 2022-04-04: qty 50

## 2022-04-04 MED ORDER — INSULIN ASPART 100 UNIT/ML IJ SOLN
0.0000 [IU] | INTRAMUSCULAR | Status: DC | PRN
  Administered 2022-04-04: 4 [IU] via SUBCUTANEOUS
  Filled 2022-04-04: qty 1

## 2022-04-04 MED ORDER — PHENYLEPHRINE 80 MCG/ML (10ML) SYRINGE FOR IV PUSH (FOR BLOOD PRESSURE SUPPORT)
PREFILLED_SYRINGE | INTRAVENOUS | Status: AC
  Filled 2022-04-04: qty 10

## 2022-04-04 MED ORDER — DEXTROSE 50 % IV SOLN
INTRAVENOUS | Status: AC
  Filled 2022-04-04: qty 50

## 2022-04-04 MED ORDER — DOCUSATE SODIUM 100 MG PO CAPS
200.0000 mg | ORAL_CAPSULE | Freq: Every day | ORAL | Status: DC
  Administered 2022-04-05 – 2022-04-07 (×3): 200 mg via ORAL
  Filled 2022-04-04 (×5): qty 2

## 2022-04-04 MED ORDER — 0.9 % SODIUM CHLORIDE (POUR BTL) OPTIME
TOPICAL | Status: DC | PRN
  Administered 2022-04-04: 6000 mL

## 2022-04-04 MED ORDER — ACETAMINOPHEN 650 MG RE SUPP: 650.0000 mg | Freq: Once | RECTAL | Status: AC

## 2022-04-04 MED ORDER — MIDAZOLAM HCL (PF) 10 MG/2ML IJ SOLN
INTRAMUSCULAR | Status: AC
  Filled 2022-04-04: qty 2

## 2022-04-04 MED ORDER — ASPIRIN 325 MG PO TBEC
325.0000 mg | DELAYED_RELEASE_TABLET | Freq: Every day | ORAL | Status: DC
  Administered 2022-04-05 – 2022-04-07 (×3): 325 mg via ORAL
  Filled 2022-04-04 (×3): qty 1

## 2022-04-04 MED ORDER — EPHEDRINE SULFATE-NACL 50-0.9 MG/10ML-% IV SOSY
PREFILLED_SYRINGE | INTRAVENOUS | Status: DC | PRN
  Administered 2022-04-04: 5 mg via INTRAVENOUS

## 2022-04-04 MED ORDER — FAMOTIDINE IN NACL 20-0.9 MG/50ML-% IV SOLN
20.0000 mg | Freq: Two times a day (BID) | INTRAVENOUS | Status: AC
  Administered 2022-04-04 (×2): 20 mg via INTRAVENOUS
  Filled 2022-04-04 (×2): qty 50

## 2022-04-04 MED ORDER — VANCOMYCIN HCL 1000 MG IV SOLR: INTRAVENOUS | Status: DC | PRN

## 2022-04-04 MED ORDER — METOPROLOL TARTRATE 5 MG/5ML IV SOLN: 2.5000 mg | INTRAVENOUS | Status: DC | PRN

## 2022-04-04 MED ORDER — SODIUM CHLORIDE 0.9% FLUSH
3.0000 mL | Freq: Two times a day (BID) | INTRAVENOUS | Status: DC
  Administered 2022-04-05 – 2022-04-08 (×7): 3 mL via INTRAVENOUS

## 2022-04-04 SURGICAL SUPPLY — 99 items
ADAPTER MULTI PERFUSION 15 (ADAPTER) ×2 IMPLANT
APPLIER CLIP 11 MED OPEN (CLIP) ×2
APPLIER CLIP 9.375 SM OPEN (CLIP) ×8
BAG DECANTER FOR FLEXI CONT (MISCELLANEOUS) ×2 IMPLANT
BLADE CLIPPER SURG (BLADE) IMPLANT
BLADE MINI 60D BLUE (BLADE) ×2 IMPLANT
BLADE STERNUM SYSTEM 6 (BLADE) ×2 IMPLANT
BLADE SURG 11 STRL SS (BLADE) IMPLANT
BLOWER MISTER CAL-MED (MISCELLANEOUS) ×2 IMPLANT
BNDG ELASTIC 4X5.8 VLCR STR LF (GAUZE/BANDAGES/DRESSINGS) ×2 IMPLANT
BNDG ELASTIC 6X5.8 VLCR STR LF (GAUZE/BANDAGES/DRESSINGS) ×2 IMPLANT
BNDG GAUZE DERMACEA FLUFF 4 (GAUZE/BANDAGES/DRESSINGS) ×2 IMPLANT
BULB SUCTION JP 400 (SUCTIONS) ×2 IMPLANT
CANISTER SUCT 3000ML PPV (MISCELLANEOUS) ×2 IMPLANT
CANNULA AORTIC ROOT 9FR (CANNULA) ×2 IMPLANT
CANNULA EZ GLIDE AORTIC 21FR (CANNULA) IMPLANT
CANNULA MC2 2 STG 29/37 NON-V (CANNULA) ×2 IMPLANT
CANNULA MC2 TWO STAGE (CANNULA) ×2
CANNULA VESSEL 3MM 2 BLNT TIP (CANNULA) ×2 IMPLANT
CANNULA VESSEL 3MM BLUNT TIP (CANNULA) IMPLANT
CATH ROBINSON RED A/P 18FR (CATHETERS) ×6 IMPLANT
CLIP APPLIE 11 MED OPEN (CLIP) ×2 IMPLANT
CLIP APPLIE 9.375 SM OPEN (CLIP) ×4 IMPLANT
CLIP VESOCCLUDE MED 24/CT (CLIP) IMPLANT
CLIP VESOCCLUDE SM WIDE 24/CT (CLIP) IMPLANT
CONNECTOR BLAKE 2:1 CARIO BLK (MISCELLANEOUS) IMPLANT
COVER BACK TABLE 60X90IN (DRAPES) IMPLANT
DEFOGGER ANTIFOG KIT (MISCELLANEOUS) IMPLANT
DERMABOND ADVANCED .7 DNX12 (GAUZE/BANDAGES/DRESSINGS) ×4 IMPLANT
DRAIN CHANNEL 24FR (DRAIN) ×4 IMPLANT
DRAIN JACKSON PRATT 10MM FLAT (MISCELLANEOUS) ×2 IMPLANT
DRAPE CARDIOVASCULAR INCISE (DRAPES) ×2
DRAPE SRG 135X102X78XABS (DRAPES) ×2 IMPLANT
DRSG AQUACEL AG ADV 3.5X14 (GAUZE/BANDAGES/DRESSINGS) ×2 IMPLANT
ELECT REM PT RETURN 9FT ADLT (ELECTROSURGICAL) ×4
ELECTRODE REM PT RTRN 9FT ADLT (ELECTROSURGICAL) ×4 IMPLANT
FELT TEFLON 1X6 (MISCELLANEOUS) ×2 IMPLANT
GAUZE 4X4 16PLY ~~LOC~~+RFID DBL (SPONGE) IMPLANT
GAUZE SPONGE 4X4 12PLY STRL (GAUZE/BANDAGES/DRESSINGS) ×4 IMPLANT
GLOVE BIO SURGEON STRL SZ 6 (GLOVE) IMPLANT
GLOVE BIO SURGEON STRL SZ 6.5 (GLOVE) IMPLANT
GLOVE BIO SURGEON STRL SZ8 (GLOVE) IMPLANT
GLOVE BIOGEL M 8.0 STRL (GLOVE) ×6 IMPLANT
GLOVE SS BIOGEL STRL SZ 6 (GLOVE) IMPLANT
GOWN STRL REUS W/ TWL LRG LVL3 (GOWN DISPOSABLE) ×6 IMPLANT
GOWN STRL REUS W/ TWL XL LVL3 (GOWN DISPOSABLE) ×6 IMPLANT
GOWN STRL REUS W/TWL LRG LVL3 (GOWN DISPOSABLE) ×16
GOWN STRL REUS W/TWL XL LVL3 (GOWN DISPOSABLE) ×4
HEMOSTAT SURGICEL NU-KNIT 6X9 (HEMOSTASIS) ×2 IMPLANT
INSERT FOGARTY XLG (MISCELLANEOUS) ×2 IMPLANT
KIT BASIN OR (CUSTOM PROCEDURE TRAY) ×2 IMPLANT
KIT TURNOVER KIT B (KITS) ×2 IMPLANT
KIT VASOVIEW HEMOPRO 2 VH 4000 (KITS) ×2 IMPLANT
KNIFE MICRO-UNI 3.5 30 DEG (BLADE) ×2 IMPLANT
LEAD PACING MYOCARDI (MISCELLANEOUS) ×2 IMPLANT
NS IRRIG 1000ML POUR BTL (IV SOLUTION) ×10 IMPLANT
OFFPUMP STABILIZER SUV (MISCELLANEOUS) ×2 IMPLANT
PACK E OPEN HEART (SUTURE) ×2 IMPLANT
PACK OPEN HEART (CUSTOM PROCEDURE TRAY) ×2 IMPLANT
PAD ARMBOARD 7.5X6 YLW CONV (MISCELLANEOUS) ×4 IMPLANT
PAD ELECT DEFIB RADIOL ZOLL (MISCELLANEOUS) ×2 IMPLANT
PENCIL BUTTON HOLSTER BLD 10FT (ELECTRODE) IMPLANT
POSITIONER HEAD DONUT 9IN (MISCELLANEOUS) ×2 IMPLANT
POWDER SURGICEL 3.0 GRAM (HEMOSTASIS) IMPLANT
PROBE INTRAVASCULAR 45 (VASCULAR PRODUCTS) ×2 IMPLANT
PUNCH AORTIC ROTATE 4.0MM (MISCELLANEOUS) IMPLANT
SET MPS 3-ND DEL (MISCELLANEOUS) IMPLANT
SPONGE T-LAP 18X18 ~~LOC~~+RFID (SPONGE) IMPLANT
SUT BONE WAX W31G (SUTURE) ×2 IMPLANT
SUT MNCRL AB 3-0 PS2 27 (SUTURE) ×4 IMPLANT
SUT MNCRL AB 4-0 PS2 18 (SUTURE) IMPLANT
SUT PROLENE 4 0 RB 1 (SUTURE) ×4
SUT PROLENE 4 0 SH DA (SUTURE) ×4 IMPLANT
SUT PROLENE 4-0 RB1 .5 CRCL 36 (SUTURE) ×4 IMPLANT
SUT PROLENE 5 0 C 1 36 (SUTURE) IMPLANT
SUT PROLENE 6 0 C 1 30 (SUTURE) IMPLANT
SUT PROLENE 7 0 BV 1 (SUTURE) IMPLANT
SUT PROLENE 7 0 BV1 MDA (SUTURE) IMPLANT
SUT SILK  1 MH (SUTURE) ×10
SUT SILK 1 MH (SUTURE) ×12 IMPLANT
SUT SILK 2 0 SH (SUTURE) IMPLANT
SUT STEEL 6MS V (SUTURE) IMPLANT
SUT STEEL STERNAL CCS#1 18IN (SUTURE) ×4 IMPLANT
SUT VIC AB 1 CTX 36 (SUTURE) ×4
SUT VIC AB 1 CTX36XBRD ANBCTR (SUTURE) IMPLANT
SUT VIC AB 2-0 CT1 27 (SUTURE) ×2
SUT VIC AB 2-0 CT1 TAPERPNT 27 (SUTURE) IMPLANT
SUT VIC AB 2-0 CTX 27 (SUTURE) ×4 IMPLANT
SYSTEM SAHARA CHEST DRAIN ATS (WOUND CARE) ×4 IMPLANT
TAPE CLOTH SURG 4X10 WHT LF (GAUZE/BANDAGES/DRESSINGS) IMPLANT
TAPE PAPER 2X10 WHT MICROPORE (GAUZE/BANDAGES/DRESSINGS) IMPLANT
TOWEL GREEN STERILE (TOWEL DISPOSABLE) ×2 IMPLANT
TOWEL GREEN STERILE FF (TOWEL DISPOSABLE) ×2 IMPLANT
TRAY FOLEY SLVR 16FR TEMP STAT (SET/KITS/TRAYS/PACK) ×2 IMPLANT
TUBE CONNECTING 20X1/4 (TUBING) IMPLANT
TUBE SUCT INTRACARD DLP 20F (MISCELLANEOUS) ×2 IMPLANT
TUBING LAP HI FLOW INSUFFLATIO (TUBING) ×2 IMPLANT
UNDERPAD 30X36 HEAVY ABSORB (UNDERPADS AND DIAPERS) ×2 IMPLANT
WATER STERILE IRR 1000ML POUR (IV SOLUTION) ×4 IMPLANT

## 2022-04-04 NOTE — Anesthesia Procedure Notes (Signed)
Central Venous Catheter Insertion Performed by: Darral Dash, DO, anesthesiologist Start/End1/02/2023 7:06 AM, 04/04/2022 7:07 AM Patient location: Pre-op. Preanesthetic checklist: patient identified, IV checked, site marked, risks and benefits discussed, surgical consent, monitors and equipment checked, pre-op evaluation, timeout performed and anesthesia consent Position: supine Hand hygiene performed  and maximum sterile barriers used  PA cath was placed.Swan type:thermodilution PA Cath depth:38 Procedure performed without using ultrasound guided technique. Attempts: 1 Patient tolerated the procedure well with no immediate complications.

## 2022-04-04 NOTE — Transfer of Care (Signed)
Immediate Anesthesia Transfer of Care Note  Patient: Danny [name]  Procedure(s) Performed: CORONARY ARTERY BYPASS GRAFTING (CABG) X4 USING LEFT INTERNAL MAMMARY ARTERY AND ENDOSCOPIC GREATER SAPHENOUS VEIN HARVEST ON THE LEFT LEG (Chest) TRANSESOPHAGEAL ECHOCARDIOGRAM (TEE)  Patient Location: SICU  Anesthesia Type:General  Level of Consciousness: Patient remains intubated per anesthesia plan  Airway & Oxygen Therapy: Patient remains intubated per anesthesia plan and Patient placed on Ventilator (see vital sign flow sheet for setting)  Post-op Assessment: Report given to RN and Post -op Vital signs reviewed and stable  Post vital signs: Reviewed and stable  Last Vitals:  Vitals Value Taken Time  BP    Temp 36.4 C 04/04/22 1458  Pulse 80 04/04/22 1458  Resp 12 04/04/22 1458  SpO2 100 % 04/04/22 1458  Vitals shown include unvalidated device data.  Last Pain:  Vitals:   04/04/22 0603  TempSrc: Oral  PainSc:          Complications: No notable events documented.

## 2022-04-04 NOTE — TOC CM/SW Note (Signed)
..  Transition of Care [facility]) Screening Note   Patient Details  Name: Danny [name] Date of Birth: 04/01/1965   Transition of Care [facility]) CM/SW Contact:    Erenest Rasher, RN Phone Number: [phone] 04/04/2022, 3:03 PM    Transition of Care Department [facility]) has reviewed patient. We will continue to monitor patient advancement through interdisciplinary progression rounds. Will continue to follow for dc needs. [place], Heart Failure TOC CM [phone]

## 2022-04-04 NOTE — Hospital Course (Signed)
History of Present Illness:  Danny [name] is a 57 yo male with history of DM, Dyslipidemia, Hypertriglyceridemia, and CAD.  His CAD began in 2006 at which time he underwent PCI to distal RCA.  He did well until 2014 at which time this stent was re-stenosed.  He underwent repeat PCI procedure at that time with overlapping stent placement.  He has been cathed in 2016 and 2019 during which additional stenting to his RCA was performed.  He was hospitalized in 2020 with unstable angina.  He was again catheter and had repeat PCI at that time.  He was also started on Ranexa.  The patient presented to Cardiology in [facility] for a second opinion.  He was evaluated by Dr. Percival Spanish at which time patient continued to complain of anginal symptoms.  This typically would occur with walking up an incline but mainly now occurs after eating a meal.  He is limited activity wise due to his angina symptoms and he would like to be able to be more active.  It was felt repeat cardiac catheterization would be indicated.  This was performed by Dr. Angelena Form in December and showed severe 3 vessel CAD.  It was felt coronary bypass grafting would be the best treatment option for the patient and he was referred for outpatient evaluation.  He was evaluated by Dr. Tenny Craw who felt coronary bypass grafting would be beneficial for this patient.  The risks and benefits of the procedure were explained to the patient and he was agreeable to proceed.  Hospital Course:  [name] presented to [facility] on 04/04/2022.  He was taken to the operating room and underwent CABG x 4 utilizing LIMA to LAD, SVG to PDA, SVG to PLA, and SVG to OM.  He also underwent endoscopic harvest of greater saphenous vein from his left leg.  He tolerated the procedure without difficulty and was taken to the SICU in stable condition.

## 2022-04-04 NOTE — Progress Notes (Signed)
  Echocardiogram Echocardiogram Transesophageal has been performed.  Danny [name] 04/04/2022, 8:40 AM

## 2022-04-04 NOTE — Anesthesia Procedure Notes (Signed)
Central Venous Catheter Insertion Performed by: Darral Dash, DO, anesthesiologist Start/End1/02/2023 6:55 AM, 04/04/2022 7:05 AM Patient location: Pre-op. Preanesthetic checklist: patient identified, IV checked, site marked, risks and benefits discussed, surgical consent, monitors and equipment checked, pre-op evaluation, timeout performed and anesthesia consent Position: Trendelenburg Lidocaine 1% used for infiltration and patient sedated Hand hygiene performed  and maximum sterile barriers used  Catheter size: 8.5 Fr Central line was placed.Sheath introducer Procedure performed using ultrasound guided technique. Ultrasound Notes:anatomy identified, needle tip was noted to be adjacent to the nerve/plexus identified, no ultrasound evidence of intravascular and/or intraneural injection and image(s) printed for medical record Attempts: 1 Following insertion, line sutured, dressing applied and Biopatch. Post procedure assessment: blood return through all ports, free fluid flow and no air  Patient tolerated the procedure well with no immediate complications.

## 2022-04-04 NOTE — Anesthesia Procedure Notes (Signed)
Arterial Line Insertion Start/End1/02/2023 6:50 AM, 04/04/2022 7:00 AM Performed by: Gaylene Brooks, CRNA  Patient location: Pre-op. Preanesthetic checklist: patient identified, IV checked, site marked, risks and benefits discussed, surgical consent, monitors and equipment checked, pre-op evaluation, timeout performed and anesthesia consent Lidocaine 1% used for infiltration and patient sedated radial was placed Catheter size: 20 G Hand hygiene performed  and maximum sterile barriers used   Attempts: 1 Procedure performed without using ultrasound guided technique. Following insertion, dressing applied and Biopatch. Post procedure assessment: normal  Patient tolerated the procedure well with no immediate complications.

## 2022-04-04 NOTE — Progress Notes (Signed)
Pt failed rapid wean with a NIF -7 with good effort. MD notified.

## 2022-04-04 NOTE — Interval H&P Note (Signed)
Proceed to OR, no sig changes to h and p

## 2022-04-04 NOTE — Brief Op Note (Signed)
04/04/2022  1:37 PM  PATIENT:  Clois Dupes  57 y.o. male  PRE-OPERATIVE DIAGNOSIS:  CORONARY ARTERY DISEASE  POST-OPERATIVE DIAGNOSIS:  CORONARY ARTERY DISEASE  PROCEDURE:  Procedure(s):  CORONARY ARTERY BYPASS GRAFTING x 4 -LIMA to LAD -SVG to OM -SVG to PDA -SVG to PLA  ENDOSCOPIC HARVEST OF GREATER SAPHENOUS VEIN  --Left Leg  Vein harvest time: 50 min Vein prep time: 20 min  SURGEON:  Surgeon(s) and Role:    * Enter, Pierre Bali, MD - Primary  PHYSICIAN ASSISTANT: Ellwood Handler PA-C  ASSISTANTS: Ara Kussmaul RNFA   ANESTHESIA:   general  EBL:  800 mL   BLOOD ADMINISTERED:   CC CELLSAVER  DRAINS:  Left Pleural Chest Tube, Mediastinal Chest Drain    LOCAL MEDICATIONS USED:  NONE  SPECIMEN:  No Specimen  DISPOSITION OF SPECIMEN:  N/A  COUNTS:  YES  TOURNIQUET:  * No tourniquets in log *  DICTATION: .Dragon Dictation  PLAN OF CARE: Admit to inpatient   PATIENT DISPOSITION:  ICU - intubated and hemodynamically stable.   Delay start of Pharmacological VTE agent (>24hrs) due to surgical blood loss or risk of bleeding: yes

## 2022-04-04 NOTE — Op Note (Addendum)
OPERATIVE NOTE: Patient Name: Danny [name] Date of Birth: 08-06-1965 Date of Operation: 04/04/21  PRE-OPERATIVE DIAGNOSIS: Coronary artery disease History of multiple PCI DM2 HLD GERD  POST-OPERATIVE DIAGNOSIS: Same  OPERATION: CABG x 4 (LIMA - LAD, SVG - OM2, SVG - PDA, SVG-PLA) Endoscopic saphenous vein harvest  SURGEON: Pierre Bali Kharter Brew MD   ASSISTANT: Ellwood Handler PA  EBL 100cc  FINDINGS: EF normal before surgery EF normal after surgery   Adequate conduits and targets LAD 2.0 mm, bridged a significant lesion with anastomosis OM2 mm, 1.75 flow 40 cc/min PDA mm, 2.0 flow 35 cc/min PLA 1.64mm, flow 40cc/min  TIMES: XC:  113 min CPB: 138 min   SPECIMENS None   COMPLICATIONS: None  TUBES:  2 24 Fr blakes (left pleural and mediastinal) 1 JP to Bulb (mediastinal)   PROCEDURE IN DETAIL: The patient was brought in the operating room and laid in supine position.  The patient was prepped and draped in standard fashion.  An arterial, pulmonary arterial and venous lines were placed by anesthesia along with a single-lumen endotracheal tube. Vein was harvested out of the left leg endoscopically by the physician assistant. The right leg was looked at with the scope and was though to be smaller and lower quality. It was not removed. Local analgesia given to the sternum. Sternotomy was performed and LIMA taken down after 5000u heparin given. the pericardium was opened.  Full dose heparin was given to achieve an ACT of 480 and aortic and venous cannulas were inserted.The patient was placed on cardiopulmonary bypass and using a cross-clamp and cardiac arrest was achieved with 1.2  L of modified blood [place] cardioplegia antegrade. Topical cooling was used as well on the heart.  OM2 sewn distally with 7-0 prolene, then proximal with 6-0 prolene. PDA sewn distally with 7-0 prolene, then proximal to the aorta with 6-0 prolene.  PLA sewn distally with 7-0 prolene, then proximal to  the aorta with 6-0 prolene. The PLA graft was brought around the obtuse margin of the heart as it was oriented similar to an OM3 territory. Very right dominant circulation. After vein grafts complete, LAD was opened. We did open the LAD  across what appeared on cath to be a very significant mid-distal LAD lesion. This will support flow both distally and proximally to the septals and diagonals. The LIMA sewn to LAD with 7-0 prolene.     Next, the cross-clamp was released  with the root vent on.  After de-airing the heart came back into regular sinus rhythm and the heart took over the circulation. Bypass was weaned and cannulas were removed, then protamine was given and hemostasis was achieved. Chest tubes were placed as above along with ventricular and atrial wires.  The chest was then closed in interrupted steel wire and the presternal layers were closed in 3 layers of absorbable suture.  The patient had a stable status and was transferred to the postoperative care unit on zero of epinephrine and zero of Levophed and 40 of neo and paced at 80. All surgical counts were correct.  Patient had a generalized coagulopathy including chest tube sites and 2 units of FFP were administered as well.

## 2022-04-04 NOTE — Anesthesia Procedure Notes (Signed)
Procedure Name: Intubation Date/Time: 04/04/2022 7:56 AM  Performed by: Gaylene Brooks, CRNAPre-anesthesia Checklist: Patient identified, Emergency Drugs available, Suction available and Patient being monitored Patient Re-evaluated:Patient Re-evaluated prior to induction Oxygen Delivery Method: Circle System Utilized Preoxygenation: Pre-oxygenation with 100% oxygen Induction Type: IV induction Ventilation: Mask ventilation without difficulty and Oral airway inserted - appropriate to patient size Laryngoscope Size: Sabra Heck and 2 Grade View: Grade II Tube type: Oral Tube size: 8.0 mm Number of attempts: 1 Airway Equipment and Method: Stylet and Oral airway Placement Confirmation: ETT inserted through vocal cords under direct vision, positive ETCO2 and breath sounds checked- equal and bilateral Secured at: 22 cm Tube secured with: Tape Dental Injury: Teeth and Oropharynx as per pre-operative assessment

## 2022-04-04 NOTE — Progress Notes (Signed)
[address]       [place] [address]             [phone]       EVENING ROUNDS  POD#0 SP CABG Hemodynamics good Not bleeding Starting to wean sedation to extubate

## 2022-04-04 NOTE — Consult Note (Addendum)
NAME:  Danny [name], MRN:  [id number], DOB:  07/20/1965, LOS: 0 ADMISSION DATE:  04/04/2022, CONSULTATION DATE:  04/04/22  REFERRING MD:  Tenny Craw , CHIEF COMPLAINT:  s/p CABG x4   History of Present Illness:  57 yo M PMH multivessel CAD with prior stents, HTN, HLD, DM2  who presented 04/04/22 for planned CABG x 4 LIMA - LAD, SVG - OM, SVG - PDA, SVG - PLA. Only unusual aspect of case was some hypoglycemia early in the case req small amount of D50. Otherwise unremarkable course    cross clamp time- 113 min Total pump time- 138 min   EBL 800 Received 480 cellsaver and 2 FFP   PCCM is consulted postoperatively in this setting   Pertinent  Medical History  Multivessel CAD DM HTN HLD   Significant Hospital Events: Including procedures, antibiotic start and stop dates in addition to other pertinent events   1/12 CABG x 4 planned admit to ICU post op   Interim History / Subjective:  POD 0 CABG x4 EBL 842ml   cross clamp time- 113 min Total pump time- 138 min   Comes out on 2 epi and 25 neo  Received NMB 70min prior to ICU arrival   Objective   Blood pressure 124/83, pulse 64, temperature 98 F (36.7 C), temperature source Oral, resp. rate 17, height 5\' 6"  (1.676 m), weight 72.6 kg, SpO2 97 %.        Intake/Output Summary (Last 24 hours) at 04/04/2022 1408 Last data filed at 04/04/2022 1353 Gross per 24 hour  Intake 3134 ml  Output 2305 ml  Net 829 ml   Filed Weights   04/04/22 0603  Weight: 72.6 kg    Examination: General: Critically ill middle aged M intubated sedated NAD HENT: NCAT pink mm ETT secure  Lungs: CTAb mechanically ventilated  Cardiovascular: rrr, paced. Chest tube with bloody output  Abdomen: soft nd hypoactive  Extremities: no acute joint deformity. LLE wrapped  Neuro: sedated and chemically paralyzed  GU: foley with yellow urine   Resolved Hospital Problem list     Assessment & Plan:   57 yo M underwent CABG x4 04/04/22. PCCM is consulted POD0  for post op management.    Multivessel CAD s/p CABG x4  Unstable angina  Post bypass vasoplegic syndrome  Post operative ventilator management, expected  Post op anemia, expected Thrombocytopenia, consumptive, expected  Coagulopathy, consumptive, expected  DM2 Hx HTN HLD  P -post op per CVTS -follow chest tube output, complete abx ppx, txa  -to get 1 plt from TEG in OR -check post op labs  -on epi, neo -- wean based on indices  -if neo isnt decreasing, will change to NE  -CXR in ICU -ABG/weaning per protocol -- anticipate rapid wean protocol  -VAP, pulm hygiene -insulin gtt -statin, antiplt, BB per primary -maintain foley, follow UOP and renal indices    Best Practice (right click and "Reselect all SmartList Selections" daily)   Diet/type: NPO DVT prophylaxis: n/a completing txa  GI prophylaxis: H2B and PPI Lines: Central line, Arterial Line, and yes and it is still needed Foley:  Yes, and it is still needed Code Status:  full code Last date of multidisciplinary goals of care discussion [--]  Labs   CBC: Recent Labs  Lab 04/04/22 1137 04/04/22 1154 04/04/22 1206 04/04/22 1244 04/04/22 1247  HGB 6.8* 8.0* 7.8* 7.5* 7.8*  HCT 20.0* 22.8* 23.0* 22.0* 23.0*  PLT  --  122*  --   --   --  Basic Metabolic Panel: Recent Labs  Lab 04/04/22 1032 04/04/22 1106 04/04/22 1137 04/04/22 1206 04/04/22 1244 04/04/22 1247  NA 138 136 134* 137 139 139  K 4.8 4.4 4.5 4.4 3.7 3.7  CL 100 100 99 102  --  102  GLUCOSE 70 120* 126* 130*  --  146*  BUN 14 13 12 13   --  13  CREATININE 0.60* 0.50* 0.50* 0.60*  --  0.60*   GFR: Estimated Creatinine Clearance: 93 mL/min (A) (by C-G formula based on SCr of 0.6 mg/dL (L)). No results for input(s): "PROCALCITON", "WBC", "LATICACIDVEN" in the last 168 hours.  Liver Function Tests: No results for input(s): "AST", "ALT", "ALKPHOS", "BILITOT", "PROT", "ALBUMIN" in the last 168 hours. No results for input(s): "LIPASE",  "AMYLASE" in the last 168 hours. No results for input(s): "AMMONIA" in the last 168 hours.  ABG    Component Value Date/Time   PHART 7.410 04/04/2022 1244   PCO2ART 36.8 04/04/2022 1244   PO2ART 339 (H) 04/04/2022 1244   HCO3 23.3 04/04/2022 1244   TCO2 25 04/04/2022 1247   ACIDBASEDEF 1.0 04/04/2022 1244   O2SAT 100 04/04/2022 1244     Coagulation Profile: No results for input(s): "INR", "PROTIME" in the last 168 hours.  Cardiac Enzymes: No results for input(s): "CKTOTAL", "CKMB", "CKMBINDEX", "TROPONINI" in the last 168 hours.  HbA1C: Hgb A1c MFr Bld  Date/Time Value Ref Range Status  03/26/2022 11:06 AM 8.6 (H) 4.8 - 5.6 % Final    Comment:    (NOTE)         Prediabetes: 5.7 - 6.4         Diabetes: >6.4         Glycemic control for adults with diabetes: <7.0   02/19/2022 11:54 AM 8.2 (H) 4.8 - 5.6 % Final    Comment:             Prediabetes: 5.7 - 6.4          Diabetes: >6.4          Glycemic control for adults with diabetes: <7.0     CBG: Recent Labs  Lab 04/04/22 0555  GLUCAP 216*    Review of Systems:   Unable to obtain, intubated sedated   Past Medical History:  He,  has a past medical history of CAD (coronary artery disease), CAD in native artery, hx of multiple PCIs (08/19/2017), DM2 (diabetes mellitus, type 2) ([place]), GERD (gastroesophageal reflux disease), Headache, HLD (hyperlipidemia), and Obesity.   Surgical History:   Past Surgical History:  Procedure Laterality Date   CARDIAC CATHETERIZATION N/A 01/05/2015   Procedure: Left Heart Cath and Coronary Angiography;  Surgeon: Peter M Martinique, MD;  Location: [facility];  Service: Cardiovascular;  Laterality: N/A;   CARDIAC CATHETERIZATION  01/05/2015   Procedure: Coronary Stent Intervention;  Surgeon: Peter M Martinique, MD;  Location: [place] [facility];  Service: Cardiovascular;;   COLONOSCOPY     CORONARY ANGIOPLASTY WITH STENT PLACEMENT  05/19/2012   DES   to RCA   by Dr Burt Knack   CORONARY  BALLOON ANGIOPLASTY N/A 04/02/2018   Procedure: CORONARY BALLOON ANGIOPLASTY;  Surgeon: Martinique, Peter M, MD;  Location: [facility];  Service: Cardiovascular;  Laterality: N/A;   CORONARY STENT INTERVENTION N/A 08/18/2017   Procedure: CORONARY STENT INTERVENTION;  Surgeon: Leonie Man, MD;  Location: [facility];  Service: Cardiovascular;  Laterality: N/A;   CORONARY STENT PLACEMENT  01/05/2015   mid cx  des   INTRAVASCULAR ULTRASOUND/IVUS N/A 04/02/2018   Procedure: Intravascular Ultrasound/IVUS;  Surgeon: Swaziland, Peter M, MD;  Location: [facility] INVASIVE CV LAB;  Service: Cardiovascular;  Laterality: N/A;   LEFT HEART CATH AND CORONARY ANGIOGRAPHY N/A 08/18/2017   Procedure: LEFT HEART CATH AND CORONARY ANGIOGRAPHY;  Surgeon: Marykay Lex, MD;  Location: [facility] INVAS[facility] LAB;  Service: Cardiovascular;  Laterality: N/A;   LEFT HEART CATH AND CORONARY ANGIOGRAPHY N/A 04/02/2018   Procedure: LEFT HEART CATH AND CORONARY ANGIOGRAPHY;  Surgeon: Swaziland, Peter M, MD;  Location: [facility] INVASIVE CV LAB;  Service: Cardiovascular;  Laterality: N/A;   LEFT HEART CATH AND CORONARY ANGIOGRAPHY N/A 02/27/2022   Procedure: LEFT HEART CATH AND CORONARY ANGIOGRAPHY;  Surgeon: Kathleene Hazel, MD;  Location: [facility] INVASIVE CV LAB;  Service: Cardiovascular;  Laterality: N/A;   PERCUTANEOUS CORONARY STENT INTERVENTION (PCI-S) N/A 05/19/2012   Procedure: PERCUTANEOUS CORONARY STENT INTERVENTION (PCI-S);  Surgeon: Tonny Bollman, MD;  Location: [facility] CATH LAB;  Service: Cardiovascular;  Laterality: N/A;     Social History:   reports that he has never smoked. He has never used smokeless tobacco. He reports current alcohol use of about 7.0 standard drinks of alcohol per week. He reports that he does not use drugs.   Family History:  His family history includes Coronary artery disease (age of onset: 60) in his father; Diabetes in his mother; Heart attack in his father.   Allergies Allergies  Allergen  Reactions   Crestor [Rosuvastatin]     MUSCLE PAIN   Ampicillin Nausea And Vomiting    DID THE REACTION INVOLVE: Swelling of the face/tongue/throat, SOB, or low BP? No Sudden or severe rash/hives, skin peeling, or the inside of the mouth or nose? No Did it require medical treatment? No When did it last happen?      childhood allergy  If all above answers are "NO", may proceed with cephalosporin use.    Penicillins     DID THE REACTION INVOLVE: Swelling of the face/tongue/throat, SOB, or low BP? Unknown Sudden or severe rash/hives, skin peeling, or the inside of the mouth or nose? Unknown Did it require medical treatment? Unknown When did it last happen?      childhood allergy If all above answers are "NO", may proceed with cephalosporin use.      Home Medications  Prior to Admission medications   Medication Sig Start Date End Date Taking? Authorizing Provider  aspirin EC 81 MG EC tablet Take 1 tablet (81 mg total) by mouth daily. 01/06/15  Yes Janetta Hora, PA-C  atorvastatin (LIPITOR) 80 MG tablet TAKE 1 TABLET BY MOUTH DAILY 06/24/19  Yes Georgie Chard D, NP  clopidogrel (PLAVIX) 75 MG tablet TAKE 1 TABLET EVERY DAY 12/23/19  Yes [name], MD  escitalopram (LEXAPRO) 10 MG tablet Take 10 mg by mouth daily. 09/02/20  Yes [provider]  FARXIGA 10 MG TABS tablet Take 10 mg by mouth daily. 09/02/20  Yes [provider]  fenofibrate (TRICOR) 145 MG tablet Take 145 mg by mouth daily. 07/20/20  Yes [provider]  icosapent Ethyl (VASCEPA) 1 g capsule Take 2 capsules (2 g total) by mouth 2 (two) times daily. 03/08/20  Yes [name], MD  isosorbide mononitrate (IMDUR) 60 MG 24 hr tablet Take 1 tablet (60 mg total) by mouth daily. 01/16/22  Yes [name], MD  metFORMIN (GLUCOPHAGE) 1000 MG tablet Take 1,000 mg by mouth 2 (two) times daily with a meal. 05/03/20  Yes  [provider]  ranolazine (RANEXA) 1000 MG SR tablet Take 1,000 mg  by mouth 2 (two) times daily.   Yes [provider]  TRESIBA FLEXTOUCH 200 UNIT/ML SOPN Inject 44 Units into the skin daily. 10/14/17  Yes [provider]  Vitamin D, Ergocalciferol, (DRISDOL) 1.25 MG (50000 UNIT) CAPS capsule Take 50,000 Units by mouth every 7 (seven) days. 07/25/20  Yes [provider]  ARTIFICIAL TEAR SOLUTION OP Place 1 drop into both eyes daily as needed (dry eyes).    [provider]  Continuous Blood Gluc Sensor (FREESTYLE LIBRE 14 DAY SENSOR) MISC SMARTSIG:1 Topical Every 2 Weeks 09/02/20   [provider]  ketoconazole (NIZORAL) 2 % cream Apply 1 Application topically daily as needed for irritation. 04/17/20   [provider]  nitroGLYCERIN (NITROSTAT) 0.4 MG SL tablet PLACE 1 TABLET (0.4 MG TOTAL) UNDER THE TONGUE EVERY 5 (FIVE) MINUTES AS NEEDED FOR CHEST PAIN. 03/22/18   Azalee Course, PA     Critical care time: 39 min    CRITICAL CARE Performed by: Lanier Clam   Total critical care time: 39 minutes  Critical care time was exclusive of separately billable procedures and treating other patients. Critical care was necessary to treat or prevent imminent or life-threatening deterioration.  Critical care was time spent personally by me on the following activities: development of treatment plan with patient and/or surrogate as well as nursing, discussions with consultants, evaluation of patient's response to treatment, examination of patient, obtaining history from patient or surrogate, ordering and performing treatments and interventions, ordering and review of laboratory studies, ordering and review of radiographic studies, pulse oximetry and re-evaluation of patient's condition.  Tessie Fass MSN, AGACNP-BC [facility] Pulmonary/Critical Care Medicine Amion for pager  04/04/2022, 2:54 PM

## 2022-04-05 ENCOUNTER — Inpatient Hospital Stay (HOSPITAL_COMMUNITY): Payer: Managed Care, Other (non HMO)

## 2022-04-05 DIAGNOSIS — J9601 Acute respiratory failure with hypoxia: Secondary | ICD-10-CM | POA: Diagnosis not present

## 2022-04-05 LAB — CBC
HCT: 20.1 % — ABNORMAL LOW (ref 39.0–52.0)
HCT: 25.4 % — ABNORMAL LOW (ref 39.0–52.0)
Hemoglobin: 6.9 g/dL — CL (ref 13.0–17.0)
Hemoglobin: 9.1 g/dL — ABNORMAL LOW (ref 13.0–17.0)
MCH: 30.1 pg (ref 26.0–34.0)
MCH: 31.1 pg (ref 26.0–34.0)
MCHC: 34.3 g/dL (ref 30.0–36.0)
MCHC: 35.8 g/dL (ref 30.0–36.0)
MCV: 87.8 fL (ref 80.0–100.0)
MCV: 86.7 fL (ref 80.0–100.0)
Platelets: 97 10*3/uL — ABNORMAL LOW (ref 150–400)
Platelets: 106 10*3/uL — ABNORMAL LOW (ref 150–400)
RBC: 2.29 MIL/uL — ABNORMAL LOW (ref 4.22–5.81)
RBC: 2.93 MIL/uL — ABNORMAL LOW (ref 4.22–5.81)
RDW: 15.9 % — ABNORMAL HIGH (ref 11.5–15.5)
RDW: 16 % — ABNORMAL HIGH (ref 11.5–15.5)
WBC: 6.3 10*3/uL (ref 4.0–10.5)
WBC: 9.2 10*3/uL (ref 4.0–10.5)
nRBC: 0 % (ref 0.0–0.2)
nRBC: 0 % (ref 0.0–0.2)

## 2022-04-05 LAB — PREPARE FRESH FROZEN PLASMA
Unit division: 0
Unit division: 0
Unit division: 0

## 2022-04-05 LAB — BPAM CRYOPRECIPITATE
Blood Product Expiration Date: 202401122323
ISSUE DATE / TIME: 202401121947
Unit Type and Rh: 5100

## 2022-04-05 LAB — GLUCOSE, CAPILLARY
Glucose-Capillary: 125 mg/dL — ABNORMAL HIGH (ref 70–99)
Glucose-Capillary: 104 mg/dL — ABNORMAL HIGH (ref 70–99)
Glucose-Capillary: 108 mg/dL — ABNORMAL HIGH (ref 70–99)
Glucose-Capillary: 108 mg/dL — ABNORMAL HIGH (ref 70–99)
Glucose-Capillary: 106 mg/dL — ABNORMAL HIGH (ref 70–99)
Glucose-Capillary: 140 mg/dL — ABNORMAL HIGH (ref 70–99)
Glucose-Capillary: 120 mg/dL — ABNORMAL HIGH (ref 70–99)
Glucose-Capillary: 147 mg/dL — ABNORMAL HIGH (ref 70–99)
Glucose-Capillary: 140 mg/dL — ABNORMAL HIGH (ref 70–99)
Glucose-Capillary: 212 mg/dL — ABNORMAL HIGH (ref 70–99)
Glucose-Capillary: 223 mg/dL — ABNORMAL HIGH (ref 70–99)
Glucose-Capillary: 186 mg/dL — ABNORMAL HIGH (ref 70–99)

## 2022-04-05 LAB — BPAM FFP
Blood Product Expiration Date: 202401152359
Blood Product Expiration Date: 202401152359
Blood Product Expiration Date: 202401122359
ISSUE DATE / TIME: 202401121313
ISSUE DATE / TIME: 202401121313
ISSUE DATE / TIME: 202401121947
Unit Type and Rh: 6200
Unit Type and Rh: 6200
Unit Type and Rh: 6200

## 2022-04-05 LAB — BASIC METABOLIC PANEL
Anion gap: 7 (ref 5–15)
Anion gap: 9 (ref 5–15)
BUN: 8 mg/dL (ref 6–20)
BUN: 12 mg/dL (ref 6–20)
CO2: 25 mmol/L (ref 22–32)
CO2: 23 mmol/L (ref 22–32)
Calcium: 7.3 mg/dL — ABNORMAL LOW (ref 8.9–10.3)
Calcium: 7.6 mg/dL — ABNORMAL LOW (ref 8.9–10.3)
Chloride: 105 mmol/L (ref 98–111)
Chloride: 98 mmol/L (ref 98–111)
Creatinine, Ser: 0.65 mg/dL (ref 0.61–1.24)
Creatinine, Ser: 0.9 mg/dL (ref 0.61–1.24)
GFR, Estimated: >60 mL/min (ref >60)
GFR, Estimated: >60 mL/min (ref >60)
Glucose, Bld: 107 mg/dL — ABNORMAL HIGH (ref 70–99)
Glucose, Bld: 244 mg/dL — ABNORMAL HIGH (ref 70–99)
Potassium: 3.8 mmol/L (ref 3.5–5.1)
Potassium: 3.8 mmol/L (ref 3.5–5.1)
Sodium: 137 mmol/L (ref 135–145)
Sodium: 130 mmol/L — ABNORMAL LOW (ref 135–145)

## 2022-04-05 LAB — BPAM PLATELET PHERESIS
Blood Product Expiration Date: 202401152359
ISSUE DATE / TIME: 202401121356
Unit Type and Rh: 6200

## 2022-04-05 LAB — PREPARE CRYOPRECIPITATE: Unit division: 0

## 2022-04-05 LAB — POCT I-STAT 7, (LYTES, BLD GAS, ICA,H+H)
Acid-Base Excess: 0 mmol/L (ref 0.0–2.0)
Bicarbonate: 25.5 mmol/L (ref 20.0–28.0)
Calcium, Ion: 1.11 mmol/L — ABNORMAL LOW (ref 1.15–1.40)
HCT: 25 % — ABNORMAL LOW (ref 39.0–52.0)
Hemoglobin: 8.5 g/dL — ABNORMAL LOW (ref 13.0–17.0)
O2 Saturation: 99 %
Patient temperature: 37.2
Potassium: 4 mmol/L (ref 3.5–5.1)
Sodium: 139 mmol/L (ref 135–145)
TCO2: 27 mmol/L (ref 22–32)
pCO2 arterial: 47.7 mmHg (ref 32–48)
pH, Arterial: 7.336 — ABNORMAL LOW (ref 7.35–7.45)
pO2, Arterial: 153 mmHg — ABNORMAL HIGH (ref 83–108)

## 2022-04-05 LAB — HEMOGLOBIN AND HEMATOCRIT, BLOOD
HCT: 23.9 % — ABNORMAL LOW (ref 39.0–52.0)
Hemoglobin: 8.5 g/dL — ABNORMAL LOW (ref 13.0–17.0)

## 2022-04-05 LAB — MAGNESIUM
Magnesium: 2 mg/dL (ref 1.7–2.4)
Magnesium: 2.1 mg/dL (ref 1.7–2.4)

## 2022-04-05 LAB — PREPARE RBC (CROSSMATCH)

## 2022-04-05 LAB — PREPARE PLATELET PHERESIS: Unit division: 0

## 2022-04-05 MED ORDER — FUROSEMIDE 10 MG/ML IJ SOLN
40.0000 mg | Freq: Once | INTRAMUSCULAR | Status: AC
  Administered 2022-04-05: 40 mg via INTRAVENOUS
  Filled 2022-04-05: qty 4

## 2022-04-05 MED ORDER — LIDOCAINE 5 % EX PTCH
2.0000 | MEDICATED_PATCH | CUTANEOUS | Status: DC
  Administered 2022-04-05 – 2022-04-08 (×4): 2 via TRANSDERMAL
  Filled 2022-04-05 (×4): qty 2

## 2022-04-05 MED ORDER — GABAPENTIN 100 MG PO CAPS
100.0000 mg | ORAL_CAPSULE | Freq: Two times a day (BID) | ORAL | Status: DC
  Administered 2022-04-05 – 2022-04-09 (×9): 100 mg via ORAL
  Filled 2022-04-05 (×9): qty 1

## 2022-04-05 MED ORDER — ENOXAPARIN SODIUM 40 MG/0.4ML IJ SOSY
40.0000 mg | PREFILLED_SYRINGE | Freq: Every day | INTRAMUSCULAR | Status: DC
  Administered 2022-04-05 – 2022-04-08 (×4): 40 mg via SUBCUTANEOUS
  Filled 2022-04-05 (×4): qty 0.4

## 2022-04-05 MED ORDER — SODIUM CHLORIDE 0.9% IV SOLUTION: Freq: Once | INTRAVENOUS | Status: DC

## 2022-04-05 MED ORDER — INSULIN ASPART 100 UNIT/ML IJ SOLN
0.0000 [IU] | INTRAMUSCULAR | Status: DC
  Administered 2022-04-05: 4 [IU] via SUBCUTANEOUS
  Administered 2022-04-05 – 2022-04-06 (×3): 8 [IU] via SUBCUTANEOUS
  Administered 2022-04-06 (×2): 2 [IU] via SUBCUTANEOUS
  Administered 2022-04-06: 8 [IU] via SUBCUTANEOUS

## 2022-04-05 MED ORDER — INSULIN GLARGINE-YFGN 100 UNIT/ML ~~LOC~~ SOLN
6.0000 [IU] | Freq: Every day | SUBCUTANEOUS | Status: DC
  Administered 2022-04-05 – 2022-04-09 (×5): 6 [IU] via SUBCUTANEOUS
  Filled 2022-04-05 (×5): qty 0.06

## 2022-04-05 NOTE — Progress Notes (Signed)
Cardiac wean initiated at this time

## 2022-04-05 NOTE — Anesthesia Postprocedure Evaluation (Signed)
Anesthesia Post Note  Patient: Danny [name]  Procedure(s) Performed: CORONARY ARTERY BYPASS GRAFTING (CABG) X4 USING LEFT INTERNAL MAMMARY ARTERY AND ENDOSCOPIC GREATER SAPHENOUS VEIN HARVEST ON THE LEFT LEG (Chest) TRANSESOPHAGEAL ECHOCARDIOGRAM (TEE)     Patient location during evaluation: SICU Anesthesia Type: General Level of consciousness: sedated Pain management: pain level controlled Vital Signs Assessment: post-procedure vital signs reviewed and stable Respiratory status: patient remains intubated per anesthesia plan Cardiovascular status: stable Postop Assessment: no apparent nausea or vomiting Anesthetic complications: no   No notable events documented.  Last Vitals:  Vitals:   04/05/22 1100 04/05/22 1105  BP:    Pulse: 80   Resp: (!) 0   Temp:  37.3 C  SpO2: 100%     Last Pain:  Vitals:   04/05/22 1113  TempSrc:   PainSc: 5                  Lasya Vetter P Jolly Bleicher

## 2022-04-05 NOTE — Progress Notes (Signed)
[address]       [place] [address]             [phone]      1 Day Post-Op  Procedure(s) (LRB): CORONARY ARTERY BYPASS GRAFTING (CABG) X4 USING LEFT INTERNAL MAMMARY ARTERY AND ENDOSCOPIC GREATER SAPHENOUS VEIN HARVEST ON THE LEFT LEG (N/A) TRANSESOPHAGEAL ECHOCARDIOGRAM (TEE) (N/A)  Total Length of Stay:  LOS: 1 day   SUBJECTIVE: Had remained intubated overnight This am awake and weaning vent No further issues  Vitals:   04/05/22 0743 04/05/22 0800  BP:    Pulse: 90   Resp: 13   Temp: 99.3 F (37.4 C)   SpO2: 100% 100%    Intake/Output      01/12 0701 01/13 0700 01/13 0701 01/14 0700   I.V. (mL/kg) 5552.7 (66.9) 59.8 (0.7)   Blood 2631    IV Piggyback 1604.6    Total Intake(mL/kg) 9788.2 (117.9) 59.8 (0.7)   Urine (mL/kg/hr) 3545 (1.8) 60 (0.6)   Drains 70    Blood 800    Chest Tube 569 100   Total Output 4984 160   Net +4804.2 -100.2            sodium chloride 10 mL/hr at 04/05/22 0800   sodium chloride     sodium chloride 10 mL/hr at 04/05/22 0800    ceFAZolin (ANCEF) IV Stopped (04/05/22 0539)   dexmedetomidine (PRECEDEX) IV infusion Stopped (04/04/22 1748)   insulin 1.6 Units/hr (04/05/22 0800)   lactated ringers     lactated ringers Stopped (04/05/22 0735)   lactated ringers 20 mL/hr at 04/05/22 0800   nitroGLYCERIN Stopped (04/04/22 1440)   norepinephrine (LEVOPHED) Adult infusion 1 mcg/min (04/05/22 0800)   phenylephrine (NEO-SYNEPHRINE) Adult infusion Stopped (04/05/22 0005)    CBC    Component Value Date/Time   WBC 6.3 04/05/2022 0230   RBC 2.29 (L) 04/05/2022 0230   HGB 8.5 (L) 04/05/2022 0648   HGB 14.7 02/19/2022 1154   HCT 23.9 (L) 04/05/2022 0648   HCT 43.5 02/19/2022 1154   PLT 97 (L) 04/05/2022 0230   PLT 226 02/19/2022 1154   MCV 87.8 04/05/2022 0230   MCV 92 02/19/2022 1154   MCH 30.1 04/05/2022 0230   MCHC 34.3 04/05/2022 0230   RDW 15.9 (H) 04/05/2022 0230   RDW 12.2 02/19/2022 1154    LYMPHSABS 2.3 02/19/2022 1154   MONOABS 0.6 05/11/2012 1015   EOSABS 0.3 02/19/2022 1154   BASOSABS 0.0 02/19/2022 1154   CMP     Component Value Date/Time   NA 137 04/05/2022 0230   NA 141 02/19/2022 1154   K 3.8 04/05/2022 0230   CL 105 04/05/2022 0230   CO2 25 04/05/2022 0230   GLUCOSE 107 (H) 04/05/2022 0230   BUN 8 04/05/2022 0230   BUN 12 02/19/2022 1154   CREATININE 0.65 04/05/2022 0230   CALCIUM 7.3 (L) 04/05/2022 0230   PROT 6.5 03/26/2022 1106   PROT 6.8 02/19/2022 1154   ALBUMIN 4.0 03/26/2022 1106   ALBUMIN 4.5 02/19/2022 1154   AST 21 03/26/2022 1106   ALT 20 03/26/2022 1106   ALKPHOS 66 03/26/2022 1106   BILITOT 1.0 03/26/2022 1106   BILITOT 0.5 02/19/2022 1154   GFRNONAA >60 04/05/2022 0230   GFRAA 119 03/31/2018 0816   ABG    Component Value Date/Time   PHART 7.244 (L) 04/04/2022 1850   PCO2ART 54.0 (H) 04/04/2022 1850   PO2ART 148 (H) 04/04/2022 1850  HCO3 23.3 04/04/2022 1850   TCO2 25 04/04/2022 1850   ACIDBASEDEF 4.0 (H) 04/04/2022 1850   O2SAT 99 04/04/2022 1850   CBG (last 3)  Recent Labs    04/05/22 0602 04/05/22 0650 04/05/22 0800  GLUCAP 140* 147* 120*  EXAM: Lungs: clear Card: rr Abd: soft Ext: warm Neuro:intact   ASSESSMENT: POD #! Sp CABG Doing well hemodynamically On low dose levo but weaning. Will DC swan. Has very good CI Resp: extubate this am Start Diuresis today Leave CT CXR without issues    Coralie Common, MD 04/05/2022

## 2022-04-05 NOTE — Procedures (Signed)
Extubation Procedure Note  Patient Details:   Name: Danny [name] DOB: [birth year] MRN: [id number]   Airway Documentation:    Vent end date: 04/05/22 Vent end time: 0838   Evaluation  O2 sats: stable throughout Complications: No apparent complications Patient did tolerate procedure well. Bilateral Breath Sounds: Clear, Diminished   Yes, pt able to cough to clear secretions and vocalize name. Pt on 3L humidified nasal cannula and tolerating well at this time.  Virgilio Frees 04/05/2022, 8:41 AM

## 2022-04-05 NOTE — Consult Note (Signed)
NAME:  Danny [name], MRN:  [id number], DOB:  11-01-1965, LOS: 1 ADMISSION DATE:  04/04/2022, CONSULTATION DATE:  04/04/22  REFERRING MD:  Tenny Craw , CHIEF COMPLAINT:  s/p CABG x4   History of Present Illness:  57 yo M PMH multivessel CAD with prior stents, HTN, HLD, DM2  who presented 04/04/22 for planned CABG x 4 LIMA - LAD, SVG - OM, SVG - PDA, SVG - PLA. Only unusual aspect of case was some hypoglycemia early in the case req small amount of D50. Otherwise unremarkable course    cross clamp time- 113 min Total pump time- 138 min   EBL 800 Received 480 cellsaver and 2 FFP   PCCM is consulted postoperatively in this setting   Pertinent  Medical History  Multivessel CAD DM HTN HLD   Significant Hospital Events: Including procedures, antibiotic start and stop dates in addition to other pertinent events   1/12 CABG x 4 (LIMA - LAD, SVG - OM2, SVG - PDA, SVG-PLA) planned admit to ICU post op.  Postoperative bleeding corrected by administration of platelets and FFP.  Failed initial ventilator weaning trial. 1/13 successful extubation.  Interim History / Subjective:  Weaned down to minimal vasopressor support.  Following commands with good pain control.  Acceptable lung mechanics.  Objective   Blood pressure (!) 87/62, pulse 80, temperature 99.2 F (37.3 C), temperature source Oral, resp. rate (!) 0, height 5\' 6"  (1.676 m), weight 83 kg, SpO2 100 %. PAP: (21-36)/(6-16) 22/6 CO:  [3.7 L/min-6.8 L/min] 6.8 L/min CI:  [2.1 L/min/m2-3.7 L/min/m2] 3.7 L/min/m2  Vent Mode: PSV;CPAP FiO2 (%):  [40 %-50 %] 40 % Set Rate:  [4 bmp-16 bmp] 4 bmp Vt Set:  [510 mL] 510 mL PEEP:  [5 cmH20] 5 cmH20 Pressure Support:  [5 cmH20-10 cmH20] 5 cmH20 Plateau Pressure:  [12 cmH20-16 cmH20] 12 cmH20   Intake/Output Summary (Last 24 hours) at 04/05/2022 1154 Last data filed at 04/05/2022 1151 Gross per 24 hour  Intake 8979.17 ml  Output 4439 ml  Net 4540.17 ml    Filed Weights   04/04/22 0603  04/05/22 0500  Weight: 72.6 kg 83 kg    Examination: General: Critically ill middle aged M intubated sedated NAD HENT: NCAT pink mm ETT secure  Lungs: CTAb mechanically ventilated.  Passed SBT screen.  Postextubation splinting with deep breathing. Cardiovascular: rrr, paced. Chest tube with bloody output which has slowed down. Abdomen: soft nd hypoactive  Extremities: no acute joint deformity. LLE wrapped  Neuro: sedated and chemically paralyzed  GU: foley with yellow urine   Ancillary tests personally reviewed  Acceptable ABG Creatinine normal at 0.65 Hemoglobin stable 8.5. X-ray shows left lower lobe atelectasis. Assessment & Plan:   57 yo M underwent CABG x4 04/04/22. PCCM is consulted POD0 for post op management.  Multivessel CAD s/p CABG x4  Unstable angina  Post bypass vasoplegic syndrome  Post operative ventilator management, expected  Post op anemia, expected Thrombocytopenia, consumptive, expected  Coagulopathy, consumptive, expected  DM2 Hx HTN HLD   -Extubate today. -Multimodal pain control, adding gabapentin and lidocaine patches.  Continue round-the-clock acetaminophen.  Prioritize oral narcotics if necessary over IV. -Incentive spirometry, up to chair. -Wean norepinephrine to keep MAP greater than 65.  Now off. -Progressive ambulation starting tomorrow. -Start secondary cardiac prevention: ASA, beta-blocker, resume home lipid-lowering regimen -Transition off of IV insulin infusion. -Hold diuresis till tomorrow.  Best Practice (right click and "Reselect all SmartList Selections" daily)   Diet/type: Advance diet as tolerated  DVT prophylaxis: CDs.  Start Lovenox today GI prophylaxis: H2B and PPI Lines: Keep central line.  DC Swan-Ganz catheter DC arterial line Foley:  Yes, and it is still needed Code Status:  full code Last date of multidisciplinary goals of care discussion [--]  CRITICAL CARE Performed by: Kipp Brood   Total critical care time: 35  minutes  Critical care time was exclusive of separately billable procedures and treating other patients. Critical care was necessary to treat or prevent imminent or life-threatening deterioration.  Critical care was time spent personally by me on the following activities: development of treatment plan with patient and/or surrogate as well as nursing, discussions with consultants, evaluation of patient's response to treatment, examination of patient, obtaining history from patient or surrogate, ordering and performing treatments and interventions, ordering and review of laboratory studies, ordering and review of radiographic studies, pulse oximetry and re-evaluation of patient's condition.  Kipp Brood, MD [facility] ICU Physician [place]  Pager: [phone] Or Epic Secure Chat After hours: [phone].  04/05/2022, 12:09 PM     04/05/2022, 11:54 AM

## 2022-04-06 ENCOUNTER — Inpatient Hospital Stay (HOSPITAL_COMMUNITY): Payer: Managed Care, Other (non HMO)

## 2022-04-06 LAB — CBC
HCT: 22.2 % — ABNORMAL LOW (ref 39.0–52.0)
Hemoglobin: 7.7 g/dL — ABNORMAL LOW (ref 13.0–17.0)
MCH: 30.6 pg (ref 26.0–34.0)
MCHC: 34.7 g/dL (ref 30.0–36.0)
MCV: 88.1 fL (ref 80.0–100.0)
Platelets: 89 10*3/uL — ABNORMAL LOW (ref 150–400)
RBC: 2.52 MIL/uL — ABNORMAL LOW (ref 4.22–5.81)
RDW: 15.9 % — ABNORMAL HIGH (ref 11.5–15.5)
WBC: 8.1 10*3/uL (ref 4.0–10.5)
nRBC: 0 % (ref 0.0–0.2)

## 2022-04-06 LAB — BASIC METABOLIC PANEL
Anion gap: 7 (ref 5–15)
BUN: 19 mg/dL (ref 6–20)
CO2: 26 mmol/L (ref 22–32)
Calcium: 7.5 mg/dL — ABNORMAL LOW (ref 8.9–10.3)
Chloride: 100 mmol/L (ref 98–111)
Creatinine, Ser: 0.95 mg/dL (ref 0.61–1.24)
GFR, Estimated: >60 mL/min (ref >60)
Glucose, Bld: 131 mg/dL — ABNORMAL HIGH (ref 70–99)
Potassium: 3.7 mmol/L (ref 3.5–5.1)
Sodium: 133 mmol/L — ABNORMAL LOW (ref 135–145)

## 2022-04-06 LAB — GLUCOSE, CAPILLARY
Glucose-Capillary: 116 mg/dL — ABNORMAL HIGH (ref 70–99)
Glucose-Capillary: 135 mg/dL — ABNORMAL HIGH (ref 70–99)
Glucose-Capillary: 153 mg/dL — ABNORMAL HIGH (ref 70–99)
Glucose-Capillary: 157 mg/dL — ABNORMAL HIGH (ref 70–99)
Glucose-Capillary: 214 mg/dL — ABNORMAL HIGH (ref 70–99)
Glucose-Capillary: 230 mg/dL — ABNORMAL HIGH (ref 70–99)

## 2022-04-06 MED ORDER — ~~LOC~~ CARDIAC SURGERY, PATIENT & FAMILY EDUCATION: Freq: Once | Status: AC

## 2022-04-06 MED ORDER — SODIUM CHLORIDE 0.9 % IV SOLN: 250.0000 mL | INTRAVENOUS | Status: DC | PRN

## 2022-04-06 MED ORDER — POTASSIUM CHLORIDE CRYS ER 20 MEQ PO TBCR
20.0000 meq | EXTENDED_RELEASE_TABLET | ORAL | Status: AC
  Administered 2022-04-06 (×3): 20 meq via ORAL
  Filled 2022-04-06 (×3): qty 1

## 2022-04-06 MED ORDER — MIDODRINE HCL 5 MG PO TABS
5.0000 mg | ORAL_TABLET | Freq: Three times a day (TID) | ORAL | Status: DC
  Administered 2022-04-06 (×3): 5 mg via ORAL
  Filled 2022-04-06 (×3): qty 1

## 2022-04-06 MED ORDER — SODIUM CHLORIDE 0.9% FLUSH: 3.0000 mL | Freq: Two times a day (BID) | INTRAVENOUS | Status: DC

## 2022-04-06 MED ORDER — INSULIN ASPART 100 UNIT/ML IJ SOLN
0.0000 [IU] | Freq: Three times a day (TID) | INTRAMUSCULAR | Status: DC
  Administered 2022-04-07 (×2): 2 [IU] via SUBCUTANEOUS
  Administered 2022-04-07: 8 [IU] via SUBCUTANEOUS
  Administered 2022-04-08: 12 [IU] via SUBCUTANEOUS
  Administered 2022-04-08: 4 [IU] via SUBCUTANEOUS
  Administered 2022-04-08: 8 [IU] via SUBCUTANEOUS
  Administered 2022-04-09: 4 [IU] via SUBCUTANEOUS

## 2022-04-06 MED ORDER — SODIUM CHLORIDE 0.9% FLUSH: 3.0000 mL | INTRAVENOUS | Status: DC | PRN

## 2022-04-06 NOTE — Progress Notes (Signed)
Pt arrived to rm 13 from [place]. CHG wipe given. Initiated tele. Oriented pt the unit. VSS. Call bell within reach.   Lavenia Atlas, RN

## 2022-04-06 NOTE — Progress Notes (Signed)
[address]       [place] [address]             [phone]                    Danny [name] [facility] Medical Record #[id number] Date of Birth: April 06, 1965  Referring: No ref. provider found Primary Care: Pcp, No Primary Cardiologist: Minus Breeding, MD  OPERATION: CABG x 4 (LIMA - LAD, SVG - OM2, SVG - PDA, SVG-PLA) Endoscopic saphenous vein harvest  Went to an ER apparently, needed BM  Told him additional Iron containing supplements can contribute to this.   I let him know wouldn't expect him to need IMDUR now that he has been revascularized.    PHYSICAL EXAMINATION: There were no vitals taken for this visit.  Gen: NAD Neuro: Alert and oriented Chest: incision healing well Resp: Nonlaboured Abd: Soft, ntnd Extr: WWP, 1+ LE edema bilateral  Diagnostic Studies & Laboratory data:    XR today: Lungs clear  Diaphragm equal   Assessment / Plan:   S/P CAB x 4 on 04/04/22  Doing great s/p CAB x 4  Is on ASA/Plavix Incisions well healed  Can drive in 1 week ( 3 weeks s/p surgery as doing so well) Avoid lifting > 20-25 lb x 6 weeks Expect full recovery at 8 weeks postop  Plan to see back in 3 weeks.     Danny [name] Danny [name] 04/06/2022 4:37 PM

## 2022-04-06 NOTE — Progress Notes (Signed)
[address]       [place] [address]             [phone]      2 Days Post-Op  Procedure(s) (LRB): CORONARY ARTERY BYPASS GRAFTING (CABG) X4 USING LEFT INTERNAL MAMMARY ARTERY AND ENDOSCOPIC GREATER SAPHENOUS VEIN HARVEST ON THE LEFT LEG (N/A) TRANSESOPHAGEAL ECHOCARDIOGRAM (TEE) (N/A)  Total Length of Stay:  LOS: 2 days   SUBJECTIVE: Feels well Slept well  Vitals:   04/06/22 0500 04/06/22 0600  BP: (!) 85/55 (!) 96/57  Pulse: 77 90  Resp: (!) 7 (!) 21  Temp:    SpO2: 90% 95%    Intake/Output      01/13 0701 01/14 0700 01/14 0701 01/15 0700   P.O. 240    I.V. (mL/kg) 426.5 (5.3)    Blood     IV Piggyback 292.6    Total Intake(mL/kg) 959.1 (11.9)    Urine (mL/kg/hr) 1505 (0.8)    Drains 110    Blood     Chest Tube 285    Total Output 1900    Net -940.9             sodium chloride Stopped (04/05/22 0844)   sodium chloride     sodium chloride 10 mL/hr at 04/06/22 0600   insulin Stopped (04/05/22 1243)   lactated ringers     lactated ringers Stopped (04/05/22 0735)   lactated ringers Stopped (04/05/22 1431)   nitroGLYCERIN Stopped (04/04/22 1440)   norepinephrine (LEVOPHED) Adult infusion Stopped (04/05/22 0843)    CBC    Component Value Date/Time   WBC 8.1 04/06/2022 0319   RBC 2.52 (L) 04/06/2022 0319   HGB 7.7 (L) 04/06/2022 0319   HGB 14.7 02/19/2022 1154   HCT 22.2 (L) 04/06/2022 0319   HCT 43.5 02/19/2022 1154   PLT 89 (L) 04/06/2022 0319   PLT 226 02/19/2022 1154   MCV 88.1 04/06/2022 0319   MCV 92 02/19/2022 1154   MCH 30.6 04/06/2022 0319   MCHC 34.7 04/06/2022 0319   RDW 15.9 (H) 04/06/2022 0319   RDW 12.2 02/19/2022 1154   LYMPHSABS 2.3 02/19/2022 1154   MONOABS 0.6 05/11/2012 1015   EOSABS 0.3 02/19/2022 1154   BASOSABS 0.0 02/19/2022 1154   CMP     Component Value Date/Time   NA 133 (L) 04/06/2022 0319   NA 141 02/19/2022 1154   K 3.7 04/06/2022 0319   CL 100 04/06/2022 0319   CO2 26 04/06/2022  0319   GLUCOSE 131 (H) 04/06/2022 0319   BUN 19 04/06/2022 0319   BUN 12 02/19/2022 1154   CREATININE 0.95 04/06/2022 0319   CALCIUM 7.5 (L) 04/06/2022 0319   PROT 6.5 03/26/2022 1106   PROT 6.8 02/19/2022 1154   ALBUMIN 4.0 03/26/2022 1106   ALBUMIN 4.5 02/19/2022 1154   AST 21 03/26/2022 1106   ALT 20 03/26/2022 1106   ALKPHOS 66 03/26/2022 1106   BILITOT 1.0 03/26/2022 1106   BILITOT 0.5 02/19/2022 1154   GFRNONAA >60 04/06/2022 0319   GFRAA 119 03/31/2018 0816   ABG    Component Value Date/Time   PHART 7.336 (L) 04/05/2022 0835   PCO2ART 47.7 04/05/2022 0835   PO2ART 153 (H) 04/05/2022 0835   HCO3 25.5 04/05/2022 0835   TCO2 27 04/05/2022 0835   ACIDBASEDEF 4.0 (H) 04/04/2022 1850   O2SAT 99 04/05/2022 0835   CBG (last 3)  Recent Labs    04/05/22 2014 04/06/22 0008  04/06/22 0319  GLUCAP 186* 116* 135*  EXAM Lungs: clear but dull on left base Card: RR  Incision dressing dry Ext: warm Neuro: alert and oriented   ASSESSMENT: POD #2 Sp CABG Doing well Will transfer to floor Remove larger CT and leave JP DC art line and sleeve CXR with elevated left hemidiaphragm  Coralie Common, MD 04/06/2022

## 2022-04-07 ENCOUNTER — Encounter (HOSPITAL_COMMUNITY): Payer: Self-pay | Admitting: Cardiothoracic Surgery

## 2022-04-07 ENCOUNTER — Inpatient Hospital Stay (HOSPITAL_COMMUNITY): Payer: Managed Care, Other (non HMO)

## 2022-04-07 LAB — BASIC METABOLIC PANEL
Anion gap: 6 (ref 5–15)
BUN: 18 mg/dL (ref 6–20)
CO2: 27 mmol/L (ref 22–32)
Calcium: 8 mg/dL — ABNORMAL LOW (ref 8.9–10.3)
Chloride: 100 mmol/L (ref 98–111)
Creatinine, Ser: 0.99 mg/dL (ref 0.61–1.24)
GFR, Estimated: >60 mL/min (ref >60)
Glucose, Bld: 172 mg/dL — ABNORMAL HIGH (ref 70–99)
Potassium: 4.1 mmol/L (ref 3.5–5.1)
Sodium: 133 mmol/L — ABNORMAL LOW (ref 135–145)

## 2022-04-07 LAB — GLUCOSE, CAPILLARY
Glucose-Capillary: 160 mg/dL — ABNORMAL HIGH (ref 70–99)
Glucose-Capillary: 229 mg/dL — ABNORMAL HIGH (ref 70–99)
Glucose-Capillary: 132 mg/dL — ABNORMAL HIGH (ref 70–99)
Glucose-Capillary: 205 mg/dL — ABNORMAL HIGH (ref 70–99)

## 2022-04-07 LAB — CBC
HCT: 22.9 % — ABNORMAL LOW (ref 39.0–52.0)
Hemoglobin: 7.6 g/dL — ABNORMAL LOW (ref 13.0–17.0)
MCH: 30 pg (ref 26.0–34.0)
MCHC: 33.2 g/dL (ref 30.0–36.0)
MCV: 90.5 fL (ref 80.0–100.0)
Platelets: 105 10*3/uL — ABNORMAL LOW (ref 150–400)
RBC: 2.53 MIL/uL — ABNORMAL LOW (ref 4.22–5.81)
RDW: 14.9 % (ref 11.5–15.5)
WBC: 8.5 10*3/uL (ref 4.0–10.5)
nRBC: 0 % (ref 0.0–0.2)

## 2022-04-07 MED ORDER — ASPIRIN 81 MG PO TBEC
81.0000 mg | DELAYED_RELEASE_TABLET | Freq: Every day | ORAL | Status: DC
  Administered 2022-04-08 – 2022-04-09 (×2): 81 mg via ORAL
  Filled 2022-04-07 (×2): qty 1

## 2022-04-07 MED ORDER — MIDODRINE HCL 5 MG PO TABS
5.0000 mg | ORAL_TABLET | Freq: Two times a day (BID) | ORAL | Status: DC
  Administered 2022-04-07 (×2): 5 mg via ORAL
  Filled 2022-04-07 (×2): qty 1

## 2022-04-07 MED ORDER — ORAL CARE MOUTH RINSE: 15.0000 mL | OROMUCOSAL | Status: DC | PRN

## 2022-04-07 MED ORDER — CLOPIDOGREL BISULFATE 75 MG PO TABS
75.0000 mg | ORAL_TABLET | Freq: Every day | ORAL | Status: DC
  Administered 2022-04-08 – 2022-04-09 (×2): 75 mg via ORAL
  Filled 2022-04-07 (×2): qty 1

## 2022-04-07 NOTE — Progress Notes (Signed)
CARDIAC REHAB PHASE I   PRE:  Rate/Rhythm: 78 SR    BP: sitting 111/69    SaO2: 94 RA  MODE:  Ambulation: 470 ft   POST:  Rate/Rhythm: 94 SR    BP: sitting 121/72     SaO2: 97 RA  Pt feeling well. Moved out of bed independently and ambulated with contact guard in hall. Fatigue toward end, otherwise no c/o. To recliner. Encouraged x2 more walks and IS. Elevated feet as legs are edematous. [place] BS, ACSM-CEP 04/07/2022 9:39 AM

## 2022-04-07 NOTE — Progress Notes (Signed)
TCTS Progress Note:  NAEO  Transferred from ICU   Meds:  ASA: yes, adding plavix for tomorrow - ordered DVT ppx: yes BB: yes   Also - on midodrine BID      Latest Ref Rng & Units 04/07/2022    1:10 AM 04/06/2022    3:19 AM 04/05/2022    4:16 PM  CBC  WBC 4.0 - 10.5 K/uL 8.5  8.1  9.2   Hemoglobin 13.0 - 17.0 g/dL 7.6  7.7  9.1   Hematocrit 39.0 - 52.0 % 22.9  22.2  25.4   Platelets 150 - 400 K/uL 105  89  106        Latest Ref Rng & Units 04/07/2022    1:10 AM 04/06/2022    3:19 AM 04/05/2022    4:16 PM  CMP  Glucose 70 - 99 mg/dL 172  131  244   BUN 6 - 20 mg/dL 18  19  12    Creatinine 0.61 - 1.24 mg/dL 0.99  0.95  0.90   Sodium 135 - 145 mmol/L 133  133  130   Potassium 3.5 - 5.1 mmol/L 4.1  3.7  3.8   Chloride 98 - 111 mmol/L 100  100  98   CO2 22 - 32 mmol/L 27  26  23    Calcium 8.9 - 10.3 mg/dL 8.0  7.5  7.6     ABG    Component Value Date/Time   PHART 7.336 (L) 04/05/2022 0835   PCO2ART 47.7 04/05/2022 0835   PO2ART 153 (H) 04/05/2022 0835   HCO3 25.5 04/05/2022 0835   TCO2 27 04/05/2022 0835   ACIDBASEDEF 4.0 (H) 04/04/2022 1850   O2SAT 99 04/05/2022 0835       42M POD 3 CABG x 4, ho PCI  Likely DC Wed  DC pacing wires  DC drain tomorrow   Will go home on ASA/Plavix  Midodrine to BID today, DC tomorrow  Cr, WC, Hgb WNL

## 2022-04-07 NOTE — Progress Notes (Signed)
D/c pacing wire per order. Pacing wire ends intact. Pt tolerated well. Will continue to monitor the pt.   Lavenia Atlas, RN

## 2022-04-07 NOTE — TOC Initial Note (Signed)
Transition of Care [facility]) - Initial/Assessment Note    Patient Details  Name: Danny [name] MRN: [id number] Date of Birth: 14-Feb-1966  Transition of Care [facility] CM/SW Contact:    Levonne Lapping, RN Phone Number: 04/07/2022, 2:23 PM  Clinical Narrative:         CM met with Patient and his Wife at bedside. Discussed DC plan to home. No recommendations for HH. Wife will transport home at dc. Anticipate no TOC needs but will continue to follow.              Barriers to Discharge: Continued Medical Work up   Patient Goals and CMS Choice Patient states their goals for this hospitalization and ongoing recovery are:: Get home   Choice offered to / list presented to : NA      Expected Discharge Plan and Services In-house Referral: NA Discharge Planning Services: NA Post Acute Care Choice: NA                   DME Arranged: N/A DME Agency: NA       HH Arranged: NA HH Agency: NA        Prior Living Arrangements/Services   Lives with:: Spouse Patient language and need for interpreter reviewed:: Yes Do you feel safe going back to the place where you live?: Yes      Need for Family Participation in Patient Care: No (Comment) Care giver support system in place?: Yes (comment) (Wife) Current home services:  (NONE) Criminal Activity/Legal Involvement Pertinent to Current Situation/Hospitalization: No - Comment as needed  Activities of Daily Living Home Assistive Devices/Equipment: Eyeglasses, CBG Meter ADL Screening (condition at time of admission) Patient's cognitive ability adequate to safely complete daily activities?: Yes Is the patient deaf or have difficulty hearing?: Yes Does the patient have difficulty seeing, even when wearing glasses/contacts?: No Does the patient have difficulty concentrating, remembering, or making decisions?: No Patient able to express need for assistance with ADLs?: Yes Does the patient have difficulty dressing or bathing?: No Independently  performs ADLs?: Yes (appropriate for developmental age) Does the patient have difficulty walking or climbing stairs?: No Weakness of Legs: None Weakness of Arms/Hands: None  Permission Sought/Granted                  Emotional Assessment Appearance:: Appears stated age Attitude/Demeanor/Rapport: Engaged Affect (typically observed): Accepting Orientation: : Oriented to Self, Oriented to Place, Oriented to  Time, Oriented to Situation Alcohol / Substance Use: Not Applicable Psych Involvement: No (comment)  Admission diagnosis:  S/P CABG x 4 [Z95.1] Patient Active Problem List   Diagnosis Date Noted   S/P CABG x 4 04/04/2022   Thrombocytopenia ([place]) 04/04/2022   Coagulopathy ([place]) 04/04/2022   Hypotension after procedure 04/04/2022   Coronary artery disease involving native coronary artery of native heart with refractory angina pectoris ([place]) 02/27/2022   Vitamin D deficiency 02/27/2022   Tinnitus 02/27/2022   Depressive disorder 02/27/2022   Congenital stenosis of cardiac valve 02/27/2022   Abnormal vision 02/27/2022   Stented coronary artery 01/13/2022   Hypertension 01/13/2022   Coronary artery disease involving native coronary artery of native heart with unstable angina pectoris ([place]) 08/19/2017   Autonomic orthostatic hypotension 12/23/2016   Carotid artery disease ([place]) 12/23/2016   Precordial pain 01/01/2016   Tinea pedis of both feet 07/11/2015   Myofascial pain 07/11/2015   Right flank pain, chronic 04/27/2015   CAD S/P percutaneous coronary angioplasty    Unstable angina ([place]) 01/05/2015  Non-proliferative diabetic retinopathy, mild, both eyes ([place]) 05/29/2014   Type 2 diabetes mellitus ([place]) 04/09/2011   Dyslipidemia    Hypertriglyceridemia    Type 2 diabetes mellitus with complication, with long-term current use of insulin ([place])    Obesity 10/26/2007   PCP:  Pcp, No Pharmacy:   [id number] [place], [place] [address] [place] [address] Phone: [phone] Fax: [phone]     Social Determinants of Health (SDOH) Social History: SDOH Screenings   Food Insecurity: No Food Insecurity (04/06/2022)  Housing: Low Risk  (04/06/2022)  Transportation Needs: No Transportation Needs (04/06/2022)  Utilities: Not At Risk (04/06/2022)  Tobacco Use: Low Risk  (04/04/2022)   SDOH Interventions:     Readmission Risk Interventions     No data to display

## 2022-04-08 LAB — TYPE AND SCREEN
ABO/RH(D): O POS
Antibody Screen: NEGATIVE
Unit division: 0
Unit division: 0
Unit division: 0
Unit division: 0

## 2022-04-08 LAB — GLUCOSE, CAPILLARY
Glucose-Capillary: 234 mg/dL — ABNORMAL HIGH (ref 70–99)
Glucose-Capillary: 166 mg/dL — ABNORMAL HIGH (ref 70–99)
Glucose-Capillary: 292 mg/dL — ABNORMAL HIGH (ref 70–99)
Glucose-Capillary: 246 mg/dL — ABNORMAL HIGH (ref 70–99)

## 2022-04-08 LAB — BPAM RBC
Blood Product Expiration Date: 202401192359
Blood Product Expiration Date: 202402012359
Blood Product Expiration Date: 202402012359
Blood Product Expiration Date: 202402012359
ISSUE DATE / TIME: 202401120954
ISSUE DATE / TIME: 202401121947
ISSUE DATE / TIME: 202401130341
Unit Type and Rh: 5100
Unit Type and Rh: 5100
Unit Type and Rh: 5100
Unit Type and Rh: 5100

## 2022-04-08 NOTE — Plan of Care (Signed)
Plan of care reviewed. Pt has been progressing.   Problem: Activity: Goal: Ability to return to baseline activity level will improve Outcome: Progressing: ambulated x 3 in hall way with standby assisted. Pt had well tolerated to ambulation with no distress.    Problem: Cardiovascular: S/P CABG x 4 Goal: Ability to achieve and maintain adequate cardiovascular perfusion will improve Outcome: Progressing: NSR on the monitor. His hemodynamics remains table. No acute distress noted. Goal: Vascular access site(s) Level 0-1 will be maintained Outcome: Progressing: left and right leg venous harvesting incisions are clean and dry, no hematoma or drainage.  Goal: Will remain free from infection Outcome: Progressing: Pt remains afebrile. Sternal wound is coverred with silver hydro-fiber dressing. Plan to remove at am, after POD 3. No signs of wound infection. JP drain has minimal serosanguinous drainage.  Goal: Respiratory complications will improve Outcome: Progressing: on room air, SPO2 100%. Mild crackles on left and right lung base. Patchy opacity in the MEDIAL LEFT lung base is noted from CXR on 04/07/22. Frequent breathing exercise with incentive spirometer was encouraged.    Kennyth Lose, RN

## 2022-04-08 NOTE — Discharge Summary (Addendum)
Physician Discharge Summary  Patient ID: Danny [name] MRN: [id number] DOB/AGE: [birth year] 57 y.o.  Admit date: 04/04/2022 Discharge date: 04/09/2022  Admission Diagnoses:  Coronary artery disease History of multiple percutaneous coronary interventions Type 2 diabetes mellitus dyslipidemia GERD  Discharge Diagnoses:   Coronary artery disease Angina pectoris S/P CABG x 4 History of multiple percutaneous coronary interventions Type 2 diabetes mellitus dyslipidemia GERD Expected acute blood loss anemia Thrombocytopenia (HCC) Coagulopathy (HCC) Hypotension after procedure  Discharged Condition: stable  History of Present Illness:  Danny [name] is a 57 yo male with history of DM, Dyslipidemia, Hypertriglyceridemia, and CAD.  His CAD began in 2006 at which time he underwent PCI to distal RCA.  He did well until 2014 at which time this stent was re-stenosed.  He underwent repeat PCI procedure at that time with overlapping stent placement.  He has been cathed in 2016 and 2019 during which additional stenting to his RCA was performed.  He was hospitalized in 2020 with unstable angina.  He was again catheter and had repeat PCI at that time.  He was also started on Ranexa.  The patient presented to Cardiology in 2020 [facility] for a second opinion.  He was evaluated by Dr. Percival Spanish at which time patient continued to complain of anginal symptoms.  This typically would occur with walking up an incline but mainly now occurs after eating a meal.  He is limited activity wise due to his angina symptoms and he would like to be able to be more active.  It was felt repeat cardiac catheterization would be indicated.  This was performed by Dr. Angelena Form in December and showed severe 3 vessel CAD.  It was felt coronary bypass grafting would be the best treatment option for the patient and he was referred for outpatient evaluation.  He was evaluated by Dr. Tenny Craw who felt coronary bypass grafting would be  beneficial for this patient.  The risks and benefits of the procedure were explained to the patient and he was agreeable to proceed.  Hospital Course:  Danny [name] presented to [facility] on 04/04/2022.  He was taken to the operating room and underwent CABG x 4 utilizing LIMA to LAD, SVG to PDA, SVG to PLA, and SVG to OM.  He also underwent endoscopic harvest of greater saphenous vein from his left leg.  He tolerated the procedure without difficulty and was taken to the SICU in stable condition.  Vital signs, hemodynamics, and cardiac rhythm remained stable.  He was weaned from the ventilator and extubated using standard protocols by 8am on the day following surgery.  Low-dose Levophed was weaned on the first postoperative day.  Monitoring lines were removed.  He was diuresed as blood pressure allows.  2 large-bore pleural tubes were removed on the second postoperative day.  A JP drain was left in place.  Chest x-ray showed elevation of the left hemidiaphragm.  His respiratory status remained stable.  He was easily weaned off of the supplemental oxygen.  He was transferred to 4E progressive care on postop day 2.  His activity and diet were advanced and well-tolerated.  Temporary pacer wires were removed on postop day 3 followed by removal of the mediastinal JP drain.  He had return of appropriate bowel function.  The sternotomy incision and bilateral lower extremity EVH incisions were healing with no evidence of complication by the time of discharge.  The cardiac rhythm remained stable.   pulmonary/intensive care  Significant Diagnostic Studies:   CLINICAL DATA:  Status post CABG.   EXAM: CHEST - 2 VIEW   COMPARISON:  04/06/2022   FINDINGS: RIGHT IJ sheath has been removed. There is stable elevation of the LEFT hemidiaphragm. Patchy opacity in the MEDIAL LEFT lung base is again noted, similar to prior studies. RIGHT lung is clear. No pulmonary edema.   IMPRESSION: 1. Stable  appearance of LEFT basilar opacity. 2. Stable elevation of LEFT hemidiaphragm.     Electronically Signed   By: Norva Pavlov M.D.   On: 04/07/2022 07:25  Treatments: Surgery    OPERATIVE NOTE: Patient Name: Danny [name] Date of Birth: Mar 01, 1966 Date of Operation: 04/04/21   PRE-OPERATIVE DIAGNOSIS: Coronary artery disease History of multiple PCI DM2 HLD GERD   POST-OPERATIVE DIAGNOSIS: Same   OPERATION: CABG x 4 (LIMA - LAD, SVG - OM2, SVG - PDA, SVG-PLA) Endoscopic saphenous vein harvest   SURGEON: Waverly Ferrari Enter MD   ASSISTANT: Lowella Dandy PA   EBL 100cc   FINDINGS: EF normal before surgery EF normal after surgery   Adequate conduits and targets LAD 2.0 mm, bridged a significant lesion with anastomosis OM2 mm, 1.75 flow 40 cc/min PDA mm, 2.0 flow 35 cc/min PLA 1.33mm, flow 40cc/min   TIMES: XC:  113 min CPB: 138 min   SPECIMENS None   COMPLICATIONS: None     Discharge Exam: Blood pressure 117/70, pulse 97, temperature 98.9 F (37.2 C), temperature source Oral, resp. rate 19, height 5\' 6"  (1.676 m), weight 74.5 kg, SpO2 97 %.  General appearance: alert, cooperative, and no distress Resp: breath sounds are clear, normal work of breathing on RA with stable O2 ssats Chest wall: the sternotomy incision is well approximated and dry, covered with Dermabond. Cardio: RRR, no arrhythmias Extremities: well perfused, bilateral LE  EVH incisions are intact and dry. Expected bruising in both thighs.  Neurologic: Grossly normal  Disposition: Discharged to home in stable condition.  Discharge Instructions     Amb Referral to Cardiac Rehabilitation   Complete by: As directed    [facility]   Diagnosis: CABG   CABG X ___: 4   After initial evaluation and assessments completed: Virtual Based Care may be provided alone or in conjunction with Phase 2 Cardiac Rehab based on patient barriers.: Yes   Intensive Cardiac Rehabilitation (ICR) [facility] location  only OR Traditional Cardiac Rehabilitation (TCR) *If criteria for ICR are not met will enroll in TCR [facility] only): Yes      Allergies as of 04/09/2022       Reactions   Crestor [rosuvastatin]    MUSCLE PAIN   Ampicillin Nausea And Vomiting   DID THE REACTION INVOLVE: Swelling of the face/tongue/throat, SOB, or low BP? No Sudden or severe rash/hives, skin peeling, or the inside of the mouth or nose? No Did it require medical treatment? No When did it last happen?      childhood allergy  If all above answers are "NO", may proceed with cephalosporin use.   Penicillins    DID THE REACTION INVOLVE: Swelling of the face/tongue/throat, SOB, or low BP? Unknown Sudden or severe rash/hives, skin peeling, or the inside of the mouth or nose? Unknown Did it require medical treatment? Unknown When did it last happen?      childhood allergy If all above answers are "NO", may proceed with cephalosporin use.        Medication List     TAKE these medications    acetaminophen 325 MG tablet Commonly known as: TYLENOL Take  2 tablets (650 mg total) by mouth every 4 (four) hours as needed.   ARTIFICIAL TEAR SOLUTION OP Place 1 drop into both eyes daily as needed (dry eyes).   aspirin EC 81 MG tablet Take 1 tablet (81 mg total) by mouth daily.   atorvastatin 80 MG tablet Commonly known as: LIPITOR TAKE 1 TABLET BY MOUTH DAILY   clopidogrel 75 MG tablet Commonly known as: PLAVIX Take 1 tablet (75 mg total) by mouth daily.   escitalopram 10 MG tablet Commonly known as: LEXAPRO Take 10 mg by mouth daily.   Farxiga 10 MG Tabs tablet Generic drug: dapagliflozin propanediol Take 10 mg by mouth daily.   Fe Fum-Vit C-Vit B12-FA Caps capsule Commonly known as: TRIGELS-F FORTE Take 1 capsule by mouth 2 (two) times daily.   fenofibrate 145 MG tablet Commonly known as: TRICOR Take 145 mg by mouth daily.   FreeStyle Libre 14 Day Sensor Misc SMARTSIG:1 Topical Every 2 Weeks   icosapent  Ethyl 1 g capsule Commonly known as: VASCEPA Take 2 capsules (2 g total) by mouth 2 (two) times daily.   isosorbide mononitrate 60 MG 24 hr tablet Commonly known as: IMDUR Take 1 tablet (60 mg total) by mouth daily.   ketoconazole 2 % cream Commonly known as: NIZORAL Apply 1 Application topically daily as needed for irritation.   metFORMIN 1000 MG tablet Commonly known as: GLUCOPHAGE Take 1,000 mg by mouth 2 (two) times daily with a meal.   metoprolol succinate 25 MG 24 hr tablet Commonly known as: Toprol XL Take 1 tablet (25 mg total) by mouth daily.   nitroGLYCERIN 0.4 MG SL tablet Commonly known as: Nitrostat PLACE 1 TABLET (0.4 MG TOTAL) UNDER THE TONGUE EVERY 5 (FIVE) MINUTES AS NEEDED FOR CHEST PAIN.   ranolazine 1000 MG SR tablet Commonly known as: RANEXA Take 1,000 mg by mouth 2 (two) times daily.   traMADol 50 MG tablet Commonly known as: ULTRAM Take 1 tablet (50 mg total) by mouth every 6 (six) hours as needed for up to 7 days for moderate pain.   Evaristo Bury FlexTouch 200 UNIT/ML FlexTouch Pen Generic drug: insulin degludec Inject 44 Units into the skin daily.   Vitamin D (Ergocalciferol) 1.25 MG (50000 UNIT) Caps capsule Commonly known as: DRISDOL Take 50,000 Units by mouth every 7 (seven) days.        Follow-up Information     Enter, Waverly Ferrari, MD. Go on 04/17/2022.   Specialties: Cardiothoracic Surgery, Cardiology Why: Your appointment is at 11:30am. Please obtain a chest x-ray at [facility] located at [facility] [address] 1 hour prior to the appointment. Contact information: [address] [place] [address] [phone]         Ronney Asters, NP. Go on 04/28/2022.   Specialty: Cardiology Why: Your appointment is at 10:05am. Contact information: [address] [place] [address] [phone]         [facility] Follow up.   Why: Chest x-ray 1 hour prior to the office visit with Dr.  Salvatore Marvel information: [address] [place] [address]                The patient has been discharged on:   1.Beta Blocker:  Yes [  x ]                              No   [   ]  If No, reason:  2.Ace Inhibitor/ARB: Yes [   ]                                     No  [  x  ]                                     If No, reason: BP soft, can be added as outpatient when appropriate.  3.Statin:   Yes [ x  ]                  No  [   ]                  If No, reason:  4.Ecasa:  Yes  [ x  ]                  No   [   ]                  If No, reason:  5. ACS on Admission?  Yes  P2Y12 Inhibitor:  Yes  [  x ]                                No  [  ]    Signed: Antony Odea, PA-C 04/09/2022, 8:19 AM

## 2022-04-08 NOTE — Progress Notes (Deleted)
Test note - EPIC

## 2022-04-08 NOTE — Discharge Instructions (Signed)

## 2022-04-08 NOTE — Progress Notes (Signed)
JP drain removed as per order,patient tolerated well,site clean and dry.will continue to monitor.

## 2022-04-08 NOTE — Progress Notes (Signed)
CARDIAC REHAB PHASE I   PRE:  Rate/Rhythm: 80 NSR  BP:  Sitting: 97/57      SaO2: 98 RA  MODE:  Ambulation: 470 ft   AD:  None  POST:  Rate/Rhythm: 96 NSR  BP:  Sitting: 108/68      SaO2: 97 RA  Pt amb with supervision assistance, pt denies CP and SOB during amb and was returned to room w/o complaint.  Pt walking independently, doing very well.  Pt was educated on restrictions, Move in the Tube sheet, heart healthy and diabetic diet, sternal precautions, ex guidelines, IS use when d/c, and CRPII. Pt will be referred to [place]  12:31 PM 04/08/2022    Service time is from 1115 to 1200.

## 2022-04-08 NOTE — Progress Notes (Signed)
TCTS Progress Note:  NAEO  Meds: ASA - yes and plavix on today too DVT ppx: yes BB: yes  Today's Vitals   04/07/22 2351 04/08/22 0043 04/08/22 0325 04/08/22 0630  BP: 113/66  109/77   Pulse: 79  79   Resp: 20  18   Temp: 98.6 F (37 C)  98.4 F (36.9 C)   TempSrc: Oral  Oral   SpO2: 99%  100%   Weight:   78 kg   Height:      PainSc: 6  Asleep  3    Body mass index is 27.75 kg/m.     Latest Ref Rng & Units 04/07/2022    1:10 AM 04/06/2022    3:19 AM 04/05/2022    4:16 PM  CBC  WBC 4.0 - 10.5 K/uL 8.5  8.1  9.2   Hemoglobin 13.0 - 17.0 g/dL 7.6  7.7  9.1   Hematocrit 39.0 - 52.0 % 22.9  22.2  25.4   Platelets 150 - 400 K/uL 105  89  106        Latest Ref Rng & Units 04/07/2022    1:10 AM 04/06/2022    3:19 AM 04/05/2022    4:16 PM  CMP  Glucose 70 - 99 mg/dL 172  131  244   BUN 6 - 20 mg/dL 18  19  12    Creatinine 0.61 - 1.24 mg/dL 0.99  0.95  0.90   Sodium 135 - 145 mmol/L 133  133  130   Potassium 3.5 - 5.1 mmol/L 4.1  3.7  3.8   Chloride 98 - 111 mmol/L 100  100  98   CO2 22 - 32 mmol/L 27  26  23    Calcium 8.9 - 10.3 mg/dL 8.0  7.5  7.6     ABG    Component Value Date/Time   PHART 7.336 (L) 04/05/2022 0835   PCO2ART 47.7 04/05/2022 0835   PO2ART 153 (H) 04/05/2022 0835   HCO3 25.5 04/05/2022 0835   TCO2 27 04/05/2022 0835   ACIDBASEDEF 4.0 (H) 04/04/2022 1850   O2SAT 99 04/05/2022 0835   18M POD 4 CABG x 4, ho PCI   Likely DC Wed   DC drain today   Will go home on ASA/Plavix   DC midodrine    Cr, WC, Hgb WNL

## 2022-04-08 NOTE — Progress Notes (Signed)
Insuline was administered to patient and needle stuck this RN  in index finger after administration,blood borne pathogen protocol started,patient made aware,all questions were answered.PA  and [facility] notified ,safety zone completed

## 2022-04-09 ENCOUNTER — Other Ambulatory Visit (HOSPITAL_COMMUNITY): Payer: Self-pay

## 2022-04-09 LAB — GLUCOSE, CAPILLARY: Glucose-Capillary: 192 mg/dL — ABNORMAL HIGH (ref 70–99)

## 2022-04-09 MED ORDER — METOPROLOL SUCCINATE ER 25 MG PO TB24
25.0000 mg | ORAL_TABLET | Freq: Every day | ORAL | 5 refills | Status: AC
  Filled 2022-04-09: qty 30, 30d supply, fill #0

## 2022-04-09 MED ORDER — FE FUM-VIT C-VIT B12-FA 460-60-0.01-1 MG PO CAPS
1.0000 | ORAL_CAPSULE | Freq: Two times a day (BID) | ORAL | 0 refills | Status: DC
  Filled 2022-04-09: qty 60, 30d supply, fill #0

## 2022-04-09 MED ORDER — ACETAMINOPHEN 325 MG PO TABS: 650.0000 mg | ORAL_TABLET | ORAL | Status: AC | PRN

## 2022-04-09 MED ORDER — TRAMADOL HCL 50 MG PO TABS
50.0000 mg | ORAL_TABLET | Freq: Four times a day (QID) | ORAL | 0 refills | Status: AC | PRN
  Filled 2022-04-09: qty 28, 7d supply, fill #0

## 2022-04-09 MED ORDER — CLOPIDOGREL BISULFATE 75 MG PO TABS
75.0000 mg | ORAL_TABLET | Freq: Every day | ORAL | 3 refills | Status: AC
  Filled 2022-04-09: qty 90, 90d supply, fill #0

## 2022-04-09 NOTE — Progress Notes (Addendum)
[address]       [place] [address]             [phone]      5 Days Post-Op Procedure(s) (LRB): CORONARY ARTERY BYPASS GRAFTING (CABG) X4 USING LEFT INTERNAL MAMMARY ARTERY AND ENDOSCOPIC GREATER SAPHENOUS VEIN HARVEST ON THE LEFT LEG (N/A) TRANSESOPHAGEAL ECHOCARDIOGRAM (TEE) (N/A) Subjective: Awake and alert, says he feels well and has no concerns. He would like to return home.   Objective: Vital signs in last 24 hours: Temp:  [97.8 F (36.6 C)-98.9 F (37.2 C)] 98.9 F (37.2 C) (01/17 0721) Pulse Rate:  [75-97] 97 (01/17 0721) Cardiac Rhythm: Normal sinus rhythm (01/16 2222) Resp:  [17-20] 19 (01/17 0721) BP: (91-148)/(65-80) 117/70 (01/17 0721) SpO2:  [92 %-99 %] 97 % (01/17 0721) Weight:  [74.5 kg] 74.5 kg (01/17 0500)     Intake/Output from previous day: 01/16 0701 - 01/17 0700 In: 860 [P.O.:860] Out: 5 [Drains:5] Intake/Output this shift: No intake/output data recorded.  General appearance: alert, cooperative, and no distress Resp: breath sounds are clear, normal work of breathing on RA with stable O2 ssats Chest wall: the sternotomy incision is well approximated and dry, covered with Dermabond. Cardio: RRR, no arrhythmias Extremities: well perfused, bilateral LE  EVH incisions are intact and dry. Expected bruising in both thighs.  Neurologic: Grossly normal  Lab Results: Recent Labs    04/07/22 0110  WBC 8.5  HGB 7.6*  HCT 22.9*  PLT 105*   BMET:  Recent Labs    04/07/22 0110  NA 133*  K 4.1  CL 100  CO2 27  GLUCOSE 172*  BUN 18  CREATININE 0.99  CALCIUM 8.0*    PT/INR: No results for input(s): "LABPROT", "INR" in the last 72 hours. ABG    Component Value Date/Time   PHART 7.336 (L) 04/05/2022 0835   HCO3 25.5 04/05/2022 0835   TCO2 27 04/05/2022 0835   ACIDBASEDEF 4.0 (H) 04/04/2022 1850   O2SAT 99 04/05/2022 0835   CBG (last 3)  Recent Labs    04/08/22 1640 04/08/22 2050 04/09/22 0622  GLUCAP 292*  246* 192*    Assessment/Plan: S/P Procedure(s) (LRB): CORONARY ARTERY BYPASS GRAFTING (CABG) X4 USING LEFT INTERNAL MAMMARY ARTERY AND ENDOSCOPIC GREATER SAPHENOUS VEIN HARVEST ON THE LEFT LEG (N/A) TRANSESOPHAGEAL ECHOCARDIOGRAM (TEE) (N/A)  -POD5 CABG x 4 for MVCAD previously managed with multiple PCI's. Preserved ventricular function. Stable VS and cardiac rhythm. Now independent with ambulation and transfers. Continue ASA, atorvastatin, metoprolol (Toprol at discharge), and Plavix.  -Type 2 DM- Hyperglycemic past 24 hours as oral intake has improved. He will resume his usual regimen at discharge with metformin, Farxiga, and Tresiba.   -Expected acute blood loss anemia- tolerating OK. Discharging on Fe supplement x 1 month.   -PULM- O2 sat stable on RA.   -Disposition-Discharge to home today. Instructions given. Follow up in the office in 1 week with CXR.    LOS: 5 days    Antony Odea, PA-C 04/09/2022

## 2022-04-09 NOTE — Progress Notes (Signed)
CARDIAC REHAB PHASE I   Reviewed ed with pt and answered questions. Has good reception. [place]coe BS, ACSM-CEP 04/09/2022 8:49 AM

## 2022-04-09 NOTE — Progress Notes (Signed)
Plan of care reviewed. Pt has been progressing. Pt is able to sleep well tonight. He is hemodynamically stable, NSR on the monitor, afebrile, SPO2 98-99% on room air, no acute distress noted over night. Sternal incision is dry and clean, no drainage. Left and right leg venous harvesting incisions are clean and dry.  Kennyth Lose, RN

## 2022-04-09 NOTE — TOC Transition Note (Addendum)
Transition of Care [facility]) - CM/SW Discharge Note   Patient Details  Name: Danny [name] MRN: [id number] Date of Birth: 1966-01-22  [facility] CM/SW Contact:  Levonne Lapping, RN Phone Number: 04/09/2022, 10:01 AM   Clinical Narrative:     Patient discharging home with Wife, today.  Patient will follow up as instructed. No additional TOC needs     Barriers to Discharge: Continued Medical Work up   Patient Goals and CMS Choice   Choice offered to / list presented to : NA  Discharge Placement                         Discharge Plan and Services Additional resources added to the After Visit Summary for   In-house Referral: NA Discharge Planning Services: NA Post Acute Care Choice: NA          DME Arranged: N/A DME Agency: NA       HH Arranged: NA HH Agency: NA        Social Determinants of Health (SDOH) Interventions SDOH Screenings   Food Insecurity: No Food Insecurity (04/06/2022)  Housing: Low Risk  (04/06/2022)  Transportation Needs: No Transportation Needs (04/06/2022)  Utilities: Not At Risk (04/06/2022)  Tobacco Use: Low Risk  (04/07/2022)     Readmission Risk Interventions     No data to display

## 2022-04-16 ENCOUNTER — Other Ambulatory Visit: Payer: Self-pay | Admitting: Cardiothoracic Surgery

## 2022-04-16 DIAGNOSIS — Z951 Presence of aortocoronary bypass graft: Secondary | ICD-10-CM

## 2022-04-16 MED FILL — Lidocaine HCl Local Preservative Free (PF) Inj 2%: INTRAMUSCULAR | Qty: 14 | Status: AC

## 2022-04-16 MED FILL — Sodium Bicarbonate IV Soln 8.4%: INTRAVENOUS | Qty: 50 | Status: AC

## 2022-04-16 MED FILL — Heparin Sodium (Porcine) Inj 1000 Unit/ML: INTRAMUSCULAR | Qty: 20 | Status: AC

## 2022-04-16 MED FILL — Electrolyte-R (PH 7.4) Solution: INTRAVENOUS | Qty: 4000 | Status: AC

## 2022-04-16 MED FILL — Albumin, Human Inj 5%: INTRAVENOUS | Qty: 250 | Status: AC

## 2022-04-16 MED FILL — Sodium Chloride IV Soln 0.9%: INTRAVENOUS | Qty: 3000 | Status: AC

## 2022-04-16 MED FILL — Mannitol IV Soln 20%: INTRAVENOUS | Qty: 500 | Status: AC

## 2022-04-16 MED FILL — Calcium Chloride Inj 10%: INTRAVENOUS | Qty: 10 | Status: AC

## 2022-04-16 MED FILL — Potassium Chloride Inj 2 mEq/ML: INTRAVENOUS | Qty: 40 | Status: AC

## 2022-04-16 MED FILL — Heparin Sodium (Porcine) Inj 1000 Unit/ML: Qty: 1000 | Status: AC

## 2022-04-17 ENCOUNTER — Ambulatory Visit
Admission: RE | Admit: 2022-04-17 | Discharge: 2022-04-17 | Disposition: A | Discharge status: Home / Self Care | Payer: Managed Care, Other (non HMO) | Source: Ambulatory Visit | Attending: Cardiothoracic Surgery | Admitting: Cardiothoracic Surgery

## 2022-04-17 ENCOUNTER — Ambulatory Visit (INDEPENDENT_AMBULATORY_CARE_PROVIDER_SITE_OTHER): Payer: Self-pay | Admitting: Cardiothoracic Surgery

## 2022-04-17 VITALS — BP 89/59 | HR 83 | Resp 20 | Ht 63.0 in | Wt 159.0 lb

## 2022-04-17 DIAGNOSIS — Z951 Presence of aortocoronary bypass graft: Secondary | ICD-10-CM

## 2022-04-17 DIAGNOSIS — I2510 Atherosclerotic heart disease of native coronary artery without angina pectoris: Secondary | ICD-10-CM

## 2022-04-22 ENCOUNTER — Telehealth: Payer: Self-pay

## 2022-04-22 NOTE — Telephone Encounter (Signed)
STD form completed and faxed to [facility] @ [phone]. Beginning LOA 04/04/22 through 06/02/22 per Dr Tenny Craw.  DOS 04/04/22, s/p CABG

## 2022-04-24 NOTE — Progress Notes (Unsigned)
Cardiology Clinic Note   Patient Name: Danny [name] Date of Encounter: 04/28/2022  Primary Care Provider:  Pcp, No Primary Cardiologist:  [name], MD  Patient Profile    Danny [name] is a 57 y.o. male with a past medical history of CAD s/p CABG x 4 2024 (previous multiple PCI's), carotid artery disease, hypertension, hyperlipidemia, T2DM who presents to the clinic today for hospital follow-up after CABG.  Past Medical History    Past Medical History:  Diagnosis Date   CAD (coronary artery disease)    COSTAR study DES Stent to RCA 2006;   LHC  05/19/12: oLM 20%, LAD 40-50%, pD1 50%, mCFX 30-40%, mRCA stent with severe ISR and 70% beyond the stent, dRCA 50%, pPDA 50%, pPLA 30-40%.  EF 55-65%. =>  PCI:  Overlapping Promus DES x2 to the mid RCA ISR. [place] s/p DES to LCx  c.95% circ DES 10/16,  [place]    CAD in native artery, hx of multiple PCIs 08/19/2017   DM2 (diabetes mellitus, type 2) (HCC)    GERD (gastroesophageal reflux disease)    Headache    HLD (hyperlipidemia)    Obesity    Past Surgical History:  Procedure Laterality Date   CARDIAC CATHETERIZATION N/A 01/05/2015   Procedure: Left Heart Cath and Coronary Angiography;  Surgeon: Peter M Swaziland, MD;  Location: [facility] INVASIVE CV LAB;  Service: Cardiovascular;  Laterality: N/A;   CARDIAC CATHETERIZATION  01/05/2015   Procedure: Coronary Stent Intervention;  Surgeon: Peter M Swaziland, MD;  Location: [facility] INVASIVE CV LAB;  Service: Cardiovascular;;   COLONOSCOPY     CORONARY ANGIOPLASTY WITH STENT PLACEMENT  05/19/2012   DES   to RCA   by Dr Excell Seltzer   CORONARY ARTERY BYPASS GRAFT N/A 04/04/2022   Procedure: CORONARY ARTERY BYPASS GRAFTING (CABG) X4 USING LEFT INTERNAL MAMMARY ARTERY AND ENDOSCOPIC GREATER SAPHENOUS VEIN HARVEST ON THE LEFT LEG;  Surgeon: Lyn Hollingshead, MD;  Location: [facility] OR;  Service: Open Heart Surgery;  Laterality: N/A;   CORONARY BALLOON ANGIOPLASTY N/A 04/02/2018   Procedure: CORONARY BALLOON ANGIOPLASTY;   Surgeon: Swaziland, Peter M, MD;  Location: [facility] INVASIVE CV LAB;  Service: Cardiovascular;  Laterality: N/A;   CORONARY STENT INTERVENTION N/A 08/18/2017   Procedure: CORONARY STENT INTERVENTION;  Surgeon: Marykay Lex, MD;  Location: [facility] INVASIVE CV LAB;  Service: Cardiovascular;  Laterality: N/A;   CORONARY STENT PLACEMENT  01/05/2015   mid cx  des   INTRAVASCULAR ULTRASOUND/IVUS N/A 04/02/2018   Procedure: Intravascular Ultrasound/IVUS;  Surgeon: Swaziland, Peter M, MD;  Location: [facility] INVASIVE CV LAB;  Service: Cardiovascular;  Laterality: N/A;   LEFT HEART CATH AND CORONARY ANGIOGRAPHY N/A 08/18/2017   Procedure: LEFT HEART CATH AND CORONARY ANGIOGRAPHY;  Surgeon: Marykay Lex, MD;  Location: [facility] INVASIVE CV LAB;  Service: Cardiovascular;  Laterality: N/A;   LEFT HEART CATH AND CORONARY ANGIOGRAPHY N/A 04/02/2018   Procedure: LEFT HEART CATH AND CORONARY ANGIOGRAPHY;  Surgeon: Swaziland, Peter M, MD;  Location: [facility] INVASIVE CV LAB;  Service: Cardiovascular;  Laterality: N/A;   LEFT HEART CATH AND CORONARY ANGIOGRAPHY N/A 02/27/2022   Procedure: LEFT HEART CATH AND CORONARY ANGIOGRAPHY;  Surgeon: Kathleene Hazel, MD;  Location: [facility] INVASIVE CV LAB;  Service: Cardiovascular;  Laterality: N/A;   PERCUTANEOUS CORONARY STENT INTERVENTION (PCI-S) N/A 05/19/2012   Procedure: PERCUTANEOUS CORONARY STENT INTERVENTION (PCI-S);  Surgeon: Tonny Bollman, MD;  Location: [facility] CATH LAB;  Service: Cardiovascular;  Laterality: N/A;   TEE WITHOUT CARDIOVERSION  N/A 04/04/2022   Procedure: TRANSESOPHAGEAL ECHOCARDIOGRAM (TEE);  Surgeon: Lyn Hollingshead, MD;  Location: [facility] OR;  Service: Open Heart Surgery;  Laterality: N/A;    Allergies  Allergies  Allergen Reactions   Crestor [Rosuvastatin]     MUSCLE PAIN   Ampicillin Nausea And Vomiting    DID THE REACTION INVOLVE: Swelling of the face/tongue/throat, SOB, or low BP? No Sudden or severe rash/hives, skin peeling, or the inside of the mouth or nose? No Did  it require medical treatment? No When did it last happen?      childhood allergy  If all above answers are "NO", may proceed with cephalosporin use.    Penicillins     DID THE REACTION INVOLVE: Swelling of the face/tongue/throat, SOB, or low BP? Unknown Sudden or severe rash/hives, skin peeling, or the inside of the mouth or nose? Unknown Did it require medical treatment? Unknown When did it last happen?      childhood allergy If all above answers are "NO", may proceed with cephalosporin use.     History of Present Illness    Danny [name] has a past medical history of: CAD. LHC 04/08/2004: Mid RCA 80%.  PCI with DES 5 x 24 mm to mid RCA. LHC 05/19/2012: LAD with diffuse stenosis extending beyond D2 40 to 50%.  Proximal D1 50%.  Mid LCx 30 to 40%.  Severe in-stent restenosis mid RCA with 70% stenosis beyond the stent.  PDA moderate diffuse throughout the proximal aspect 50%.  PLA mild diffuse throughout proximal aspect 30 to 40%.  Successful PTCA and PCI of the RCA with overlapping DES. LHC 10/14/206: Proximal to mid RCA 30%.  Proximal RCA (site of previous DES) 40%.  Distal RCA #1 50%, distal RCA #2 30%.  Proximal to mid LAD 25%. D1 25%.  Proximal to mid Cx 30%.  Mid Cx 95%.  Successful PCI with DES to mid LCx. LHC 08/18/2017: PCI with DES to distal RCA #2.  Moderate proximal to mid RCA in-stent restenosis. LHC 04/02/2018: Distal RCA with in-stent restenosis.  Successful PCI with balloon angioplasty to distal RCA stent. LHC 07/11/2021 performed at [facility]: Severe CAD with CTO LCx stents into OM2.  Unable to cross with a wire.  PTCA mid LCx stent.  Diffuse in-stent restenosis RCA stent. LHC 02/27/2022: Severe three-vessel CAD.  Severe disease proximal and mid LAD, OM1.  CTO of stented segment of OM 2 with collateral filling.  Moderately severe restenosis proximal and mid RCA stented segment.  Severe restenosis distal RCA stented segment.  Referred to CTS. Echo 02/27/2022: EF 60 to  65%. CABG x 4 04/04/2022: LIMA to LAD, SVG to OM 2, SVG to PDA, SVG to PLA. Carotid artery disease. Carotid ultrasound 02/19/2022: Bilateral ICA 40 to 59%.  Suggest repeat study in 12 months. Hypertension. Hyperlipidemia. Lipid panel 01/17/2021: LDL 47, HDL 34, TG 140, total 105. T2DM.  Mr. Mikesell is a longtime patient of cardiology for the above outlined history.  He was last seen in the office by Dr. Antoine Poche on 02/19/2022 for exertional chest pain.  LHC was ordered (as above) to get better pictures for potential CABG.  Patient underwent CABG x 4 on 04/04/2022.  He followed up with Dr. Delia Chimes on 04/17/2022 with reports of doing well with no complaints.  Today, patient is doing remarkably well. He denies shortness of breath or dyspnea on exertion. No chest pain, pressure, or tightness. Denies lower extremity edema, orthopnea, or PND. No palpitations.  He is  avoiding heavy lifting but he did some yard work over the weekend and he is starting some light hiking without chest pain or dyspnea.  He reports some mild tenderness to his sternal incision.  He denies any swelling, erythema, or warmth of his leg.  His plan is to transfer his care back up to [facility] eventually. He is going to look into cardiac rehab and his local area.  He will contact the office with the details of where he can go so referral can be made.  He reports Dr. Tenny Craw stopped isosorbide and questions if he can stop Ranexa as well.  Discussed obtaining labs today.  It appears Dr. Percival Spanish wanted to draw an LPa on patient but it was never done.  Will get that today.   Home Medications    Current Meds  Medication Sig   acetaminophen (TYLENOL) 325 MG tablet Take 2 tablets (650 mg total) by mouth every 4 (four) hours as needed.   ARTIFICIAL TEAR SOLUTION OP Place 1 drop into both eyes daily as needed (dry eyes).   aspirin EC 81 MG EC tablet Take 1 tablet (81 mg total) by mouth daily.   atorvastatin (LIPITOR) 80 MG tablet TAKE 1  TABLET BY MOUTH DAILY   clopidogrel (PLAVIX) 75 MG tablet Take 1 tablet (75 mg total) by mouth daily.   Continuous Blood Gluc Sensor (FREESTYLE LIBRE 14 DAY SENSOR) MISC SMARTSIG:1 Topical Every 2 Weeks   escitalopram (LEXAPRO) 10 MG tablet Take 10 mg by mouth daily.   FARXIGA 10 MG TABS tablet Take 10 mg by mouth daily.   fenofibrate (TRICOR) 145 MG tablet Take 145 mg by mouth daily.   icosapent Ethyl (VASCEPA) 1 g capsule Take 2 capsules (2 g total) by mouth 2 (two) times daily.   ketoconazole (NIZORAL) 2 % cream Apply 1 Application topically daily as needed for irritation.   metFORMIN (GLUCOPHAGE) 1000 MG tablet Take 1,000 mg by mouth 2 (two) times daily with a meal.   metoprolol succinate (TOPROL XL) 25 MG 24 hr tablet Take 1 tablet (25 mg total) by mouth daily.   nitroGLYCERIN (NITROSTAT) 0.4 MG SL tablet PLACE 1 TABLET (0.4 MG TOTAL) UNDER THE TONGUE EVERY 5 (FIVE) MINUTES AS NEEDED FOR CHEST PAIN.   ranolazine (RANEXA) 1000 MG SR tablet Take 1,000 mg by mouth 2 (two) times daily.   TRESIBA FLEXTOUCH 200 UNIT/ML SOPN Inject 44 Units into the skin daily.   Vitamin D, Ergocalciferol, (DRISDOL) 1.25 MG (50000 UNIT) CAPS capsule Take 50,000 Units by mouth every 7 (seven) days.    Family History    Family History  Problem Relation Age of Onset   Diabetes Mother    Coronary artery disease Father 38   Heart attack Father    He indicated that his mother is alive. He indicated that his father is alive. He indicated that his maternal grandmother is alive. He indicated that his maternal grandfather is alive. He indicated that his paternal grandmother is alive. He indicated that his paternal grandfather is alive.   Social History    Social History   Socioeconomic History   Marital status: Married    Spouse name: Not on file   Number of children: Not on file   Years of education: Not on file   Highest education level: Not on file  Occupational History   Not on file  Tobacco Use    Smoking status: Never   Smokeless tobacco: Never  Vaping Use   Vaping Use:  Never used  Substance and Sexual Activity   Alcohol use: Yes    Alcohol/week: 7.0 standard drinks of alcohol    Types: 7 Cans of beer per week    Comment: 1-2 beers every other day   Drug use: No   Sexual activity: Not on file  Other Topics Concern   Not on file  Social History Narrative   Not on file   Social Determinants of Health   Financial Resource Strain: Not on file  Food Insecurity: No Food Insecurity (04/06/2022)   Hunger Vital Sign    Worried About Running Out of Food in the Last Year: Never true    Ran Out of Food in the Last Year: Never true  Transportation Needs: No Transportation Needs (04/06/2022)   PRAPARE - Hydrologist (Medical): No    Lack of Transportation (Non-Medical): No  Physical Activity: Not on file  Stress: Not on file  Social Connections: Not on file  Intimate Partner Violence: Not At Risk (04/06/2022)   Humiliation, Afraid, Rape, and Kick questionnaire    Fear of Current or Ex-Partner: No    Emotionally Abused: No    Physically Abused: No    Sexually Abused: No     Review of Systems    General:  No chills, fever, night sweats or weight changes.  Cardiovascular:  No chest pain, dyspnea on exertion, edema, orthopnea, palpitations, paroxysmal nocturnal dyspnea. Dermatological: No rash, lesions/masses Respiratory: No cough, dyspnea Urologic: No hematuria, dysuria Abdominal:   No nausea, vomiting, diarrhea, bright red blood per rectum, melena, or hematemesis Neurologic:  No visual changes, weakness, changes in mental status. All other systems reviewed and are otherwise negative except as noted above.  Physical Exam    VS:  BP 104/68 (BP Location: Left Arm, Patient Position: Sitting, Cuff Size: Normal)   Pulse 68   Resp 12   Ht 5\' 6"  (1.676 m)   Wt 162 lb 3.2 oz (73.6 kg)   SpO2 99%   BMI 26.18 kg/m  , BMI Body mass index is 26.18  kg/m. GEN:  Well nourished, well developed, in no acute distress. HEENT: Normal. Neck: Supple, no JVD, carotid bruits, or masses. Cardiac: RRR, no murmurs, rubs, or gallops. No clubbing, cyanosis, edema.  Radials/DP/PT 2+ and equal bilaterally.  Respiratory:  Respirations regular and unlabored, clear to auscultation bilaterally. GI: Soft, nontender, nondistended. MS: No deformity or atrophy. Skin: Warm and dry, no rash.  Sternal incision well approximated with some scabbing at the distal end.  No drainage, erythema, edema, or warmth. Neuro: Strength and sensation are intact. Psych: Normal affect.  Accessory Clinical Findings     Recent Labs: 03/26/2022: ALT 20 04/05/2022: Magnesium 2.1 04/07/2022: BUN 18; Creatinine, Ser 0.99; Hemoglobin 7.6; Platelets 105; Potassium 4.1; Sodium 133   Recent Lipid Panel    Component Value Date/Time   CHOL 87 (L) 09/28/2018 0815   TRIG 157 (H) 09/28/2018 0815   HDL 28 (L) 09/28/2018 0815   CHOLHDL 3.1 09/28/2018 0815   CHOLHDL 7.4 08/16/2017 0320   VLDL UNABLE TO CALCULATE IF TRIGLYCERIDE OVER 400 mg/dL 08/16/2017 0320   LDLCALC 28 09/28/2018 0815   LDLDIRECT 38 09/28/2018 0815   LDLDIRECT 57.7 08/25/2012 0951    ECG not indicated today.  Assessment & Plan   CAD.  History of multiple PCIs.  CABG x 27 March 2022.  Patient denies chest pain, pressure, or tightness.  He is starting to return to normal activities.  He did  some yard work and light hiking over the weekend without any chest pain or dyspnea.  Dr. Tenny Craw stopped isosorbide at patient's follow-up visit.  He would also like to stop Ranexa and is told he can do so.  Continue aspirin, Plavix, Lipitor, metoprolol, as needed SL NTG. Carotid artery disease.  Ultrasound November 2023 showed bilateral ICA 40 to 59%.  Patient denies dizziness, lightheadedness, presyncope, or syncope.  Continue aspirin, Plavix, Lipitor.  Repeat ultrasound recommended for November 2024. Hypertension.  BP today 104/68.   Patient denies headaches or dizziness. Continue metoprolol. Hyperlipidemia.  LDL October 2022 47.  Dr. Percival Spanish requested patient have lipid panel and LPa in October 2023.  It does not appear those tests are ever collected.  Patient ate breakfast this morning so we will obtain direct LDL and LPa along with CMP, and CBC.  Disposition: Direct LDL, LPa, CMP, CBC.  Patient will contact the office if he is able to attend cardiac rehab in his local area so a referral can be sent.  Return in 3 months or sooner as needed.   Justice Britain. Kayle Passarelli, DNP, NP-C     04/28/2022, 9:54 AM [address] Office [phone] Fax [phone]

## 2022-04-28 ENCOUNTER — Encounter: Payer: Self-pay | Admitting: General Practice

## 2022-04-28 ENCOUNTER — Ambulatory Visit: Payer: Managed Care, Other (non HMO) | Attending: General Practice | Admitting: Student

## 2022-04-28 VITALS — BP 104/68 | HR 68 | Resp 12 | Ht 66.0 in | Wt 162.2 lb

## 2022-04-28 DIAGNOSIS — I2510 Atherosclerotic heart disease of native coronary artery without angina pectoris: Secondary | ICD-10-CM

## 2022-04-28 DIAGNOSIS — I6523 Occlusion and stenosis of bilateral carotid arteries: Secondary | ICD-10-CM | POA: Diagnosis not present

## 2022-04-28 DIAGNOSIS — E785 Hyperlipidemia, unspecified: Secondary | ICD-10-CM

## 2022-04-28 DIAGNOSIS — Z951 Presence of aortocoronary bypass graft: Secondary | ICD-10-CM | POA: Diagnosis not present

## 2022-04-28 DIAGNOSIS — I10 Essential (primary) hypertension: Secondary | ICD-10-CM

## 2022-04-28 NOTE — Patient Instructions (Signed)
Medication Instructions:  Your physician has recommended you make the following change in your medication:   -Stop taking ranolazine (ranexa)   *If you need a refill on your cardiac medications before your next appointment, please call your pharmacy*   Lab Work: Your physician recommends that you have labs drawn today: CMET, CBC, LDL, LPA  If you have labs (blood work) drawn today and your tests are completely normal, you will receive your results only by: [place] (if you have MyChart) OR A paper copy in the mail If you have any lab test that is abnormal or we need to change your treatment, we will call you to review the results.   Follow-Up: At [facility], you and your health needs are our priority.  As part of our continuing mission to provide you with exceptional heart care, we have created designated Provider Care Teams.  These Care Teams include your primary Cardiologist (physician) and Advanced Practice Providers (APPs -  Physician Assistants and Nurse Practitioners) who all work together to provide you with the care you need, when you need it.  We recommend signing up for the patient portal called "MyChart".  Sign up information is provided on this After Visit Summary.  MyChart is used to connect with patients for Virtual Visits (Telemedicine).  Patients are able to view lab/test results, encounter notes, upcoming appointments, etc.  Non-urgent messages can be sent to your provider as well.   To learn more about what you can do with MyChart, go to [email or web address].    Your next appointment:   3 month(s)  Provider:   Minus Breeding, MD  or APP

## 2022-04-30 LAB — CBC
Hematocrit: 38.8 % (ref 37.5–51.0)
Hemoglobin: 12.8 g/dL — ABNORMAL LOW (ref 13.0–17.7)
MCH: 30.3 pg (ref 26.6–33.0)
MCHC: 33 g/dL (ref 31.5–35.7)
MCV: 92 fL (ref 79–97)
Platelets: 317 10*3/uL (ref 150–450)
RBC: 4.22 x10E6/uL (ref 4.14–5.80)
RDW: 13.3 % (ref 11.6–15.4)
WBC: 8.8 10*3/uL (ref 3.4–10.8)

## 2022-04-30 LAB — LDL CHOLESTEROL, DIRECT: LDL Direct: 60 mg/dL (ref 0–99)

## 2022-04-30 LAB — COMPREHENSIVE METABOLIC PANEL
ALT: 9 IU/L (ref 0–44)
AST: 14 IU/L (ref 0–40)
Albumin/Globulin Ratio: 1.7 (ref 1.2–2.2)
Albumin: 4.4 g/dL (ref 3.8–4.9)
Alkaline Phosphatase: 101 IU/L (ref 44–121)
BUN/Creatinine Ratio: 24 — ABNORMAL HIGH (ref 9–20)
BUN: 24 mg/dL (ref 6–24)
Bilirubin Total: 0.3 mg/dL (ref 0.0–1.2)
CO2: 23 mmol/L (ref 20–29)
Calcium: 9.8 mg/dL (ref 8.7–10.2)
Chloride: 102 mmol/L (ref 96–106)
Creatinine, Ser: 0.98 mg/dL (ref 0.76–1.27)
Globulin, Total: 2.6 g/dL (ref 1.5–4.5)
Glucose: 163 mg/dL — ABNORMAL HIGH (ref 70–99)
Potassium: 5 mmol/L (ref 3.5–5.2)
Sodium: 139 mmol/L (ref 134–144)
Total Protein: 7 g/dL (ref 6.0–8.5)
eGFR: 91 mL/min/{1.73_m2} (ref >59)

## 2022-04-30 LAB — LIPOPROTEIN A (LPA): Lipoprotein (a): 26.7 nmol/L (ref <75.0)

## 2022-05-02 NOTE — Progress Notes (Unsigned)
[address]       [place] [address]             [phone]   HPI: This is a 57 year old male with a past medical history of coronary artery disease (S/p PCI, stents), diabetes mellitus, hypertension, hyperlipidemia, obesity, GERD who underwent a CABG x 4 (LIMA to LAD, SVG to OM2, SVG to PDA, SVG to PLB) by Dr. Tenny Craw on 04/04/2022.   Current Outpatient Medications  Medication Sig Dispense Refill   acetaminophen (TYLENOL) 325 MG tablet Take 2 tablets (650 mg total) by mouth every 4 (four) hours as needed.     ARTIFICIAL TEAR SOLUTION OP Place 1 drop into both eyes daily as needed (dry eyes).     aspirin EC 81 MG EC tablet Take 1 tablet (81 mg total) by mouth daily.     atorvastatin (LIPITOR) 80 MG tablet TAKE 1 TABLET BY MOUTH DAILY 90 tablet 3   clopidogrel (PLAVIX) 75 MG tablet Take 1 tablet (75 mg total) by mouth daily. 90 tablet 3   Continuous Blood Gluc Sensor (FREESTYLE LIBRE 14 DAY SENSOR) MISC SMARTSIG:1 Topical Every 2 Weeks     escitalopram (LEXAPRO) 10 MG tablet Take 10 mg by mouth daily.     FARXIGA 10 MG TABS tablet Take 10 mg by mouth daily.     fenofibrate (TRICOR) 145 MG tablet Take 145 mg by mouth daily.     icosapent Ethyl (VASCEPA) 1 g capsule Take 2 capsules (2 g total) by mouth 2 (two) times daily. 360 capsule 3   ketoconazole (NIZORAL) 2 % cream Apply 1 Application topically daily as needed for irritation.     metFORMIN (GLUCOPHAGE) 1000 MG tablet Take 1,000 mg by mouth 2 (two) times daily with a meal.     metoprolol succinate (TOPROL XL) 25 MG 24 hr tablet Take 1 tablet (25 mg total) by mouth daily. 30 tablet 5   nitroGLYCERIN (NITROSTAT) 0.4 MG SL tablet PLACE 1 TABLET (0.4 MG TOTAL) UNDER THE TONGUE EVERY 5 (FIVE) MINUTES AS NEEDED FOR CHEST PAIN. 25 tablet 2   ranolazine (RANEXA) 1000 MG SR tablet Take 1,000 mg by mouth 2 (two) times daily.     TRESIBA FLEXTOUCH 200 UNIT/ML SOPN Inject 44 Units into the skin daily.  5   Vitamin D,  Ergocalciferol, (DRISDOL) 1.25 MG (50000 UNIT) CAPS capsule Take 50,000 Units by mouth every 7 (seven) days.    Vital Signs:   Physical Exam: CV Pulmonary\A Abdomen Extremities Wounds  Diagnostic Tests: ***  Impression and Plan: You are encouraged to enroll and participate in the outpatient cardiac rehab program beginning as soon as practical. You may return to driving an automobile as long as you are no longer requiring oral narcotic pain relievers during the daytime.  It would be wise to start driving only short distances during the daylight and gradually increase from there as you feel comfortable. Make every effort to keep your diabetes under very tight control.  Follow up closely with your primary care physician or endocrinologist and strive to keep their hemoglobin A1c levels as low as possible, preferably near or below 6.0.   The long term benefits of strict control of diabetes are far reaching and critically important for your overall health and survival. Continue to avoid any heavy lifting or strenuous use of your arms or shoulders for at least a total of three months from the time of surgery.  After three months you  may gradually increase how much you lift or otherwise use your arms or chest as tolerated, with limits based upon whether or not activities lead to the return of significant discomfort.    Nani Skillern, PA-C [facility] [phone]

## 2022-05-08 ENCOUNTER — Encounter: Payer: Self-pay | Admitting: Physician Assistant

## 2022-05-08 ENCOUNTER — Ambulatory Visit (INDEPENDENT_AMBULATORY_CARE_PROVIDER_SITE_OTHER): Payer: Managed Care, Other (non HMO) | Admitting: Physician Assistant

## 2022-05-08 VITALS — BP 108/65 | HR 77 | Resp 20 | Ht 66.0 in | Wt 165.0 lb

## 2022-05-08 DIAGNOSIS — I2510 Atherosclerotic heart disease of native coronary artery without angina pectoris: Secondary | ICD-10-CM

## 2022-05-08 DIAGNOSIS — Z951 Presence of aortocoronary bypass graft: Secondary | ICD-10-CM

## 2022-05-08 NOTE — Patient Instructions (Addendum)
You are encouraged to enroll and participate in the outpatient cardiac rehab program beginning as soon as practical. 2. He is already driving (as not taking narcotics) 3. Make every effort to keep your diabetes under very tight control.  Follow up closely with your primary care physician or endocrinologist and strive to keep their hemoglobin A1c levels as low as possible, preferably near or below 6.0.   The long term benefits of strict control of diabetes are far reaching and critically important for your overall health and survival. 4. Continue to avoid any heavy lifting or strenuous use of your arms or shoulders for at least a total of three months from the time of surgery.  After three months you may gradually increase how much you lift or otherwise use your arms or chest as tolerated, with limits based upon whether or not activities lead to the return of significant discomfort

## 2022-05-12 ENCOUNTER — Other Ambulatory Visit (HOSPITAL_COMMUNITY): Payer: Self-pay

## 2022-07-29 ENCOUNTER — Ambulatory Visit: Payer: Self-pay | Admitting: Cardiology

## 2023-01-05 ENCOUNTER — Other Ambulatory Visit: Payer: Self-pay | Admitting: *Deleted

## 2023-01-05 DIAGNOSIS — I6523 Occlusion and stenosis of bilateral carotid arteries: Secondary | ICD-10-CM

## 2023-02-06 ENCOUNTER — Other Ambulatory Visit: Payer: Self-pay | Admitting: Cardiology

## 2023-02-24 ENCOUNTER — Ambulatory Visit (HOSPITAL_COMMUNITY)
Admission: RE | Admit: 2023-02-24 | Payer: Self-pay | Source: Ambulatory Visit | Attending: Cardiology | Admitting: Cardiology
